# Patient Record
Sex: Female | Born: 1955 | Race: White | Hispanic: No | Marital: Married | Smoking: Never smoker
Health system: Southern US, Community
[De-identification: ages and names within clinical notes are randomized; demographics above are authoritative.]

## PROBLEM LIST (undated history)

## (undated) DIAGNOSIS — D649 Anemia, unspecified: Secondary | ICD-10-CM

## (undated) DIAGNOSIS — I10 Essential (primary) hypertension: Secondary | ICD-10-CM

## (undated) DIAGNOSIS — A159 Respiratory tuberculosis unspecified: Secondary | ICD-10-CM

## (undated) DIAGNOSIS — E119 Type 2 diabetes mellitus without complications: Secondary | ICD-10-CM

## (undated) DIAGNOSIS — R7989 Other specified abnormal findings of blood chemistry: Secondary | ICD-10-CM

## (undated) DIAGNOSIS — F329 Major depressive disorder, single episode, unspecified: Secondary | ICD-10-CM

## (undated) DIAGNOSIS — F32A Depression, unspecified: Secondary | ICD-10-CM

## (undated) DIAGNOSIS — G43909 Migraine, unspecified, not intractable, without status migrainosus: Secondary | ICD-10-CM

## (undated) DIAGNOSIS — T7840XA Allergy, unspecified, initial encounter: Secondary | ICD-10-CM

## (undated) DIAGNOSIS — E7800 Pure hypercholesterolemia, unspecified: Secondary | ICD-10-CM

## (undated) DIAGNOSIS — J329 Chronic sinusitis, unspecified: Secondary | ICD-10-CM

## (undated) DIAGNOSIS — M1990 Unspecified osteoarthritis, unspecified site: Secondary | ICD-10-CM

## (undated) DIAGNOSIS — K760 Fatty (change of) liver, not elsewhere classified: Secondary | ICD-10-CM

## (undated) DIAGNOSIS — G709 Myoneural disorder, unspecified: Secondary | ICD-10-CM

## (undated) DIAGNOSIS — R945 Abnormal results of liver function studies: Secondary | ICD-10-CM

## (undated) HISTORY — DX: Essential (primary) hypertension: I10

## (undated) HISTORY — DX: Abnormal results of liver function studies: R94.5

## (undated) HISTORY — DX: Major depressive disorder, single episode, unspecified: F32.9

## (undated) HISTORY — PX: COSMETIC SURGERY: SHX468

## (undated) HISTORY — PX: BILATERAL CARPAL TUNNEL RELEASE: SHX6508

## (undated) HISTORY — DX: Chronic sinusitis, unspecified: J32.9

## (undated) HISTORY — DX: Allergy, unspecified, initial encounter: T78.40XA

## (undated) HISTORY — DX: Pure hypercholesterolemia, unspecified: E78.00

## (undated) HISTORY — PX: RHINOPLASTY: SUR1284

## (undated) HISTORY — DX: Depression, unspecified: F32.A

## (undated) HISTORY — DX: Myoneural disorder, unspecified: G70.9

## (undated) HISTORY — DX: Migraine, unspecified, not intractable, without status migrainosus: G43.909

## (undated) HISTORY — DX: Other specified abnormal findings of blood chemistry: R79.89

## (undated) HISTORY — DX: Type 2 diabetes mellitus without complications: E11.9

---

## 1995-04-01 HISTORY — PX: TUBAL LIGATION: SHX77

## 2002-03-31 HISTORY — PX: BREAST EXCISIONAL BIOPSY: SUR124

## 2002-03-31 HISTORY — PX: BREAST BIOPSY: SHX20

## 2004-01-15 ENCOUNTER — Ambulatory Visit: Payer: Self-pay | Admitting: Internal Medicine

## 2004-07-17 ENCOUNTER — Ambulatory Visit: Payer: Self-pay | Admitting: Internal Medicine

## 2004-08-29 ENCOUNTER — Ambulatory Visit: Payer: Self-pay | Admitting: Surgery

## 2005-01-15 ENCOUNTER — Ambulatory Visit: Payer: Self-pay | Admitting: Internal Medicine

## 2006-01-19 ENCOUNTER — Ambulatory Visit: Payer: Self-pay | Admitting: Internal Medicine

## 2006-04-18 ENCOUNTER — Ambulatory Visit: Payer: Self-pay | Admitting: Gastroenterology

## 2006-08-05 ENCOUNTER — Ambulatory Visit: Payer: Self-pay | Admitting: Internal Medicine

## 2006-08-26 ENCOUNTER — Ambulatory Visit: Payer: Self-pay | Admitting: Internal Medicine

## 2007-01-27 ENCOUNTER — Ambulatory Visit: Payer: Self-pay | Admitting: Internal Medicine

## 2008-01-28 ENCOUNTER — Ambulatory Visit: Payer: Self-pay | Admitting: Internal Medicine

## 2008-06-12 ENCOUNTER — Ambulatory Visit: Payer: Self-pay | Admitting: Internal Medicine

## 2009-02-08 ENCOUNTER — Ambulatory Visit: Payer: Self-pay | Admitting: Internal Medicine

## 2009-04-27 ENCOUNTER — Ambulatory Visit: Payer: Self-pay | Admitting: Physician Assistant

## 2009-05-17 ENCOUNTER — Encounter: Payer: Self-pay | Admitting: General Practice

## 2009-05-29 ENCOUNTER — Encounter: Payer: Self-pay | Admitting: General Practice

## 2009-06-29 ENCOUNTER — Encounter: Payer: Self-pay | Admitting: General Practice

## 2010-02-11 ENCOUNTER — Ambulatory Visit: Payer: Self-pay | Admitting: Internal Medicine

## 2010-06-17 ENCOUNTER — Ambulatory Visit: Payer: Self-pay | Admitting: General Practice

## 2010-12-11 ENCOUNTER — Ambulatory Visit: Payer: Self-pay | Admitting: Internal Medicine

## 2010-12-25 ENCOUNTER — Ambulatory Visit: Payer: Self-pay | Admitting: Internal Medicine

## 2011-02-19 ENCOUNTER — Ambulatory Visit: Payer: Self-pay | Admitting: Internal Medicine

## 2011-05-12 ENCOUNTER — Ambulatory Visit: Payer: Self-pay | Admitting: General Practice

## 2011-05-30 ENCOUNTER — Ambulatory Visit: Payer: Self-pay | Admitting: General Practice

## 2011-06-30 ENCOUNTER — Ambulatory Visit: Payer: Self-pay | Admitting: General Practice

## 2012-03-18 ENCOUNTER — Encounter: Payer: Self-pay | Admitting: Internal Medicine

## 2012-03-18 ENCOUNTER — Telehealth: Payer: Self-pay | Admitting: Internal Medicine

## 2012-03-18 ENCOUNTER — Ambulatory Visit: Payer: PRIVATE HEALTH INSURANCE | Admitting: Internal Medicine

## 2012-03-18 DIAGNOSIS — E7800 Pure hypercholesterolemia, unspecified: Secondary | ICD-10-CM | POA: Insufficient documentation

## 2012-03-18 DIAGNOSIS — R945 Abnormal results of liver function studies: Secondary | ICD-10-CM | POA: Insufficient documentation

## 2012-03-18 DIAGNOSIS — I10 Essential (primary) hypertension: Secondary | ICD-10-CM | POA: Insufficient documentation

## 2012-03-18 DIAGNOSIS — Z139 Encounter for screening, unspecified: Secondary | ICD-10-CM

## 2012-03-18 DIAGNOSIS — G43909 Migraine, unspecified, not intractable, without status migrainosus: Secondary | ICD-10-CM | POA: Insufficient documentation

## 2012-03-18 NOTE — Telephone Encounter (Signed)
Needing an order for a mammogram. °

## 2012-03-18 NOTE — Progress Notes (Signed)
  Subjective:    [name], female    DOB: Dec 13, 1955, 56 y.o.   MRN: [id number]  HPI   Review of Systems     Objective:   Physical Exam        Assessment & Plan:  Chart opened only for abstraction.

## 2012-03-18 NOTE — Assessment & Plan Note (Signed)
Evaluated by Dr Bluford Kaufmann.  Ultrasound with probable fatty liver.  CT with fatty liver.

## 2012-03-18 NOTE — Assessment & Plan Note (Signed)
Stable.  Takes Axert prn.

## 2012-03-19 NOTE — Telephone Encounter (Signed)
mammo ordered.

## 2012-03-22 ENCOUNTER — Ambulatory Visit: Payer: Self-pay | Admitting: Internal Medicine

## 2012-04-06 ENCOUNTER — Encounter: Payer: Self-pay | Admitting: Internal Medicine

## 2012-04-06 ENCOUNTER — Ambulatory Visit (INDEPENDENT_AMBULATORY_CARE_PROVIDER_SITE_OTHER): Payer: 59 | Admitting: Internal Medicine

## 2012-04-06 VITALS — BP 110/78 | HR 82 | Temp 98.0°F | Ht 62.25 in | Wt 178.5 lb

## 2012-04-06 DIAGNOSIS — E7800 Pure hypercholesterolemia, unspecified: Secondary | ICD-10-CM

## 2012-04-06 DIAGNOSIS — I10 Essential (primary) hypertension: Secondary | ICD-10-CM

## 2012-04-06 DIAGNOSIS — R945 Abnormal results of liver function studies: Secondary | ICD-10-CM

## 2012-04-06 DIAGNOSIS — G43909 Migraine, unspecified, not intractable, without status migrainosus: Secondary | ICD-10-CM

## 2012-04-06 DIAGNOSIS — R7989 Other specified abnormal findings of blood chemistry: Secondary | ICD-10-CM

## 2012-04-06 DIAGNOSIS — R5383 Other fatigue: Secondary | ICD-10-CM

## 2012-04-07 LAB — CBC WITH DIFFERENTIAL/PLATELET
Basophils Relative: 0.4 % (ref 0.0–3.0)
Eosinophils Absolute: 0.2 10*3/uL (ref 0.0–0.7)
Eosinophils Relative: 3.7 % (ref 0.0–5.0)
HCT: 34.8 % — ABNORMAL LOW (ref 36.0–46.0)
Lymphs Abs: 1.8 10*3/uL (ref 0.7–4.0)
MCHC: 34.5 g/dL (ref 30.0–36.0)
MCV: 86.6 fl (ref 78.0–100.0)
Monocytes Absolute: 0.6 10*3/uL (ref 0.1–1.0)
Neutrophils Relative %: 53.4 % (ref 43.0–77.0)
RBC: 4.01 Mil/uL (ref 3.87–5.11)
WBC: 5.6 10*3/uL (ref 4.5–10.5)

## 2012-04-07 LAB — HEPATIC FUNCTION PANEL
AST: 92 U/L — ABNORMAL HIGH (ref 0–37)
Albumin: 4 g/dL (ref 3.5–5.2)
Total Bilirubin: 1 mg/dL (ref 0.3–1.2)

## 2012-04-07 LAB — BASIC METABOLIC PANEL
CO2: 26 mEq/L (ref 19–32)
Calcium: 9.7 mg/dL (ref 8.4–10.5)
Chloride: 105 mEq/L (ref 96–112)
Glucose, Bld: 89 mg/dL (ref 70–99)
Sodium: 139 mEq/L (ref 135–145)

## 2012-04-07 LAB — LIPID PANEL
Cholesterol: 205 mg/dL — ABNORMAL HIGH (ref 0–200)
HDL: 33 mg/dL — ABNORMAL LOW (ref >39.00)
Total CHOL/HDL Ratio: 6
Triglycerides: 218 mg/dL — ABNORMAL HIGH (ref 0.0–149.0)
VLDL: 43.6 mg/dL — ABNORMAL HIGH (ref 0.0–40.0)

## 2012-04-07 LAB — LDL CHOLESTEROL, DIRECT: Direct LDL: 117.9 mg/dL

## 2012-04-08 ENCOUNTER — Telehealth: Payer: Self-pay | Admitting: Internal Medicine

## 2012-04-08 NOTE — Telephone Encounter (Signed)
Pt says her pharmacy still has not received her rx.

## 2012-04-09 ENCOUNTER — Other Ambulatory Visit: Payer: Self-pay | Admitting: *Deleted

## 2012-04-09 MED ORDER — AMLODIPINE BESYLATE 10 MG PO TABS: 10.0000 mg | ORAL_TABLET | Freq: Every day | ORAL | Status: DC

## 2012-04-09 MED ORDER — LISINOPRIL 40 MG PO TABS: 40.0000 mg | ORAL_TABLET | Freq: Every day | ORAL | Status: DC

## 2012-04-09 MED ORDER — HYDROCHLOROTHIAZIDE 25 MG PO TABS: 25.0000 mg | ORAL_TABLET | Freq: Every day | ORAL | Status: DC

## 2012-04-09 MED ORDER — PAROXETINE HCL 10 MG PO TABS: 10.0000 mg | ORAL_TABLET | Freq: Every day | ORAL | Status: DC

## 2012-04-09 NOTE — Telephone Encounter (Signed)
Scripts filled.

## 2012-04-17 ENCOUNTER — Encounter: Payer: Self-pay | Admitting: Internal Medicine

## 2012-04-17 NOTE — Assessment & Plan Note (Signed)
On lovaza.  Check lipid panel.  Low cholesterol/low fat diet.  Follow.   

## 2012-04-17 NOTE — Assessment & Plan Note (Signed)
Takes Axert.  Stable.

## 2012-04-17 NOTE — Assessment & Plan Note (Signed)
Worked up by Dr Bluford Kaufmann.  See his notes for details.  Ultrasound revealed probable fatty liver.  CT with fatty liver.  Discussed need for diet, exercise and weight loss.  Continue follow up with GI.  Continue Lovaza for now.  Recheck liver panel.

## 2012-04-17 NOTE — Assessment & Plan Note (Signed)
Blood pressure has been under good control.  Same meds.  Check metabolic panel.   

## 2012-04-17 NOTE — Progress Notes (Signed)
  Subjective:    [name], female    DOB: 1955/09/15, 57 y.o.   MRN: [id number]  HPI 57 year old female with past history of hypertension, hypercholesterolemia, reoccurring allergy and sinus problems and abnormal liver function who comes in today for a scheduled follow up.  She states she has been doing well.  Handling stress well.  No cardiac symptoms with increased activity or exertion.  Breathing stable.  Bowels stable.  States her blood pressure has been doing well.    Past Medical History  Diagnosis Date  . Hypertension   . Hypercholesterolemia   . Migraine headache   . Recurrent sinusitis     allergies  . Abnormal liver function tests     Current Outpatient Prescriptions on File Prior to Visit  Medication Sig Dispense Refill  . fexofenadine (ALLEGRA) 180 MG tablet Take 180 mg by mouth as needed.      . fluticasone (FLONASE) 50 MCG/ACT nasal spray Place 2 sprays into the nose daily.      Marland Kitchen omega-3 acid ethyl esters (LOVAZA) 1 G capsule Take 2 g by mouth 4 (four) times daily.      Marland Kitchen amLODipine (NORVASC) 10 MG tablet Take 1 tablet (10 mg total) by mouth daily.  30 tablet  6  . hydrochlorothiazide (HYDRODIURIL) 25 MG tablet Take 1 tablet (25 mg total) by mouth daily.  30 tablet  6  . lisinopril (PRINIVIL,ZESTRIL) 40 MG tablet Take 1 tablet (40 mg total) by mouth daily.  30 tablet  6  . PARoxetine (PAXIL) 10 MG tablet Take 1 tablet (10 mg total) by mouth daily.  30 tablet  3    Review of Systems Patient denies any headache, lightheadedness or dizziness.  No significant sinus or allergy symptoms currently.  No chest pain, tightness or palpitations.  No increased shortness of breath, cough or congestion.  No nausea or vomiting.  No abdominal pain or cramping.  No bowel change, such as diarrhea, constipation, BRBPR or melana.  No urine change.  Overall she feel she is doing well.  Handling stress well.       Objective:   Physical Exam Filed Vitals:   04/06/12 1617  BP:  110/78  Pulse: 82  Temp: 98 F (36.7 C)   Blood pressure recheck:  114/78, pulse 2  57 year old female in no acute distress.   HEENT:  Nares - clear.  OP- without lesions or erythema.  NECK:  Supple, nontender.  No audible bruit.   HEART:  Appears to be regular. LUNGS:  Without crackles or wheezing audible.  Respirations even and unlabored.   RADIAL PULSE:  Equal bilaterally.  ABDOMEN:  Soft, nontender.  No audible abdominal bruit.   EXTREMITIES:  No increased edema to be present.                  Assessment & Plan:  INCREASED PSYCHOSOCIAL STRESSORS.  Doing well.  On Paxil.  Follow.    CARDIOVASCULAR.  Continue risk factor modification.  Asymptomatic.   HEALTH MAINTENANCE.  Physical 04/23/11.  Mammogram 02/19/11 - BiRADS II.  Was scheduled a follow up mammogram.  Colonoscopy 1/08 - normal.  Hemoccult cards 05/12/11 - negative.

## 2012-04-19 ENCOUNTER — Other Ambulatory Visit: Payer: Self-pay | Admitting: Internal Medicine

## 2012-04-19 DIAGNOSIS — R945 Abnormal results of liver function studies: Secondary | ICD-10-CM

## 2012-04-19 NOTE — Progress Notes (Signed)
Order placed for referral to gastro

## 2012-04-27 ENCOUNTER — Encounter: Payer: Self-pay | Admitting: Internal Medicine

## 2012-05-17 ENCOUNTER — Ambulatory Visit: Payer: Self-pay | Admitting: Gastroenterology

## 2012-07-12 ENCOUNTER — Ambulatory Visit (INDEPENDENT_AMBULATORY_CARE_PROVIDER_SITE_OTHER): Payer: 59 | Admitting: Internal Medicine

## 2012-07-12 ENCOUNTER — Encounter: Payer: Self-pay | Admitting: Internal Medicine

## 2012-07-12 ENCOUNTER — Other Ambulatory Visit (HOSPITAL_COMMUNITY)
Admission: RE | Admit: 2012-07-12 | Discharge: 2012-07-12 | Disposition: A | Discharge status: Home / Self Care | Payer: 59 | Source: Ambulatory Visit | Attending: Internal Medicine | Admitting: Internal Medicine

## 2012-07-12 VITALS — BP 120/80 | HR 76 | Temp 98.4°F | Ht 62.25 in | Wt 182.5 lb

## 2012-07-12 DIAGNOSIS — Z124 Encounter for screening for malignant neoplasm of cervix: Secondary | ICD-10-CM

## 2012-07-12 DIAGNOSIS — G43909 Migraine, unspecified, not intractable, without status migrainosus: Secondary | ICD-10-CM

## 2012-07-12 DIAGNOSIS — Z01419 Encounter for gynecological examination (general) (routine) without abnormal findings: Secondary | ICD-10-CM | POA: Insufficient documentation

## 2012-07-12 DIAGNOSIS — E7800 Pure hypercholesterolemia, unspecified: Secondary | ICD-10-CM

## 2012-07-12 DIAGNOSIS — R7989 Other specified abnormal findings of blood chemistry: Secondary | ICD-10-CM

## 2012-07-12 DIAGNOSIS — N950 Postmenopausal bleeding: Secondary | ICD-10-CM

## 2012-07-12 DIAGNOSIS — R945 Abnormal results of liver function studies: Secondary | ICD-10-CM

## 2012-07-12 DIAGNOSIS — Z1151 Encounter for screening for human papillomavirus (HPV): Secondary | ICD-10-CM | POA: Insufficient documentation

## 2012-07-12 DIAGNOSIS — I10 Essential (primary) hypertension: Secondary | ICD-10-CM

## 2012-07-13 ENCOUNTER — Encounter: Payer: Self-pay | Admitting: Internal Medicine

## 2012-07-13 DIAGNOSIS — N950 Postmenopausal bleeding: Secondary | ICD-10-CM | POA: Insufficient documentation

## 2012-07-13 NOTE — Assessment & Plan Note (Signed)
Blood pressure has been under good control.  Same meds.  Check metabolic panel.   

## 2012-07-13 NOTE — Assessment & Plan Note (Signed)
Stable.  Takes Axert prn.

## 2012-07-13 NOTE — Assessment & Plan Note (Signed)
Worked up by Dr Bluford Kaufmann.  See his notes for details.  Ultrasound revealed probable fatty liver.  CT with fatty liver.  Discussed need for diet, exercise and weight loss.  Continue follow up with GI.  Continue Lovaza for now.  Recheck liver panel.

## 2012-07-13 NOTE — Assessment & Plan Note (Signed)
On lovaza.  Check lipid panel.  Low cholesterol/low fat diet.  Follow.   

## 2012-07-13 NOTE — Assessment & Plan Note (Signed)
Bleeding as outlined.  Had been more than one year since a period.  Had a light period which lasted approximately four days.  Will refer to GYN for evaluation.  Appointment with Dr Janene Harvey.

## 2012-07-13 NOTE — Progress Notes (Signed)
Subjective:    [name], female    DOB: 08-15-1955, 57 y.o.   MRN: [id number]  HPI 57 year old female with past history of hypertension, hypercholesterolemia, reoccurring allergy and sinus problems and abnormal liver function who comes in today to follow up on these issues as well as for a complete physical exam.  She states she has been doing well.  Handling stress well.  No cardiac symptoms with increased activity or exertion.  Breathing stable.  Bowels stable.  States her blood pressure has been doing well - 110-120/70-80.  She reports that it had been more than one year since she had a period.  Approximately 1 1/2 weeks ago, she had a light period that lasted approximately four days.  No bleeding since.  Just saw GI 2/14. Had follow up ultrasound.  States stable.  Recommended continuing to follow.    Past Medical History  Diagnosis Date  . Hypertension   . Hypercholesterolemia   . Migraine headache   . Recurrent sinusitis     allergies  . Abnormal liver function tests     Current Outpatient Prescriptions on File Prior to Visit  Medication Sig Dispense Refill  . amLODipine (NORVASC) 10 MG tablet Take 1 tablet (10 mg total) by mouth daily.  30 tablet  6  . fluticasone (FLONASE) 50 MCG/ACT nasal spray Place 2 sprays into the nose daily.      . hydrochlorothiazide (HYDRODIURIL) 25 MG tablet Take 1 tablet (25 mg total) by mouth daily.  30 tablet  6  . lisinopril (PRINIVIL,ZESTRIL) 40 MG tablet Take 1 tablet (40 mg total) by mouth daily.  30 tablet  6  . omega-3 acid ethyl esters (LOVAZA) 1 G capsule Take 2 g by mouth 4 (four) times daily.      Marland Kitchen PARoxetine (PAXIL) 10 MG tablet Take 1 tablet (10 mg total) by mouth daily.  30 tablet  3  . fexofenadine (ALLEGRA) 180 MG tablet Take 180 mg by mouth as needed.       No current facility-administered medications on file prior to visit.    Review of Systems Patient denies any headache, lightheadedness or dizziness.  No  significant sinus or allergy symptoms currently.  No chest pain, tightness or palpitations.  No increased shortness of breath, cough or congestion.  No nausea or vomiting.  No abdominal pain or cramping.  No bowel change, such as diarrhea, constipation, BRBPR or melana.  No urine change.  Overall she feel she is doing well.  Handling stress well.  Does report the vaginal bleeding as outlined.       Objective:   Physical Exam  Filed Vitals:   07/12/12 1035  BP: 120/80  Pulse: 76  Temp: 98.4 F (36.9 C)   Blood pressure recheck:  116/78, pulse 86  57 year old female in no acute distress.   HEENT:  Nares- clear.  Oropharynx - without lesions. NECK:  Supple.  Nontender.  No audible bruit.  HEART:  Appears to be regular. LUNGS:  No crackles or wheezing audible.  Respirations even and unlabored.  RADIAL PULSE:  Equal bilaterally.    BREASTS:  No nipple discharge or nipple retraction present.  Could not appreciate any distinct nodules or axillary adenopathy.  ABDOMEN:  Soft, nontender.  Bowel sounds present and normal.  No audible abdominal bruit.  GU:  Normal external genitalia.  Vaginal vault without lesions.  Cervix identified.  Pap performed. Some minimal bleeding with the pap.  Could not  appreciate any adnexal masses or tenderness.   RECTAL:  No masses palpated.    EXTREMITIES:  No increased edema present.  DP pulses palpable and equal bilaterally.          Assessment & Plan:  INCREASED PSYCHOSOCIAL STRESSORS.  Doing well.  On Paxil.  Follow.    CARDIOVASCULAR.  Continue risk factor modification.  Asymptomatic.   HEALTH MAINTENANCE.  Physical today.  Mammogram 03/22/12 - BiRADS II.  Colonoscopy 1/08 - normal.

## 2012-07-19 ENCOUNTER — Encounter: Payer: Self-pay | Admitting: Internal Medicine

## 2012-08-09 ENCOUNTER — Other Ambulatory Visit (INDEPENDENT_AMBULATORY_CARE_PROVIDER_SITE_OTHER): Payer: 59

## 2012-08-09 DIAGNOSIS — I10 Essential (primary) hypertension: Secondary | ICD-10-CM

## 2012-08-09 DIAGNOSIS — E7800 Pure hypercholesterolemia, unspecified: Secondary | ICD-10-CM

## 2012-08-09 DIAGNOSIS — R945 Abnormal results of liver function studies: Secondary | ICD-10-CM

## 2012-08-09 DIAGNOSIS — R7989 Other specified abnormal findings of blood chemistry: Secondary | ICD-10-CM

## 2012-08-09 LAB — HEPATIC FUNCTION PANEL
Albumin: 4.2 g/dL (ref 3.5–5.2)
Alkaline Phosphatase: 60 U/L (ref 39–117)
Total Bilirubin: 0.4 mg/dL (ref 0.3–1.2)

## 2012-08-09 LAB — BASIC METABOLIC PANEL
BUN: 17 mg/dL (ref 6–23)
CO2: 26 mEq/L (ref 19–32)
Chloride: 105 mEq/L (ref 96–112)
Glucose, Bld: 109 mg/dL — ABNORMAL HIGH (ref 70–99)
Potassium: 4.2 mEq/L (ref 3.5–5.1)

## 2012-08-09 LAB — LIPID PANEL
Cholesterol: 199 mg/dL (ref 0–200)
Total CHOL/HDL Ratio: 6
VLDL: 66 mg/dL — ABNORMAL HIGH (ref 0.0–40.0)

## 2012-08-12 ENCOUNTER — Telehealth: Payer: Self-pay | Admitting: Internal Medicine

## 2012-08-12 ENCOUNTER — Encounter: Payer: Self-pay | Admitting: Internal Medicine

## 2012-08-12 NOTE — Telephone Encounter (Signed)
Pt was notified of lab results via my chart.  She needs a fasting lab appt within the next few weeks.  Please schedule her for a fasting lab in a few weeks and call her with an appt date and time.  Thanks.

## 2012-08-13 ENCOUNTER — Telehealth: Payer: Self-pay | Admitting: *Deleted

## 2012-08-13 DIAGNOSIS — R739 Hyperglycemia, unspecified: Secondary | ICD-10-CM

## 2012-08-13 NOTE — Telephone Encounter (Signed)
Patient scheduled for fasting labs 5/19

## 2012-08-13 NOTE — Telephone Encounter (Signed)
Left message for pt to call office for pt to call back

## 2012-08-13 NOTE — Telephone Encounter (Signed)
Pt is coming in for labs Monday 05.19.2014 what labs and dx?   

## 2012-08-13 NOTE — Telephone Encounter (Signed)
Ordered a1c and fasting glucose

## 2012-08-16 ENCOUNTER — Other Ambulatory Visit: Payer: 59

## 2012-08-19 ENCOUNTER — Other Ambulatory Visit (INDEPENDENT_AMBULATORY_CARE_PROVIDER_SITE_OTHER): Payer: 59

## 2012-08-19 DIAGNOSIS — R739 Hyperglycemia, unspecified: Secondary | ICD-10-CM

## 2012-08-19 DIAGNOSIS — R7309 Other abnormal glucose: Secondary | ICD-10-CM

## 2012-08-22 ENCOUNTER — Encounter: Payer: Self-pay | Admitting: Internal Medicine

## 2012-08-24 ENCOUNTER — Telehealth: Payer: Self-pay

## 2012-08-24 NOTE — Telephone Encounter (Signed)
MyChart Message: I reviewed your recent lab results. Your sugar and the overall sugar control (a1c) - are ok. Continue low carb diet and exercise.  Notified patient of her lab results.

## 2012-09-10 ENCOUNTER — Telehealth: Payer: Self-pay | Admitting: *Deleted

## 2012-09-10 MED ORDER — OMEGA-3-ACID ETHYL ESTERS 1 G PO CAPS: 1.0000 g | ORAL_CAPSULE | Freq: Four times a day (QID) | ORAL | Status: DC

## 2012-09-10 NOTE — Telephone Encounter (Signed)
Patient called stating needs refill on Lovaza from historical provider please advise as to refill.

## 2012-09-10 NOTE — Telephone Encounter (Signed)
Refilled lovaza x 5 months.

## 2012-11-15 ENCOUNTER — Ambulatory Visit (INDEPENDENT_AMBULATORY_CARE_PROVIDER_SITE_OTHER): Payer: 59 | Admitting: Internal Medicine

## 2012-11-15 ENCOUNTER — Encounter: Payer: Self-pay | Admitting: Internal Medicine

## 2012-11-15 VITALS — BP 130/80 | HR 81 | Temp 98.4°F | Ht 62.25 in | Wt 182.8 lb

## 2012-11-15 DIAGNOSIS — I10 Essential (primary) hypertension: Secondary | ICD-10-CM

## 2012-11-15 DIAGNOSIS — R945 Abnormal results of liver function studies: Secondary | ICD-10-CM

## 2012-11-15 DIAGNOSIS — N950 Postmenopausal bleeding: Secondary | ICD-10-CM

## 2012-11-15 DIAGNOSIS — R739 Hyperglycemia, unspecified: Secondary | ICD-10-CM

## 2012-11-15 DIAGNOSIS — R7309 Other abnormal glucose: Secondary | ICD-10-CM

## 2012-11-15 DIAGNOSIS — G43909 Migraine, unspecified, not intractable, without status migrainosus: Secondary | ICD-10-CM

## 2012-11-15 DIAGNOSIS — R7989 Other specified abnormal findings of blood chemistry: Secondary | ICD-10-CM

## 2012-11-15 DIAGNOSIS — E7800 Pure hypercholesterolemia, unspecified: Secondary | ICD-10-CM

## 2012-11-15 LAB — BASIC METABOLIC PANEL
BUN: 20 mg/dL (ref 6–23)
CO2: 23 mEq/L (ref 19–32)
Calcium: 9.4 mg/dL (ref 8.4–10.5)
Creatinine, Ser: 0.8 mg/dL (ref 0.4–1.2)
Glucose, Bld: 99 mg/dL (ref 70–99)

## 2012-11-15 LAB — HEMOGLOBIN A1C: Hgb A1c MFr Bld: 5.8 % (ref 4.6–6.5)

## 2012-11-15 LAB — LIPID PANEL: Total CHOL/HDL Ratio: 6

## 2012-11-15 NOTE — Assessment & Plan Note (Addendum)
Stable

## 2012-11-15 NOTE — Assessment & Plan Note (Signed)
Blood pressure has been under good control.  Same meds.  Follow metabolic panel.   

## 2012-11-15 NOTE — Progress Notes (Signed)
Subjective:    [name], female    DOB: 06/28/1955, 57 y.o.   MRN: [id number]  HPI 57 year old female with past history of hypertension, hypercholesterolemia, reoccurring allergy and sinus problems and abnormal liver function who comes in today for a scheduled follow up.  She states she has been doing well.  Handling stress well.  No cardiac symptoms with increased activity or exertion.  Breathing stable.  Bowels stable.  States her blood pressure has been doing well - 120-130s/70-80s.   Saw GI 2/14. Had follow up ultrasound.  States stable.  Recommended continuing to follow.  Just had liver panel checked through GI last week.  She did see gyn for post menopausal bleeding.  See last note for details.  Had a polyp removed.  Has had no further bleeding.     Past Medical History  Diagnosis Date  . Hypertension   . Hypercholesterolemia   . Migraine headache   . Recurrent sinusitis     allergies  . Abnormal liver function tests     Current Outpatient Prescriptions on File Prior to Visit  Medication Sig Dispense Refill  . amLODipine (NORVASC) 10 MG tablet Take 1 tablet (10 mg total) by mouth daily.  30 tablet  6  . fexofenadine (ALLEGRA) 180 MG tablet Take 180 mg by mouth as needed.      . fluticasone (FLONASE) 50 MCG/ACT nasal spray Place 2 sprays into the nose daily.      . hydrochlorothiazide (HYDRODIURIL) 25 MG tablet Take 1 tablet (25 mg total) by mouth daily.  30 tablet  6  . lisinopril (PRINIVIL,ZESTRIL) 40 MG tablet Take 1 tablet (40 mg total) by mouth daily.  30 tablet  6  . loratadine (CLARITIN) 10 MG tablet Take 10 mg by mouth daily.      Marland Kitchen PARoxetine (PAXIL) 10 MG tablet Take 1 tablet (10 mg total) by mouth daily.  30 tablet  3   No current facility-administered medications on file prior to visit.    Review of Systems Patient denies any headache, lightheadedness or dizziness.  No significant sinus or allergy symptoms currently.  No chest pain, tightness or  palpitations.  No increased shortness of breath, cough or congestion.  No nausea or vomiting.  No abdominal pain or cramping.  No bowel change, such as diarrhea, constipation, BRBPR or melana.  No urine change.  Overall she feel she is doing well.  Handling stress well.  No further vaginal bleeding.       Objective:   Physical Exam  Filed Vitals:   11/15/12 0910  BP: 130/80  Pulse: 81  Temp: 98.4 F (2.60 C)   57 year old female in no acute distress.   HEENT:  Nares- clear.  Oropharynx - without lesions. NECK:  Supple.  Nontender.  No audible bruit.  HEART:  Appears to be regular. LUNGS:  No crackles or wheezing audible.  Respirations even and unlabored.  RADIAL PULSE:  Equal bilaterally.  ABDOMEN:  Soft, nontender.  Bowel sounds present and normal.  No audible abdominal bruit.    EXTREMITIES:  No increased edema present.  DP pulses palpable and equal bilaterally.          Assessment & Plan:  INCREASED PSYCHOSOCIAL STRESSORS.  Doing well.  On Paxil.  Follow.    CARDIOVASCULAR.  Continue risk factor modification.  Asymptomatic.   HEALTH MAINTENANCE.  Physical 07/12/12.  Pap at physical wnl.  Mammogram 03/22/12 - BiRADS II.  Colonoscopy 1/08 -  normal.

## 2012-11-15 NOTE — Assessment & Plan Note (Addendum)
Worked up by Dr Bluford Kaufmann.  See his notes for details.  Ultrasound revealed probable fatty liver.  CT with fatty liver.  Have discussed need for diet, exercise and weight loss.  Continue follow up with GI.  Continue Lovaza for now.  Just had liver panel checked last week.  Obtain results.

## 2012-11-15 NOTE — Assessment & Plan Note (Signed)
On lovaza.  Check lipid panel.  Low cholesterol/low fat diet.  Follow.   

## 2012-11-15 NOTE — Assessment & Plan Note (Addendum)
Saw Dr Janene Harvey.  Had polyp removed.  No further bleeding.  Obtain records.

## 2012-11-16 ENCOUNTER — Encounter: Payer: Self-pay | Admitting: Internal Medicine

## 2012-11-21 ENCOUNTER — Encounter: Payer: Self-pay | Admitting: Internal Medicine

## 2012-11-23 NOTE — Telephone Encounter (Signed)
Mailed unread message to pt  

## 2012-12-16 ENCOUNTER — Ambulatory Visit: Payer: Self-pay | Admitting: Gastroenterology

## 2013-01-24 ENCOUNTER — Other Ambulatory Visit: Payer: Self-pay | Admitting: *Deleted

## 2013-01-24 ENCOUNTER — Encounter: Payer: Self-pay | Admitting: Internal Medicine

## 2013-01-24 MED ORDER — LISINOPRIL 40 MG PO TABS: 40.0000 mg | ORAL_TABLET | Freq: Every day | ORAL | Status: DC

## 2013-01-24 MED ORDER — PAROXETINE HCL 10 MG PO TABS: 10.0000 mg | ORAL_TABLET | Freq: Every day | ORAL | Status: DC

## 2013-02-03 ENCOUNTER — Other Ambulatory Visit: Payer: Self-pay

## 2013-02-19 ENCOUNTER — Encounter: Payer: Self-pay | Admitting: Internal Medicine

## 2013-02-23 ENCOUNTER — Encounter: Payer: Self-pay | Admitting: Internal Medicine

## 2013-03-11 ENCOUNTER — Encounter: Payer: Self-pay | Admitting: *Deleted

## 2013-03-14 ENCOUNTER — Ambulatory Visit (INDEPENDENT_AMBULATORY_CARE_PROVIDER_SITE_OTHER): Payer: 59 | Admitting: Internal Medicine

## 2013-03-14 ENCOUNTER — Encounter: Payer: Self-pay | Admitting: Internal Medicine

## 2013-03-14 ENCOUNTER — Other Ambulatory Visit: Payer: Self-pay | Admitting: Internal Medicine

## 2013-03-14 VITALS — BP 130/90 | HR 73 | Temp 98.0°F | Ht 62.25 in | Wt 182.8 lb

## 2013-03-14 DIAGNOSIS — R945 Abnormal results of liver function studies: Secondary | ICD-10-CM

## 2013-03-14 DIAGNOSIS — G43909 Migraine, unspecified, not intractable, without status migrainosus: Secondary | ICD-10-CM

## 2013-03-14 DIAGNOSIS — I10 Essential (primary) hypertension: Secondary | ICD-10-CM

## 2013-03-14 DIAGNOSIS — R739 Hyperglycemia, unspecified: Secondary | ICD-10-CM

## 2013-03-14 DIAGNOSIS — R7989 Other specified abnormal findings of blood chemistry: Secondary | ICD-10-CM

## 2013-03-14 DIAGNOSIS — N950 Postmenopausal bleeding: Secondary | ICD-10-CM

## 2013-03-14 DIAGNOSIS — R7309 Other abnormal glucose: Secondary | ICD-10-CM

## 2013-03-14 DIAGNOSIS — E7800 Pure hypercholesterolemia, unspecified: Secondary | ICD-10-CM

## 2013-03-14 LAB — BASIC METABOLIC PANEL
BUN: 15 mg/dL (ref 6–23)
CO2: 25 mEq/L (ref 19–32)
Calcium: 10 mg/dL (ref 8.4–10.5)
Chloride: 104 mEq/L (ref 96–112)
Glucose, Bld: 97 mg/dL (ref 70–99)
Potassium: 3.8 mEq/L (ref 3.5–5.1)
Sodium: 139 mEq/L (ref 135–145)

## 2013-03-14 LAB — LIPID PANEL
HDL: 37.5 mg/dL — ABNORMAL LOW (ref >39.00)
Triglycerides: 145 mg/dL (ref 0.0–149.0)
VLDL: 29 mg/dL (ref 0.0–40.0)

## 2013-03-14 MED ORDER — LISINOPRIL 40 MG PO TABS: 40.0000 mg | ORAL_TABLET | Freq: Every day | ORAL | Status: DC

## 2013-03-14 MED ORDER — HYDROCHLOROTHIAZIDE 25 MG PO TABS: 25.0000 mg | ORAL_TABLET | Freq: Every day | ORAL | Status: DC

## 2013-03-14 NOTE — Progress Notes (Signed)
Subjective:    [name], female    DOB: [birth year], 57 y.o.   MRN: [id number]  HPI 57 year old female with past history of hypertension, hypercholesterolemia, reoccurring allergy and sinus problems and abnormal liver function who comes in today for a scheduled follow up.  She states she has been doing well.  Handling stress well.  No cardiac symptoms with increased activity or exertion.  Breathing stable.  Bowels stable.  States her blood pressure has been doing well.  Blood pressure averaging 120-130/low 80s.  Just saw GI.  Had follow up labs.  States stable.  Recommended continuing to follow.  Will need f/u ultrasound yearly.  States her last was in 8/14.  She did see gyn for post menopausal bleeding.  See previous notes for details.  Had a polyp removed.  Has had no further bleeding.  Just saw Dr Dorcas Mcmurray secondary to some left eyelid swelling.  He gave her steroid eye drops.  Improved.  Previously had pain in her left thumb.  Took advil for two days.  Resolved.  No problems now.  Overall she feels she is doing well.     Past Medical History  Diagnosis Date  . Hypertension   . Hypercholesterolemia   . Migraine headache   . Recurrent sinusitis     allergies  . Abnormal liver function tests     Current Outpatient Prescriptions on File Prior to Visit  Medication Sig Dispense Refill  . amLODipine (NORVASC) 10 MG tablet Take 1 tablet (10 mg total) by mouth daily.  30 tablet  6  . fexofenadine (ALLEGRA) 180 MG tablet Take 180 mg by mouth as needed.      . fluticasone (FLONASE) 50 MCG/ACT nasal spray Place 2 sprays into the nose daily.      . hydrochlorothiazide (HYDRODIURIL) 25 MG tablet Take 1 tablet (25 mg total) by mouth daily.  30 tablet  6  . lisinopril (PRINIVIL,ZESTRIL) 40 MG tablet Take 1 tablet (40 mg total) by mouth daily.  30 tablet  5  . loratadine (CLARITIN) 10 MG tablet Take 10 mg by mouth daily.      . naproxen sodium (ANAPROX) 220 MG tablet Take 220 mg by  mouth as needed.      Marland Kitchen PARoxetine (PAXIL) 10 MG tablet Take 1 tablet (10 mg total) by mouth daily.  30 tablet  2  . omega-3 acid ethyl esters (LOVAZA) 1 G capsule Take 1 g by mouth 2 (two) times daily.       No current facility-administered medications on file prior to visit.    Review of Systems Patient denies any headache, lightheadedness or dizziness.  No significant sinus or allergy symptoms currently.  No chest pain, tightness or palpitations.  No increased shortness of breath, cough or congestion.  No nausea or vomiting.  No abdominal pain or cramping.  No bowel change, such as diarrhea, constipation, BRBPR or melana.  No urine change.  Overall she feel she is doing well.  Handling stress well.  No further vaginal bleeding.       Objective:   Physical Exam  Filed Vitals:   03/14/13 0904  BP: 130/90  Pulse: 73  Temp: 98 F (36.7 C)   Blood pressure recheck:  59/70  57 year old female in no acute distress.   HEENT:  Nares- clear.  Oropharynx - without lesions. NECK:  Supple.  Nontender.  No audible bruit.  HEART:  Appears to be regular. LUNGS:  No crackles  or wheezing audible.  Respirations even and unlabored.  RADIAL PULSE:  Equal bilaterally.  ABDOMEN:  Soft, nontender.  Bowel sounds present and normal.  No audible abdominal bruit.    EXTREMITIES:  No increased edema present.  DP pulses palpable and equal bilaterally.          Assessment & Plan:  INCREASED PSYCHOSOCIAL STRESSORS.  Doing well.  On Paxil.  Follow.    CARDIOVASCULAR.  Continue risk factor modification.  Asymptomatic.   HEALTH MAINTENANCE.  Physical 07/12/12.  Pap at physical wnl.  Mammogram 03/22/12 - BiRADS II.  She is scheduled to have her mammogram 03/22/13.   Colonoscopy 1/08 - normal.

## 2013-03-14 NOTE — Assessment & Plan Note (Addendum)
Blood pressure has been under good control.  Same meds.  Check metabolic panel.   

## 2013-03-14 NOTE — Progress Notes (Signed)
Orders placed for labs

## 2013-03-14 NOTE — Assessment & Plan Note (Addendum)
On lovaza.  Not taking regularly.  Check lipid panel.  Low cholesterol/low fat diet.  Follow.

## 2013-03-14 NOTE — Assessment & Plan Note (Addendum)
Saw Dr Klett.  Had polyp removed.  No further bleeding.   

## 2013-03-14 NOTE — Assessment & Plan Note (Addendum)
Stable

## 2013-03-14 NOTE — Progress Notes (Signed)
Pre-visit discussion using our clinic review tool. No additional management support is needed unless otherwise documented below in the visit note.  

## 2013-03-14 NOTE — Assessment & Plan Note (Addendum)
Worked up by Dr Bluford Kaufmann.  See his notes for details.  Ultrasound revealed probable fatty liver.  CT with fatty liver.  Discussed need for diet, exercise and weight loss.  Continue follow up with GI.  Just evaluated by GI last week.  Apparently labs stable.  Will need ultrasound yearly.

## 2013-03-16 NOTE — Telephone Encounter (Signed)
Mailed unread message to pt  

## 2013-03-17 ENCOUNTER — Encounter: Payer: Self-pay | Admitting: *Deleted

## 2013-03-23 ENCOUNTER — Ambulatory Visit: Payer: Self-pay | Admitting: Internal Medicine

## 2013-03-23 ENCOUNTER — Encounter: Payer: Self-pay | Admitting: Internal Medicine

## 2013-03-23 LAB — HM MAMMOGRAPHY: HM Mammogram: NEGATIVE

## 2013-04-14 ENCOUNTER — Encounter: Payer: Self-pay | Admitting: Internal Medicine

## 2013-05-03 ENCOUNTER — Other Ambulatory Visit: Payer: Self-pay | Admitting: Internal Medicine

## 2013-05-18 ENCOUNTER — Telehealth: Payer: Self-pay | Admitting: Internal Medicine

## 2013-05-18 NOTE — Telephone Encounter (Signed)
Pt left vm regarding billing issue.  Returned pt call, daughter answered, heard daughter talking to pt.  Pt told daughter to tell me she was not there.  Daughter told me pt was not there.  No further msg left.

## 2013-06-03 ENCOUNTER — Other Ambulatory Visit: Payer: Self-pay | Admitting: Internal Medicine

## 2013-06-17 ENCOUNTER — Telehealth: Payer: Self-pay | Admitting: Internal Medicine

## 2013-06-17 NOTE — Telephone Encounter (Signed)
Reviewed message.  Per note, pt advised - evaluation.  Please f/u with pt and confirm went and ok.  Thanks.

## 2013-06-17 NOTE — Telephone Encounter (Signed)
Left detailed message informing the patient to go to Acute Care, Urgent Care, or ER for treatment of sx's. I also mentioned the Saturday clinic on voicemail.

## 2013-06-17 NOTE — Telephone Encounter (Signed)
Pt left vm asking for appt to see Dr. Nicki Reaper today.  States she was sick on Monday and Tuesday with nausea and vomiting.  Felt better Wednesday.  Had low-grade temp on Thursday.  Appetite came back somewhat last night.  States today she is having indigestion.  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  States she has been able to use the bathroom some.  Is burping a lot.  All info left on vm.  Attempting to contact pt at work#.  No appt available this p.m.

## 2013-06-20 ENCOUNTER — Encounter: Payer: Self-pay | Admitting: *Deleted

## 2013-06-20 NOTE — Telephone Encounter (Signed)
Sent mychart message

## 2013-06-21 NOTE — Telephone Encounter (Signed)
I called patient a left a detailed voicemail asking for an update from Friday

## 2013-07-13 ENCOUNTER — Encounter: Payer: Self-pay | Admitting: Internal Medicine

## 2013-07-13 ENCOUNTER — Ambulatory Visit (INDEPENDENT_AMBULATORY_CARE_PROVIDER_SITE_OTHER): Payer: 59 | Admitting: Internal Medicine

## 2013-07-13 VITALS — BP 110/80 | HR 81 | Temp 98.2°F | Ht 62.0 in | Wt 182.8 lb

## 2013-07-13 DIAGNOSIS — R945 Abnormal results of liver function studies: Secondary | ICD-10-CM

## 2013-07-13 DIAGNOSIS — E7800 Pure hypercholesterolemia, unspecified: Secondary | ICD-10-CM

## 2013-07-13 DIAGNOSIS — Z1211 Encounter for screening for malignant neoplasm of colon: Secondary | ICD-10-CM

## 2013-07-13 DIAGNOSIS — I10 Essential (primary) hypertension: Secondary | ICD-10-CM

## 2013-07-13 DIAGNOSIS — G43909 Migraine, unspecified, not intractable, without status migrainosus: Secondary | ICD-10-CM

## 2013-07-13 DIAGNOSIS — R7309 Other abnormal glucose: Secondary | ICD-10-CM

## 2013-07-13 DIAGNOSIS — R7989 Other specified abnormal findings of blood chemistry: Secondary | ICD-10-CM

## 2013-07-13 DIAGNOSIS — R739 Hyperglycemia, unspecified: Secondary | ICD-10-CM

## 2013-07-13 DIAGNOSIS — N950 Postmenopausal bleeding: Secondary | ICD-10-CM

## 2013-07-13 LAB — CBC WITH DIFFERENTIAL/PLATELET
Basophils Absolute: 0 10*3/uL (ref 0.0–0.1)
Basophils Relative: 0.7 % (ref 0.0–3.0)
EOS PCT: 2.6 % (ref 0.0–5.0)
Eosinophils Absolute: 0.2 10*3/uL (ref 0.0–0.7)
HEMATOCRIT: 36.2 % (ref 36.0–46.0)
Hemoglobin: 12.3 g/dL (ref 12.0–15.0)
Lymphocytes Relative: 48.1 % — ABNORMAL HIGH (ref 12.0–46.0)
Lymphs Abs: 2.9 10*3/uL (ref 0.7–4.0)
MCHC: 34 g/dL (ref 30.0–36.0)
MCV: 88 fl (ref 78.0–100.0)
MONO ABS: 0.5 10*3/uL (ref 0.1–1.0)
Monocytes Relative: 8.7 % (ref 3.0–12.0)
Neutro Abs: 2.4 10*3/uL (ref 1.4–7.7)
Neutrophils Relative %: 39.9 % — ABNORMAL LOW (ref 43.0–77.0)
Platelets: 257 10*3/uL (ref 150.0–400.0)
RBC: 4.12 Mil/uL (ref 3.87–5.11)
RDW: 13.6 % (ref 11.5–14.6)
WBC: 6 10*3/uL (ref 4.5–10.5)

## 2013-07-13 LAB — BASIC METABOLIC PANEL
BUN: 22 mg/dL (ref 6–23)
CALCIUM: 10.6 mg/dL — AB (ref 8.4–10.5)
CO2: 29 mEq/L (ref 19–32)
CREATININE: 0.8 mg/dL (ref 0.4–1.2)
Chloride: 102 mEq/L (ref 96–112)
GFR: 75.32 mL/min (ref >60.00)
Glucose, Bld: 90 mg/dL (ref 70–99)
Potassium: 4.5 mEq/L (ref 3.5–5.1)
Sodium: 138 mEq/L (ref 135–145)

## 2013-07-13 LAB — LIPID PANEL
CHOL/HDL RATIO: 5
Cholesterol: 205 mg/dL — ABNORMAL HIGH (ref 0–200)
HDL: 38.1 mg/dL — AB (ref >39.00)
LDL CALC: 144 mg/dL — AB (ref 0–99)
Triglycerides: 116 mg/dL (ref 0.0–149.0)
VLDL: 23.2 mg/dL (ref 0.0–40.0)

## 2013-07-13 LAB — HEPATIC FUNCTION PANEL
ALBUMIN: 4.6 g/dL (ref 3.5–5.2)
ALT: 62 U/L — ABNORMAL HIGH (ref 0–35)
AST: 41 U/L — ABNORMAL HIGH (ref 0–37)
Alkaline Phosphatase: 77 U/L (ref 39–117)
Bilirubin, Direct: 0.1 mg/dL (ref 0.0–0.3)
Total Bilirubin: 1 mg/dL (ref 0.3–1.2)
Total Protein: 7.7 g/dL (ref 6.0–8.3)

## 2013-07-13 LAB — HEMOGLOBIN A1C: HEMOGLOBIN A1C: 5.3 % (ref 4.6–6.5)

## 2013-07-13 LAB — TSH: TSH: 2.32 u[IU]/mL (ref 0.35–5.50)

## 2013-07-13 NOTE — Progress Notes (Signed)
Subjective:    [name], female    DOB: October 23, 1955, 58 y.o.   MRN: [id number]  HPI 58 year old female with past history of hypertension, hypercholesterolemia, reoccurring allergy and sinus problems and abnormal liver function who comes in today to follow up on these issues as well as for a complete physical exam.   She states she has been doing well.  Handling stress well.  No cardiac symptoms with increased activity or exertion.  Breathing stable.  Bowels stable.  States her blood pressure has been doing well.  Blood pressure averaging 117-130/70- 80s.  Following with GI.   Will need f/u ultrasound yearly.  States her last was in 8/14.  She did see gyn for post menopausal bleeding.  See previous notes for details.  Had a polyp removed.  Has had no further bleeding.      Past Medical History  Diagnosis Date  . Hypertension   . Hypercholesterolemia   . Migraine headache   . Recurrent sinusitis     allergies  . Abnormal liver function tests     Current Outpatient Prescriptions on File Prior to Visit  Medication Sig Dispense Refill  . amLODipine (NORVASC) 10 MG tablet TAKE ONE TABLET BY MOUTH ONCE DAILY  30 tablet  6  . fexofenadine (ALLEGRA) 180 MG tablet Take 180 mg by mouth as needed.      . fluticasone (FLONASE) 50 MCG/ACT nasal spray Place 2 sprays into the nose daily as needed.       . hydrochlorothiazide (HYDRODIURIL) 25 MG tablet Take 1 tablet (25 mg total) by mouth daily.  30 tablet  5  . lisinopril (PRINIVIL,ZESTRIL) 40 MG tablet Take 1 tablet (40 mg total) by mouth daily.  30 tablet  5  . loratadine (CLARITIN) 10 MG tablet Take 10 mg by mouth daily.      . naproxen sodium (ANAPROX) 220 MG tablet Take 220 mg by mouth as needed.      Marland Kitchen omega-3 acid ethyl esters (LOVAZA) 1 G capsule Take 1 g by mouth 2 (two) times daily.      Marland Kitchen PARoxetine (PAXIL) 10 MG tablet Take 1 tablet (10 mg total) by mouth daily.  30 tablet  2   No current facility-administered medications on  file prior to visit.    Review of Systems Patient denies any headache, lightheadedness or dizziness.  No significant sinus or allergy symptoms currently.  No chest pain, tightness or palpitations.  No increased shortness of breath, cough or congestion.  No nausea or vomiting.  No abdominal pain or cramping.  No bowel change, such as diarrhea, constipation, BRBPR or melana.  No urine change.  Overall she feel she is doing well.  Handling stress well.  No further vaginal bleeding.       Objective:   Physical Exam  Filed Vitals:   07/13/13 0944  BP: 110/80  Pulse: 81  Temp: 98.2 F (36.8 C)   Blood pressure recheck:  42/70  58 year old female in no acute distress.   HEENT:  Nares- clear.  Oropharynx - without lesions. NECK:  Supple.  Nontender.  No audible bruit.  HEART:  Appears to be regular. LUNGS:  No crackles or wheezing audible.  Respirations even and unlabored.  RADIAL PULSE:  Equal bilaterally.    BREASTS:  No nipple discharge or nipple retraction present.  Could not appreciate any distinct nodules or axillary adenopathy.  ABDOMEN:  Soft, nontender.  Bowel sounds present and normal.  No audible abdominal bruit.  GU:  Not performed.     EXTREMITIES:  No increased edema present.  DP pulses palpable and equal bilaterally.      FEET:  No lesions.      Assessment & Plan:  INCREASED PSYCHOSOCIAL STRESSORS.  Doing well.  On Paxil.  Follow.    CARDIOVASCULAR.  Continue risk factor modification.  Asymptomatic.   HEALTH MAINTENANCE.  Physical today.  Pap (07/12/12) at physical wnl.  Mammogram 03/23/13 - BiRADS I.  Colonoscopy 1/08 - normal.

## 2013-07-13 NOTE — Progress Notes (Signed)
Pre visit review using our clinic review tool, if applicable. No additional management support is needed unless otherwise documented below in the visit note. 

## 2013-07-17 ENCOUNTER — Encounter: Payer: Self-pay | Admitting: Internal Medicine

## 2013-07-17 NOTE — Assessment & Plan Note (Signed)
Saw Dr Klett.  Had polyp removed.  No further bleeding.   

## 2013-07-17 NOTE — Assessment & Plan Note (Signed)
Worked up by Dr Candace Cruise.  See his notes for details.  Ultrasound revealed probable fatty liver.  CT with fatty liver.  Have discussed need for diet, exercise and weight loss.  Continue follow up with GI.  Continue Lovaza for now.  Follow liver panel.

## 2013-07-17 NOTE — Assessment & Plan Note (Signed)
Stable

## 2013-07-17 NOTE — Assessment & Plan Note (Signed)
Blood pressure has been under good control.  Same meds.  Check metabolic panel.   

## 2013-07-17 NOTE — Assessment & Plan Note (Signed)
On lovaza.  Check lipid panel.  Low cholesterol/low fat diet.  Follow.   

## 2013-07-20 ENCOUNTER — Other Ambulatory Visit (INDEPENDENT_AMBULATORY_CARE_PROVIDER_SITE_OTHER): Payer: 59

## 2013-07-20 ENCOUNTER — Encounter: Payer: Self-pay | Admitting: Internal Medicine

## 2013-07-20 DIAGNOSIS — Z1211 Encounter for screening for malignant neoplasm of colon: Secondary | ICD-10-CM

## 2013-07-20 LAB — FECAL OCCULT BLOOD, IMMUNOCHEMICAL: FECAL OCCULT BLD: NEGATIVE

## 2013-09-27 ENCOUNTER — Telehealth: Payer: Self-pay | Admitting: Internal Medicine

## 2013-09-27 NOTE — Telephone Encounter (Signed)
LMTCB re: form for work that was received by fax on 09/22/13 & another copy was received today. First form was signed & faxed back on 09/23/13.

## 2013-10-28 ENCOUNTER — Other Ambulatory Visit: Payer: Self-pay | Admitting: Internal Medicine

## 2013-11-21 ENCOUNTER — Encounter: Payer: Self-pay | Admitting: Internal Medicine

## 2013-11-21 ENCOUNTER — Ambulatory Visit (INDEPENDENT_AMBULATORY_CARE_PROVIDER_SITE_OTHER): Payer: 59 | Admitting: Internal Medicine

## 2013-11-21 VITALS — BP 110/80 | HR 80 | Temp 98.5°F | Ht 62.0 in | Wt 182.8 lb

## 2013-11-21 DIAGNOSIS — R739 Hyperglycemia, unspecified: Secondary | ICD-10-CM

## 2013-11-21 DIAGNOSIS — R7989 Other specified abnormal findings of blood chemistry: Secondary | ICD-10-CM

## 2013-11-21 DIAGNOSIS — I10 Essential (primary) hypertension: Secondary | ICD-10-CM

## 2013-11-21 DIAGNOSIS — M79672 Pain in left foot: Secondary | ICD-10-CM

## 2013-11-21 DIAGNOSIS — R945 Abnormal results of liver function studies: Secondary | ICD-10-CM

## 2013-11-21 DIAGNOSIS — R7309 Other abnormal glucose: Secondary | ICD-10-CM

## 2013-11-21 DIAGNOSIS — N950 Postmenopausal bleeding: Secondary | ICD-10-CM

## 2013-11-21 DIAGNOSIS — R509 Fever, unspecified: Secondary | ICD-10-CM

## 2013-11-21 DIAGNOSIS — M79609 Pain in unspecified limb: Secondary | ICD-10-CM

## 2013-11-21 DIAGNOSIS — E7800 Pure hypercholesterolemia, unspecified: Secondary | ICD-10-CM

## 2013-11-21 DIAGNOSIS — G43009 Migraine without aura, not intractable, without status migrainosus: Secondary | ICD-10-CM

## 2013-11-21 LAB — CBC WITH DIFFERENTIAL/PLATELET
BASOS PCT: 0.5 % (ref 0.0–3.0)
Basophils Absolute: 0 10*3/uL (ref 0.0–0.1)
EOS PCT: 2.5 % (ref 0.0–5.0)
Eosinophils Absolute: 0.2 10*3/uL (ref 0.0–0.7)
HEMATOCRIT: 40 % (ref 36.0–46.0)
Hemoglobin: 13.4 g/dL (ref 12.0–15.0)
LYMPHS ABS: 2.3 10*3/uL (ref 0.7–4.0)
LYMPHS PCT: 38.8 % (ref 12.0–46.0)
MCHC: 33.6 g/dL (ref 30.0–36.0)
MCV: 86.2 fl (ref 78.0–100.0)
MONOS PCT: 8.3 % (ref 3.0–12.0)
Monocytes Absolute: 0.5 10*3/uL (ref 0.1–1.0)
NEUTROS ABS: 3 10*3/uL (ref 1.4–7.7)
Neutrophils Relative %: 49.9 % (ref 43.0–77.0)
Platelets: 237 10*3/uL (ref 150.0–400.0)
RBC: 4.65 Mil/uL (ref 3.87–5.11)
RDW: 14 % (ref 11.5–15.5)
WBC: 6 10*3/uL (ref 4.0–10.5)

## 2013-11-21 LAB — HEPATIC FUNCTION PANEL
ALT: 41 U/L — ABNORMAL HIGH (ref 0–35)
AST: 28 U/L (ref 0–37)
Albumin: 4.2 g/dL (ref 3.5–5.2)
Alkaline Phosphatase: 57 U/L (ref 39–117)
BILIRUBIN DIRECT: 0.1 mg/dL (ref 0.0–0.3)
BILIRUBIN TOTAL: 0.7 mg/dL (ref 0.2–1.2)
Total Protein: 7.6 g/dL (ref 6.0–8.3)

## 2013-11-21 LAB — BASIC METABOLIC PANEL
BUN: 14 mg/dL (ref 6–23)
CHLORIDE: 105 meq/L (ref 96–112)
CO2: 25 mEq/L (ref 19–32)
Calcium: 9.7 mg/dL (ref 8.4–10.5)
Creatinine, Ser: 0.7 mg/dL (ref 0.4–1.2)
GFR: 85.88 mL/min (ref >60.00)
GLUCOSE: 106 mg/dL — AB (ref 70–99)
POTASSIUM: 3.9 meq/L (ref 3.5–5.1)
Sodium: 140 mEq/L (ref 135–145)

## 2013-11-21 LAB — HEMOGLOBIN A1C: Hgb A1c MFr Bld: 6.2 % (ref 4.6–6.5)

## 2013-11-21 LAB — LIPID PANEL
CHOL/HDL RATIO: 6
Cholesterol: 202 mg/dL — ABNORMAL HIGH (ref 0–200)
HDL: 35.4 mg/dL — AB (ref >39.00)
NONHDL: 166.6
Triglycerides: 214 mg/dL — ABNORMAL HIGH (ref 0.0–149.0)
VLDL: 42.8 mg/dL — AB (ref 0.0–40.0)

## 2013-11-21 LAB — LDL CHOLESTEROL, DIRECT: Direct LDL: 143.8 mg/dL

## 2013-11-21 NOTE — Progress Notes (Signed)
Pre visit review using our clinic review tool, if applicable. No additional management support is needed unless otherwise documented below in the visit note. 

## 2013-11-21 NOTE — Progress Notes (Signed)
Subjective:    [name], female    DOB: February 19, 1956, 58 y.o.   MRN: [id number]  HPI 58 year old female with past history of hypertension, hypercholesterolemia, reoccurring allergy and sinus problems and abnormal liver function who comes in today for a scheduled follow up.  She states she has been doing well.  Handling stress well.  No cardiac symptoms with increased activity or exertion.  Breathing stable.  Bowels stable.  States her blood pressure has been doing well.   Following with GI.   Previous documentation will need f/u ultrasound yearly.  States her last was in 8/14.  She did see gyn for post menopausal bleeding.  See previous notes for details.  Had a polyp removed.  Has had no further bleeding.  Had an episode of body aches and fever.  Resolved after 5 days.  Took tylenol.  She is now taking 2 tylenol daily to help prevent a flare.  Not having any symptoms now.   We discussed stopping the scheduled tylenol.  Increased pain base of third and fourth toe.  Tenderness to palpation.     Past Medical History  Diagnosis Date  . Hypertension   . Hypercholesterolemia   . Migraine headache   . Recurrent sinusitis     allergies  . Abnormal liver function tests     Current Outpatient Prescriptions on File Prior to Visit  Medication Sig Dispense Refill  . acetaminophen (TYLENOL) 500 MG tablet Take 1,000 mg by mouth every 8 (eight) hours as needed.      Marland Kitchen amLODipine (NORVASC) 10 MG tablet TAKE ONE TABLET BY MOUTH ONCE DAILY  30 tablet  6  . fexofenadine (ALLEGRA) 180 MG tablet Take 180 mg by mouth as needed.      . fluticasone (FLONASE) 50 MCG/ACT nasal spray Place 2 sprays into the nose daily as needed.       . hydrochlorothiazide (HYDRODIURIL) 25 MG tablet Take 1 tablet (25 mg total) by mouth daily.  30 tablet  5  . ibuprofen (ADVIL,MOTRIN) 200 MG tablet Take 600-800 mg by mouth every 8 (eight) hours as needed.      Marland Kitchen lisinopril (PRINIVIL,ZESTRIL) 40 MG tablet Take 1 tablet  (40 mg total) by mouth daily.  30 tablet  5  . loratadine (CLARITIN) 10 MG tablet Take 20 mg by mouth daily.       . naproxen sodium (ANAPROX) 220 MG tablet Take 220 mg by mouth as needed.      Marland Kitchen omega-3 acid ethyl esters (LOVAZA) 1 G capsule Take 1 g by mouth 2 (two) times daily.      Marland Kitchen PARoxetine (PAXIL) 10 MG tablet TAKE 1 TABLET (10 MG TOTAL) BY MOUTH DAILY.  30 tablet  2   No current facility-administered medications on file prior to visit.    Review of Systems Patient denies any headache, lightheadedness or dizziness.  No sinus or allergy symptoms.  No chest pain, tightness or palpitations.  No increased shortness of breath, cough or congestion.  No nausea or vomiting.  No acid reflux.  No abdominal pain or cramping.  No bowel change, such as diarrhea, constipation, BRBPR or melana.  No urine change.  Overall she feel she is doing well.  Handling stress well.  Having pain beneath her toes as outlined.  No further fever or body aches.  Taking tylenol daily (2 in am).        Objective:   Physical Exam  Filed Vitals:   11/21/13  0801  BP: 110/80  Pulse: 80  Temp: 98.5 F (36.9 C)   Blood pressure recheck:  49/61  58 year old female in no acute distress.   HEENT:  Nares- clear.  Oropharynx - without lesions. NECK:  Supple.  Nontender.  No audible bruit.  HEART:  Appears to be regular. LUNGS:  No crackles or wheezing audible.  Respirations even and unlabored.  RADIAL PULSE:  Equal bilaterally.   ABDOMEN:  Soft, nontender.  Bowel sounds present and normal.  No audible abdominal bruit.     EXTREMITIES:  No increased edema present.  DP pulses palpable and equal bilaterally.      FEET:  No lesions.  MSK:  Increased pain to palpation between the 3rd and 4th toe (plantar surface).       Assessment & Plan:  INCREASED PSYCHOSOCIAL STRESSORS.  Doing well.  On Paxil.  Follow.    CARDIOVASCULAR.  Continue risk factor modification.  Asymptomatic.   HEALTH MAINTENANCE.  Physical  07/13/13.  Pap (07/12/12) at physical wnl.  Mammogram 03/23/13 - BiRADS I.  Colonoscopy 1/08 - normal.

## 2013-11-21 NOTE — Assessment & Plan Note (Signed)
Saw Dr Cristino Martes.  Had polyp removed.  No further bleeding.

## 2013-11-22 ENCOUNTER — Encounter: Payer: Self-pay | Admitting: Internal Medicine

## 2013-11-22 DIAGNOSIS — R509 Fever, unspecified: Secondary | ICD-10-CM | POA: Insufficient documentation

## 2013-11-22 DIAGNOSIS — M79673 Pain in unspecified foot: Secondary | ICD-10-CM | POA: Insufficient documentation

## 2013-11-22 NOTE — Assessment & Plan Note (Signed)
Stable

## 2013-11-22 NOTE — Assessment & Plan Note (Signed)
Pt with increased pain base of third and fourth toes as outlined.  Some intermittent numbness.  Question of Morton's neuroma.  She will make her own appt with podiatry and let us know if needs a referral.

## 2013-11-22 NOTE — Assessment & Plan Note (Signed)
She reported the episode of fever and body aches as outlined.  Resolved.  Still taking tylenol daily to "prevent another occurrence".  Instructed to stop the tylenol and we will follow.

## 2013-11-22 NOTE — Assessment & Plan Note (Signed)
Worked up by Dr Candace Cruise.  See his notes for details.  Ultrasound revealed probable fatty liver.  CT with fatty liver.  Have discussed need for diet, exercise and weight loss.  We again discussed this today.  Has no follow up scheduled with GI.  Continue Lovaza for now.  Follow liver panel.  Will discuss with GI regarding recommendation of yearly ultrasound.

## 2013-11-22 NOTE — Assessment & Plan Note (Signed)
On lovaza.  Check lipid panel.  Low cholesterol/low fat diet.  Follow.

## 2013-11-22 NOTE — Assessment & Plan Note (Signed)
Blood pressure has been under good control.  Same meds.  Check metabolic panel.

## 2013-12-14 ENCOUNTER — Encounter: Payer: Self-pay | Admitting: Internal Medicine

## 2014-03-08 ENCOUNTER — Encounter: Payer: Self-pay | Admitting: Internal Medicine

## 2014-03-08 ENCOUNTER — Other Ambulatory Visit: Payer: Self-pay | Admitting: Internal Medicine

## 2014-03-08 MED ORDER — LISINOPRIL 40 MG PO TABS: 40.0000 mg | ORAL_TABLET | Freq: Every day | ORAL | Status: DC

## 2014-03-08 MED ORDER — PAROXETINE HCL 10 MG PO TABS: 10.0000 mg | ORAL_TABLET | Freq: Every day | ORAL | Status: DC

## 2014-03-08 MED ORDER — AMLODIPINE BESYLATE 10 MG PO TABS: 10.0000 mg | ORAL_TABLET | Freq: Every day | ORAL | Status: DC

## 2014-03-08 MED ORDER — OMEGA-3-ACID ETHYL ESTERS 1 G PO CAPS: 1.0000 g | ORAL_CAPSULE | Freq: Two times a day (BID) | ORAL | Status: DC

## 2014-03-08 MED ORDER — HYDROCHLOROTHIAZIDE 25 MG PO TABS: 25.0000 mg | ORAL_TABLET | Freq: Every day | ORAL | Status: DC

## 2014-03-08 NOTE — Telephone Encounter (Signed)
rx sent in for paxil, amlodipine, lisinopril and hctz (#30 with 2 refills). Pt notified via my chart.

## 2014-03-14 ENCOUNTER — Encounter: Payer: Self-pay | Admitting: *Deleted

## 2014-03-27 ENCOUNTER — Ambulatory Visit: Payer: 59 | Admitting: Internal Medicine

## 2014-03-28 ENCOUNTER — Ambulatory Visit: Payer: Self-pay | Admitting: Internal Medicine

## 2014-03-28 LAB — HM MAMMOGRAPHY: HM MAMMO: NEGATIVE

## 2014-04-25 ENCOUNTER — Encounter: Payer: Self-pay | Admitting: Internal Medicine

## 2014-05-04 ENCOUNTER — Ambulatory Visit (INDEPENDENT_AMBULATORY_CARE_PROVIDER_SITE_OTHER): Payer: 59 | Admitting: Internal Medicine

## 2014-05-04 ENCOUNTER — Encounter: Payer: Self-pay | Admitting: Internal Medicine

## 2014-05-04 VITALS — BP 130/80 | HR 81 | Temp 98.6°F | Ht 62.0 in | Wt 188.2 lb

## 2014-05-04 DIAGNOSIS — R7989 Other specified abnormal findings of blood chemistry: Secondary | ICD-10-CM

## 2014-05-04 DIAGNOSIS — Z0000 Encounter for general adult medical examination without abnormal findings: Secondary | ICD-10-CM | POA: Insufficient documentation

## 2014-05-04 DIAGNOSIS — R945 Abnormal results of liver function studies: Secondary | ICD-10-CM

## 2014-05-04 DIAGNOSIS — E669 Obesity, unspecified: Secondary | ICD-10-CM

## 2014-05-04 DIAGNOSIS — E7800 Pure hypercholesterolemia, unspecified: Secondary | ICD-10-CM

## 2014-05-04 DIAGNOSIS — I10 Essential (primary) hypertension: Secondary | ICD-10-CM

## 2014-05-04 DIAGNOSIS — N950 Postmenopausal bleeding: Secondary | ICD-10-CM

## 2014-05-04 DIAGNOSIS — Z6837 Body mass index (BMI) 37.0-37.9, adult: Secondary | ICD-10-CM | POA: Insufficient documentation

## 2014-05-04 LAB — BASIC METABOLIC PANEL
BUN: 23 mg/dL (ref 6–23)
CALCIUM: 10.3 mg/dL (ref 8.4–10.5)
CO2: 25 meq/L (ref 19–32)
Chloride: 101 mEq/L (ref 96–112)
Creatinine, Ser: 0.96 mg/dL (ref 0.40–1.20)
GFR: 63.5 mL/min (ref >60.00)
Glucose, Bld: 117 mg/dL — ABNORMAL HIGH (ref 70–99)
Potassium: 4 mEq/L (ref 3.5–5.1)
Sodium: 137 mEq/L (ref 135–145)

## 2014-05-04 LAB — LIPID PANEL
CHOL/HDL RATIO: 6
Cholesterol: 231 mg/dL — ABNORMAL HIGH (ref 0–200)
HDL: 38.1 mg/dL — AB (ref >39.00)
NonHDL: 192.9
TRIGLYCERIDES: 232 mg/dL — AB (ref 0.0–149.0)
VLDL: 46.4 mg/dL — AB (ref 0.0–40.0)

## 2014-05-04 LAB — HEPATIC FUNCTION PANEL
ALK PHOS: 79 U/L (ref 39–117)
ALT: 55 U/L — ABNORMAL HIGH (ref 0–35)
AST: 32 U/L (ref 0–37)
Albumin: 4.5 g/dL (ref 3.5–5.2)
BILIRUBIN TOTAL: 0.5 mg/dL (ref 0.2–1.2)
Bilirubin, Direct: 0.1 mg/dL (ref 0.0–0.3)
Total Protein: 7.3 g/dL (ref 6.0–8.3)

## 2014-05-04 LAB — LDL CHOLESTEROL, DIRECT: Direct LDL: 153 mg/dL

## 2014-05-04 LAB — HEMOGLOBIN A1C: Hgb A1c MFr Bld: 6.2 % (ref 4.6–6.5)

## 2014-05-04 MED ORDER — LISINOPRIL 40 MG PO TABS: 40.0000 mg | ORAL_TABLET | Freq: Every day | ORAL | Status: DC

## 2014-05-04 MED ORDER — AMLODIPINE BESYLATE 10 MG PO TABS: 10.0000 mg | ORAL_TABLET | Freq: Every day | ORAL | Status: DC

## 2014-05-04 MED ORDER — HYDROCHLOROTHIAZIDE 25 MG PO TABS: 25.0000 mg | ORAL_TABLET | Freq: Every day | ORAL | Status: DC

## 2014-05-04 MED ORDER — PAROXETINE HCL 10 MG PO TABS: 10.0000 mg | ORAL_TABLET | Freq: Every day | ORAL | Status: DC

## 2014-05-04 NOTE — Progress Notes (Signed)
[name], female   DOB: [birth year], 59 y.o.   MRN: [id number]   Subjective:    [name], female    DOB: October 28, 1955, 59 y.o.   MRN: [id number]  HPI  Patient here for follow up.  Has a history of hypertension, hypercholesterolemia and abnormal liver function tests.  States she is doing well.  Started back exercising.  Planning to get more in a routine. She only takes lovaza one time per week.  Trying to watch her diet.  Doing well regarding stress.  Overall feels good.     Past Medical History  Diagnosis Date  . Hypertension   . Hypercholesterolemia   . Migraine headache   . Recurrent sinusitis     allergies  . Abnormal liver function tests     Current Outpatient Prescriptions on File Prior to Visit  Medication Sig Dispense Refill  . acetaminophen (TYLENOL) 500 MG tablet Take 1,000 mg by mouth every 8 (eight) hours as needed.    . fexofenadine (ALLEGRA) 180 MG tablet Take 180 mg by mouth as needed.    . fluticasone (FLONASE) 50 MCG/ACT nasal spray Place 2 sprays into the nose daily as needed.     Marland Kitchen ibuprofen (ADVIL,MOTRIN) 200 MG tablet Take 600-800 mg by mouth every 8 (eight) hours as needed.    . loratadine (CLARITIN) 10 MG tablet Take 20 mg by mouth daily.     . naproxen sodium (ANAPROX) 220 MG tablet Take 220 mg by mouth as needed.    Marland Kitchen omega-3 acid ethyl esters (LOVAZA) 1 G capsule Take 1 capsule (1 g total) by mouth 2 (two) times daily. (Patient not taking: Reported on 05/04/2014) 60 capsule 5   No current facility-administered medications on file prior to visit.    Review of Systems  Constitutional: Negative for fatigue and unexpected weight change.  HENT: Negative for congestion and sinus pressure.   Respiratory: Negative for cough, chest tightness and shortness of breath.   Cardiovascular: Negative for chest pain, palpitations and leg swelling.  Gastrointestinal: Negative for nausea, vomiting, abdominal pain and diarrhea.       Objective:      Physical Exam  HENT:  Nose: Nose normal.  Mouth/Throat: Oropharynx is clear and moist.  Neck: Neck supple. No thyromegaly present.  Cardiovascular: Normal rate and regular rhythm.   Pulmonary/Chest: Breath sounds normal. No respiratory distress. She has no wheezes.  Abdominal: Soft. Bowel sounds are normal. There is no tenderness.  Musculoskeletal: She exhibits no edema or tenderness.  Lymphadenopathy:    She has no cervical adenopathy.    BP 130/80 mmHg  Pulse 81  Temp(Src) 98.6 F (37 C) (Oral)  Ht 5\' 2"  (1.575 m)  Wt 188 lb 4 oz (85.39 kg)  BMI 34.42 kg/m2  SpO2 96% Wt Readings from Last 3 Encounters:  05/04/14 188 lb 4 oz (85.39 kg)  11/21/13 182 lb 12 oz (82.895 kg)  07/13/13 182 lb 12 oz (82.895 kg)     Lab Results  Component Value Date   WBC 6.0 11/21/2013   HGB 13.4 11/21/2013   HCT 40.0 11/21/2013   PLT 237.0 11/21/2013   GLUCOSE 106* 11/21/2013   CHOL 202* 11/21/2013   TRIG 214.0* 11/21/2013   HDL 35.40* 11/21/2013   LDLDIRECT 143.8 11/21/2013   LDLCALC 144* 07/13/2013   ALT 41* 11/21/2013   AST 28 11/21/2013   NA 140 11/21/2013   K 3.9 11/21/2013   CL 105 11/21/2013   CREATININE 0.7 11/21/2013  BUN 14 11/21/2013   CO2 25 11/21/2013   TSH 2.32 07/13/2013   HGBA1C 6.2 11/21/2013       Assessment & Plan:   Problem List Items Addressed This Visit    Abnormal liver function tests    Previously worked up by Dr Kathleen Yu.  See his note for details.  Ultrasound revealed probable fatty liver.  CT with fatty liver.  Have discussed diet, exercise and weight loss.  She has stopped lovaza (only taking one per week).  Discussed with GI.  Recommended ultrasound yearly.  Discussed with her today.  She declines.  Follow.  Check liver panel today.        Relevant Orders   Hepatic function panel   Health care maintenance    Schedule physical next visit.       Hypercholesterolemia    Low cholesterol diet and exercise.  Only taking lovaza one per week.  Check  lipid panel today.       Relevant Medications   amLODIpine (NORVASC) tablet   hydrochlorothiazide tablet   lisinopril (PRINIVIL,ZESTRIL) tablet   Other Relevant Orders   Lipid panel   Hemoglobin A1c   Hypertension - Primary    Blood pressure doing well.  Same medication regimen.  Follow.  Check metabolic panel.       Relevant Medications   amLODIpine (NORVASC) tablet   hydrochlorothiazide tablet   lisinopril (PRINIVIL,ZESTRIL) tablet   Other Relevant Orders   Basic metabolic panel   Obesity (BMI 30-39.9)    Discussed diet and exercise.  She is getting back in her routine of exercising.        Post-menopausal bleeding    Saw Dr Kathleen Yu previously.  No further bleeding.            Einar Pheasant, MD

## 2014-05-04 NOTE — Assessment & Plan Note (Signed)
Saw Dr Cristino Martes previously.  No further bleeding.

## 2014-05-04 NOTE — Progress Notes (Signed)
Pre visit review using our clinic review tool, if applicable. No additional management support is needed unless otherwise documented below in the visit note. 

## 2014-05-04 NOTE — Assessment & Plan Note (Signed)
Discussed diet and exercise.  She is getting back in her routine of exercising.

## 2014-05-04 NOTE — Assessment & Plan Note (Signed)
Schedule physical next visit.

## 2014-05-04 NOTE — Assessment & Plan Note (Signed)
Low cholesterol diet and exercise.  Only taking lovaza one per week.  Check lipid panel today.

## 2014-05-04 NOTE — Assessment & Plan Note (Signed)
Previously worked up by Dr Candace Cruise.  See his note for details.  Ultrasound revealed probable fatty liver.  CT with fatty liver.  Have discussed diet, exercise and weight loss.  She has stopped lovaza (only taking one per week).  Discussed with GI.  Recommended ultrasound yearly.  Discussed with her today.  She declines.  Follow.  Check liver panel today.

## 2014-05-04 NOTE — Assessment & Plan Note (Signed)
Blood pressure doing well.  Same medication regimen.  Follow.  Check metabolic panel.  

## 2014-05-05 ENCOUNTER — Encounter: Payer: Self-pay | Admitting: Internal Medicine

## 2014-08-07 ENCOUNTER — Encounter: Payer: Self-pay | Admitting: Internal Medicine

## 2014-08-07 ENCOUNTER — Ambulatory Visit (INDEPENDENT_AMBULATORY_CARE_PROVIDER_SITE_OTHER): Payer: 59 | Admitting: Internal Medicine

## 2014-08-07 VITALS — BP 110/70 | HR 78 | Temp 98.1°F | Ht 62.0 in | Wt 191.4 lb

## 2014-08-07 DIAGNOSIS — R739 Hyperglycemia, unspecified: Secondary | ICD-10-CM | POA: Diagnosis not present

## 2014-08-07 DIAGNOSIS — E7800 Pure hypercholesterolemia, unspecified: Secondary | ICD-10-CM

## 2014-08-07 DIAGNOSIS — E119 Type 2 diabetes mellitus without complications: Secondary | ICD-10-CM | POA: Insufficient documentation

## 2014-08-07 DIAGNOSIS — Z0000 Encounter for general adult medical examination without abnormal findings: Secondary | ICD-10-CM | POA: Diagnosis not present

## 2014-08-07 DIAGNOSIS — G43009 Migraine without aura, not intractable, without status migrainosus: Secondary | ICD-10-CM

## 2014-08-07 DIAGNOSIS — Z1211 Encounter for screening for malignant neoplasm of colon: Secondary | ICD-10-CM | POA: Diagnosis not present

## 2014-08-07 DIAGNOSIS — R7989 Other specified abnormal findings of blood chemistry: Secondary | ICD-10-CM

## 2014-08-07 DIAGNOSIS — I10 Essential (primary) hypertension: Secondary | ICD-10-CM | POA: Diagnosis not present

## 2014-08-07 DIAGNOSIS — E669 Obesity, unspecified: Secondary | ICD-10-CM

## 2014-08-07 DIAGNOSIS — R945 Abnormal results of liver function studies: Secondary | ICD-10-CM

## 2014-08-07 MED ORDER — AMLODIPINE BESYLATE 10 MG PO TABS: 10.0000 mg | ORAL_TABLET | Freq: Every day | ORAL | Status: DC

## 2014-08-07 MED ORDER — LISINOPRIL 40 MG PO TABS: 40.0000 mg | ORAL_TABLET | Freq: Every day | ORAL | Status: DC

## 2014-08-07 MED ORDER — HYDROCHLOROTHIAZIDE 25 MG PO TABS: 25.0000 mg | ORAL_TABLET | Freq: Every day | ORAL | Status: DC

## 2014-08-07 MED ORDER — PAROXETINE HCL 10 MG PO TABS: 10.0000 mg | ORAL_TABLET | Freq: Every day | ORAL | Status: DC

## 2014-08-07 NOTE — Assessment & Plan Note (Signed)
Blood pressure doing well.  Same medication regimen.  Follow pressures.  Follow metabolic panel.   

## 2014-08-07 NOTE — Progress Notes (Signed)
[name], female   DOB: Aug 15, 1955, 59 y.o.   MRN: [id number]   Subjective:    [name], female    DOB: 1955-08-16, 59 y.o.   MRN: [id number]  HPI  Patient here to follow up on her current medical issues and for a physical exam.  She has been under some increased stress with work.  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  Feels she is handling things relatively well.  Found a tick on her this morning.  Removed.  On her belly button.  No significant redness or swelling.  Has started back exercising.  No cardiac symptoms with increased activity or exertion.  Breathing stable.  Trying to watch what she is eating.     Past Medical History  Diagnosis Date  . Hypertension   . Hypercholesterolemia   . Migraine headache   . Recurrent sinusitis     allergies  . Abnormal liver function tests     Current Outpatient Prescriptions on File Prior to Visit  Medication Sig Dispense Refill  . acetaminophen (TYLENOL) 500 MG tablet Take 1,000 mg by mouth every 8 (eight) hours as needed.    . fexofenadine (ALLEGRA) 180 MG tablet Take 180 mg by mouth as needed.    . fluticasone (FLONASE) 50 MCG/ACT nasal spray Place 2 sprays into the nose daily as needed.     Marland Kitchen ibuprofen (ADVIL,MOTRIN) 200 MG tablet Take 600-800 mg by mouth every 8 (eight) hours as needed.    . loratadine (CLARITIN) 10 MG tablet Take 20 mg by mouth daily.     . naproxen sodium (ANAPROX) 220 MG tablet Take 220 mg by mouth as needed.     No current facility-administered medications on file prior to visit.    Review of Systems  Constitutional: Negative for appetite change.       Gained a few pounds since last visit.  Is exercising.  Trying to watch her diet.   HENT: Negative for congestion and sinus pressure.   Eyes: Negative for pain and visual disturbance.  Respiratory: Negative for cough, chest tightness and shortness of breath.   Cardiovascular: Negative for chest pain, palpitations and leg  swelling.  Gastrointestinal: Negative for nausea, vomiting, abdominal pain and diarrhea.  Genitourinary: Negative for dysuria and difficulty urinating.  Musculoskeletal: Negative for back pain and joint swelling.  Skin: Negative for color change and rash.  Neurological: Negative for dizziness, light-headedness and headaches.  Hematological: Negative for adenopathy. Does not bruise/bleed easily.  Psychiatric/Behavioral: Negative for dysphoric mood and agitation.       Objective:     Blood pressure recheck:  112/76  Physical Exam  Constitutional: She is oriented to person, place, and time. She appears well-developed and well-nourished.  HENT:  Nose: Nose normal.  Mouth/Throat: Oropharynx is clear and moist.  Eyes: Right eye exhibits no discharge. Left eye exhibits no discharge. No scleral icterus.  Neck: Neck supple. No thyromegaly present.  Cardiovascular: Normal rate and regular rhythm.   Pulmonary/Chest: Breath sounds normal. No accessory muscle usage. No tachypnea. No respiratory distress. She has no decreased breath sounds. She has no wheezes. She has no rhonchi. Right breast exhibits no inverted nipple, no mass, no nipple discharge and no tenderness (no axillary adenopathy). Left breast exhibits no inverted nipple, no mass, no nipple discharge and no tenderness (no axilarry adenopathy).  Abdominal: Soft. Bowel sounds are normal. There is no tenderness.  Musculoskeletal: She exhibits no edema or tenderness.  Lymphadenopathy:    She has no cervical adenopathy.  Neurological: She is alert and oriented to person, place, and time.  Skin: Skin is warm. No rash noted.  Psychiatric: She has a normal mood and affect. Her behavior is normal.    BP 110/70 mmHg  Pulse 78  Temp(Src) 98.1 F (36.7 C) (Oral)  Ht $R'5\' 2"'ES$  (1.575 m)  Wt 191 lb 6 oz (86.807 kg)  BMI 34.99 kg/m2  SpO2 94% Wt Readings from Last 3 Encounters:  08/07/14 191 lb 6 oz (86.807 kg)  05/04/14 188 lb 4 oz (85.39 kg)    11/21/13 182 lb 12 oz (82.895 kg)     Lab Results  Component Value Date   WBC 6.0 11/21/2013   HGB 13.4 11/21/2013   HCT 40.0 11/21/2013   PLT 237.0 11/21/2013   GLUCOSE 78 08/07/2014   CHOL 214* 08/07/2014   TRIG 147.0 08/07/2014   HDL 37.00* 08/07/2014   LDLDIRECT 153.0 05/04/2014   LDLCALC 148* 08/07/2014   ALT 74* 08/07/2014   AST 44* 08/07/2014   NA 137 08/07/2014   K 3.9 08/07/2014   CL 101 08/07/2014   CREATININE 0.94 08/07/2014   BUN 20 08/07/2014   CO2 25 08/07/2014   TSH 1.49 08/07/2014   HGBA1C 6.3 08/07/2014       Assessment & Plan:   Problem List Items Addressed This Visit    Abnormal liver function tests    Previously worked up by Dr Candace Cruise.  See his note for details.  Previous ultrasound revealed probable fatty liver.  CT with fatty liver.  Have discussed diet, exercise and weight loss.  She has stopped lovaza.  GI recommended ultrasound yearly.  Again discussed with her today.  She declines ultrasound.  Recheck liver panel today.       Relevant Orders   Hepatic function panel (Completed)   Health care maintenance    Physical 08/07/14.  PAP 07/12/12 - ok.  Colonoscopy 03/2006.  Due 2018.  IFOB given.        Hypercholesterolemia    Off lovaza.  Low cholesterol diet and exercise.  Follow lipid panel.        Relevant Medications   amLODipine (NORVASC) 10 MG tablet   hydrochlorothiazide (HYDRODIURIL) 25 MG tablet   lisinopril (PRINIVIL,ZESTRIL) 40 MG tablet   Other Relevant Orders   Lipid panel (Completed)   Hyperglycemia    Low carb diet and exercise.  Follow met b and a1c.        Relevant Orders   TSH (Completed)   Hemoglobin A1c (Completed)   Hypertension    Blood pressure doing well.  Same medication regimen.  Follow pressures.  Follow metabolic panel.       Relevant Medications   amLODipine (NORVASC) 10 MG tablet   hydrochlorothiazide (HYDRODIURIL) 25 MG tablet   lisinopril (PRINIVIL,ZESTRIL) 40 MG tablet   Other Relevant Orders   Basic  metabolic panel (Completed)   Migraine headache    Stable.        Relevant Medications   amLODipine (NORVASC) 10 MG tablet   hydrochlorothiazide (HYDRODIURIL) 25 MG tablet   lisinopril (PRINIVIL,ZESTRIL) 40 MG tablet   PARoxetine (PAXIL) 10 MG tablet   Obesity (BMI 30-39.9)    Diet and exercise.         Other Visit Diagnoses    Colon cancer screening    -  Primary    Relevant Orders    Fecal occult blood, imunochemical      I  spent 25 minutes with the patient and more than 50% of the time was spent in consultation regarding the above.     Einar Pheasant, MD

## 2014-08-07 NOTE — Assessment & Plan Note (Signed)
Physical 08/07/14.  PAP 07/12/12 - ok.  Colonoscopy 03/2006.  Due 2018.  IFOB given.

## 2014-08-07 NOTE — Progress Notes (Signed)
Pre visit review using our clinic review tool, if applicable. No additional management support is needed unless otherwise documented below in the visit note. 

## 2014-08-08 LAB — LIPID PANEL
Cholesterol: 214 mg/dL — ABNORMAL HIGH (ref 0–200)
HDL: 37 mg/dL — AB (ref >39.00)
LDL Cholesterol: 148 mg/dL — ABNORMAL HIGH (ref 0–99)
NONHDL: 177
TRIGLYCERIDES: 147 mg/dL (ref 0.0–149.0)
Total CHOL/HDL Ratio: 6
VLDL: 29.4 mg/dL (ref 0.0–40.0)

## 2014-08-08 LAB — BASIC METABOLIC PANEL
BUN: 20 mg/dL (ref 6–23)
CALCIUM: 10.1 mg/dL (ref 8.4–10.5)
CO2: 25 mEq/L (ref 19–32)
CREATININE: 0.94 mg/dL (ref 0.40–1.20)
Chloride: 101 mEq/L (ref 96–112)
GFR: 65 mL/min (ref >60.00)
Glucose, Bld: 78 mg/dL (ref 70–99)
Potassium: 3.9 mEq/L (ref 3.5–5.1)
Sodium: 137 mEq/L (ref 135–145)

## 2014-08-08 LAB — HEMOGLOBIN A1C: Hgb A1c MFr Bld: 6.3 % (ref 4.6–6.5)

## 2014-08-08 LAB — HEPATIC FUNCTION PANEL
ALK PHOS: 72 U/L (ref 39–117)
ALT: 74 U/L — ABNORMAL HIGH (ref 0–35)
AST: 44 U/L — ABNORMAL HIGH (ref 0–37)
Albumin: 4.3 g/dL (ref 3.5–5.2)
Bilirubin, Direct: 0.1 mg/dL (ref 0.0–0.3)
Total Bilirubin: 0.6 mg/dL (ref 0.2–1.2)
Total Protein: 7.6 g/dL (ref 6.0–8.3)

## 2014-08-08 LAB — TSH: TSH: 1.49 u[IU]/mL (ref 0.35–4.50)

## 2014-08-10 ENCOUNTER — Encounter: Payer: Self-pay | Admitting: Internal Medicine

## 2014-08-10 ENCOUNTER — Telehealth: Payer: Self-pay | Admitting: Internal Medicine

## 2014-08-10 DIAGNOSIS — R7989 Other specified abnormal findings of blood chemistry: Secondary | ICD-10-CM

## 2014-08-10 DIAGNOSIS — R945 Abnormal results of liver function studies: Secondary | ICD-10-CM

## 2014-08-10 NOTE — Telephone Encounter (Signed)
Pt was notified of labs via my chart.  Needs a f/u non fasting lab appt in 2-3 weeks.  Please notify the patient of the appointment date and time.  Thanks.

## 2014-08-13 ENCOUNTER — Encounter: Payer: Self-pay | Admitting: Internal Medicine

## 2014-08-13 NOTE — Assessment & Plan Note (Signed)
Off lovaza.  Low cholesterol diet and exercise.  Follow lipid panel.

## 2014-08-13 NOTE — Assessment & Plan Note (Signed)
Diet and exercise.   

## 2014-08-13 NOTE — Assessment & Plan Note (Signed)
Previously worked up by Dr Candace Cruise.  See his note for details.  Previous ultrasound revealed probable fatty liver.  CT with fatty liver.  Have discussed diet, exercise and weight loss.  She has stopped lovaza.  GI recommended ultrasound yearly.  Again discussed with her today.  She declines ultrasound.  Recheck liver panel today.

## 2014-08-13 NOTE — Assessment & Plan Note (Signed)
Stable

## 2014-08-13 NOTE — Assessment & Plan Note (Signed)
Low carb diet and exercise.  Follow met b and a1c.   

## 2014-08-14 ENCOUNTER — Other Ambulatory Visit (INDEPENDENT_AMBULATORY_CARE_PROVIDER_SITE_OTHER): Payer: 59

## 2014-08-14 ENCOUNTER — Encounter: Payer: Self-pay | Admitting: Internal Medicine

## 2014-08-14 DIAGNOSIS — Z1211 Encounter for screening for malignant neoplasm of colon: Secondary | ICD-10-CM

## 2014-08-14 LAB — FECAL OCCULT BLOOD, IMMUNOCHEMICAL: Fecal Occult Bld: NEGATIVE

## 2014-08-21 ENCOUNTER — Encounter: Payer: 59 | Admitting: Internal Medicine

## 2014-08-31 ENCOUNTER — Other Ambulatory Visit: Payer: 59

## 2014-08-31 ENCOUNTER — Other Ambulatory Visit (INDEPENDENT_AMBULATORY_CARE_PROVIDER_SITE_OTHER): Payer: 59

## 2014-08-31 DIAGNOSIS — R7989 Other specified abnormal findings of blood chemistry: Secondary | ICD-10-CM | POA: Diagnosis not present

## 2014-08-31 DIAGNOSIS — R945 Abnormal results of liver function studies: Secondary | ICD-10-CM

## 2014-08-31 LAB — HEPATIC FUNCTION PANEL
ALT: 64 U/L — AB (ref 0–35)
AST: 33 U/L (ref 0–37)
Albumin: 4.3 g/dL (ref 3.5–5.2)
Alkaline Phosphatase: 78 U/L (ref 39–117)
BILIRUBIN DIRECT: 0.1 mg/dL (ref 0.0–0.3)
Total Bilirubin: 0.4 mg/dL (ref 0.2–1.2)
Total Protein: 7 g/dL (ref 6.0–8.3)

## 2014-09-01 ENCOUNTER — Encounter: Payer: Self-pay | Admitting: Internal Medicine

## 2014-12-11 ENCOUNTER — Encounter: Payer: Self-pay | Admitting: Internal Medicine

## 2014-12-11 ENCOUNTER — Ambulatory Visit (INDEPENDENT_AMBULATORY_CARE_PROVIDER_SITE_OTHER): Payer: 59 | Admitting: Internal Medicine

## 2014-12-11 VITALS — BP 100/80 | HR 83 | Temp 98.1°F | Ht 62.0 in | Wt 195.2 lb

## 2014-12-11 DIAGNOSIS — R739 Hyperglycemia, unspecified: Secondary | ICD-10-CM | POA: Diagnosis not present

## 2014-12-11 DIAGNOSIS — I10 Essential (primary) hypertension: Secondary | ICD-10-CM

## 2014-12-11 DIAGNOSIS — G43009 Migraine without aura, not intractable, without status migrainosus: Secondary | ICD-10-CM

## 2014-12-11 DIAGNOSIS — Z1239 Encounter for other screening for malignant neoplasm of breast: Secondary | ICD-10-CM | POA: Diagnosis not present

## 2014-12-11 DIAGNOSIS — E7800 Pure hypercholesterolemia, unspecified: Secondary | ICD-10-CM

## 2014-12-11 DIAGNOSIS — R7989 Other specified abnormal findings of blood chemistry: Secondary | ICD-10-CM

## 2014-12-11 DIAGNOSIS — R945 Abnormal results of liver function studies: Secondary | ICD-10-CM

## 2014-12-11 DIAGNOSIS — E669 Obesity, unspecified: Secondary | ICD-10-CM

## 2014-12-11 LAB — CBC WITH DIFFERENTIAL/PLATELET
BASOS ABS: 0 10*3/uL (ref 0.0–0.1)
Basophils Relative: 0.6 % (ref 0.0–3.0)
EOS ABS: 0.2 10*3/uL (ref 0.0–0.7)
Eosinophils Relative: 3 % (ref 0.0–5.0)
HCT: 40.1 % (ref 36.0–46.0)
Hemoglobin: 13.4 g/dL (ref 12.0–15.0)
LYMPHS ABS: 2.3 10*3/uL (ref 0.7–4.0)
Lymphocytes Relative: 32.3 % (ref 12.0–46.0)
MCHC: 33.5 g/dL (ref 30.0–36.0)
MCV: 86.5 fl (ref 78.0–100.0)
Monocytes Absolute: 0.6 10*3/uL (ref 0.1–1.0)
Monocytes Relative: 8.1 % (ref 3.0–12.0)
NEUTROS ABS: 4 10*3/uL (ref 1.4–7.7)
NEUTROS PCT: 56 % (ref 43.0–77.0)
PLATELETS: 230 10*3/uL (ref 150.0–400.0)
RBC: 4.63 Mil/uL (ref 3.87–5.11)
RDW: 14.3 % (ref 11.5–15.5)
WBC: 7.2 10*3/uL (ref 4.0–10.5)

## 2014-12-11 LAB — MICROALBUMIN / CREATININE URINE RATIO
Creatinine,U: 62.2 mg/dL
MICROALB UR: 3 mg/dL — AB (ref 0.0–1.9)
Microalb Creat Ratio: 4.8 mg/g (ref 0.0–30.0)

## 2014-12-11 LAB — BASIC METABOLIC PANEL
BUN: 14 mg/dL (ref 6–23)
CHLORIDE: 105 meq/L (ref 96–112)
CO2: 23 mEq/L (ref 19–32)
CREATININE: 0.78 mg/dL (ref 0.40–1.20)
Calcium: 9.7 mg/dL (ref 8.4–10.5)
GFR: 80.52 mL/min (ref >60.00)
GLUCOSE: 96 mg/dL (ref 70–99)
Potassium: 4.1 mEq/L (ref 3.5–5.1)
Sodium: 138 mEq/L (ref 135–145)

## 2014-12-11 LAB — LIPID PANEL
Cholesterol: 192 mg/dL (ref 0–200)
HDL: 37.5 mg/dL — AB (ref >39.00)
LDL Cholesterol: 127 mg/dL — ABNORMAL HIGH (ref 0–99)
NONHDL: 154.3
TRIGLYCERIDES: 135 mg/dL (ref 0.0–149.0)
Total CHOL/HDL Ratio: 5
VLDL: 27 mg/dL (ref 0.0–40.0)

## 2014-12-11 LAB — HEMOGLOBIN A1C: Hgb A1c MFr Bld: 6.3 % (ref 4.6–6.5)

## 2014-12-11 LAB — HEPATIC FUNCTION PANEL
ALBUMIN: 4.3 g/dL (ref 3.5–5.2)
ALK PHOS: 68 U/L (ref 39–117)
ALT: 63 U/L — ABNORMAL HIGH (ref 0–35)
AST: 34 U/L (ref 0–37)
BILIRUBIN DIRECT: 0.1 mg/dL (ref 0.0–0.3)
Total Bilirubin: 0.4 mg/dL (ref 0.2–1.2)
Total Protein: 7.4 g/dL (ref 6.0–8.3)

## 2014-12-11 NOTE — Progress Notes (Signed)
Pre-visit discussion using our clinic review tool. No additional management support is needed unless otherwise documented below in the visit note.  

## 2014-12-11 NOTE — Patient Instructions (Signed)
Last mammogram 03/28/14.

## 2014-12-11 NOTE — Progress Notes (Signed)
[name], female   DOB: [birth year], 59 y.o.   MRN: [id number]   Subjective:    [name], female    DOB: Aug 15, 1955, 59 y.o.   MRN: [id number]  HPI  Patient here for a scheduled follow up.  States she is doing well.  Handling stress.  No abdominal pain.  No bowel change.  Two weeks ago, had increased cough, congestion and wheezing.  Was seen at [facility].  Treated with zpak and prednisone.  Better now.  Some fatigue related to this, but improved.  Eating and drinking well.  She will schedule her mammogram.  While sick, did not exercise.  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     Past Medical History  Diagnosis Date  . Hypertension   . Hypercholesterolemia   . Migraine headache   . Recurrent sinusitis     allergies  . Abnormal liver function tests    No past surgical history on file. Family History  Problem Relation Age of Onset  . Heart disease Father     died age 4 (MI)  . Heart disease      4 uncles - CABG  . Heart disease Paternal Grandmother     first MI - 40s  . Migraines Mother   . Breast cancer Maternal Aunt   . Colon cancer      great aunt   Social History   Social History  . Marital Status: Married    Spouse Name: N/A  . Number of Children: 2  . Years of Education: N/A   Social History Main Topics  . Smoking status: Never Smoker   . Smokeless tobacco: Never Used  . Alcohol Use: 0.0 oz/week    0 Standard drinks or equivalent per week     Comment: occasional  . Drug Use: No  . Sexual Activity: Not Asked   Other Topics Concern  . None   Social History Narrative    Outpatient Encounter Prescriptions as of 12/11/2014  Medication Sig  . acetaminophen (TYLENOL) 500 MG tablet Take 1,000 mg by mouth every 8 (eight) hours as needed.  Marland Kitchen amLODipine (NORVASC) 10 MG tablet Take 1 tablet (10 mg total) by mouth daily.  . fexofenadine (ALLEGRA) 180 MG tablet Take 180 mg by mouth as needed.  . fluticasone (FLONASE) 50  MCG/ACT nasal spray Place 2 sprays into the nose daily as needed.   . hydrochlorothiazide (HYDRODIURIL) 25 MG tablet Take 1 tablet (25 mg total) by mouth daily.  Marland Kitchen ibuprofen (ADVIL,MOTRIN) 200 MG tablet Take 600-800 mg by mouth every 8 (eight) hours as needed.  Marland Kitchen lisinopril (PRINIVIL,ZESTRIL) 40 MG tablet Take 1 tablet (40 mg total) by mouth daily.  Marland Kitchen loratadine (CLARITIN) 10 MG tablet Take 20 mg by mouth daily.   . naproxen sodium (ANAPROX) 220 MG tablet Take 220 mg by mouth as needed.  Marland Kitchen PARoxetine (PAXIL) 10 MG tablet Take 1 tablet (10 mg total) by mouth daily.   No facility-administered encounter medications on file as of 12/11/2014.    Review of Systems  Constitutional: Negative for appetite change and unexpected weight change.  HENT: Negative for sinus pressure.        Congestion and drainage better.  Took zpak and prednisone.   Eyes: Negative for pain and visual disturbance.  Respiratory: Negative for cough, chest tightness and shortness of breath.   Cardiovascular: Negative for chest pain, palpitations and leg swelling.  Gastrointestinal: Negative for nausea, vomiting, abdominal  pain and diarrhea.  Genitourinary: Negative for dysuria and difficulty urinating.  Musculoskeletal: Negative for back pain and joint swelling.  Skin: Negative for color change and rash.  Neurological: Negative for dizziness, light-headedness and headaches.  Hematological: Negative for adenopathy. Does not bruise/bleed easily.  Psychiatric/Behavioral: Negative for dysphoric mood and agitation.       Objective:     Blood pressure rechecked by me:  114/74  Physical Exam  Constitutional: She appears well-developed and well-nourished. No distress.  HENT:  Nose: Nose normal.  Mouth/Throat: Oropharynx is clear and moist.  Eyes: Conjunctivae are normal. Right eye exhibits no discharge. Left eye exhibits no discharge.  Neck: Neck supple. No thyromegaly present.  Cardiovascular: Normal rate and regular  rhythm.   Pulmonary/Chest: Breath sounds normal. No respiratory distress. She has no wheezes.  No wheezing with forced expiration.   Abdominal: Soft. Bowel sounds are normal. There is no tenderness.  Musculoskeletal: She exhibits no edema or tenderness.  Lymphadenopathy:    She has no cervical adenopathy.  Skin: No rash noted. No erythema.  Psychiatric: She has a normal mood and affect. Her behavior is normal.    BP 100/80 mmHg  Pulse 83  Temp(Src) 98.1 F (36.7 C) (Oral)  Ht _0  (1.575 m)  Wt 195 lb 4 oz (88.565 kg)  BMI 35.70 kg/m2  SpO2 96% Wt Readings from Last 3 Encounters:  12/11/14 195 lb 4 oz (88.565 kg)  08/07/14 191 lb 6 oz (86.807 kg)  05/04/14 188 lb 4 oz (85.39 kg)     Lab Results  Component Value Date   WBC 7.2 12/11/2014   HGB 13.4 12/11/2014   HCT 40.1 12/11/2014   PLT 230.0 12/11/2014   GLUCOSE 96 12/11/2014   CHOL 192 12/11/2014   TRIG 135.0 12/11/2014   HDL 37.50* 12/11/2014   LDLDIRECT 153.0 05/04/2014   LDLCALC 127* 12/11/2014   ALT 63* 12/11/2014   AST 34 12/11/2014   NA 138 12/11/2014   K 4.1 12/11/2014   CL 105 12/11/2014   CREATININE 0.78 12/11/2014   BUN 14 12/11/2014   CO2 23 12/11/2014   TSH 1.49 08/07/2014   HGBA1C 6.3 12/11/2014   MICROALBUR 3.0* 12/11/2014       Assessment & Plan:   Problem List Items Addressed This Visit    Abnormal liver function tests    Previously worked up by Dr Candace Cruise.  Previous ultrasound revealed fatty liver.  CT with fatty liver.  Have discussed diet and exercise and weight loss.  Off lovaza.  She has declined f/u ultrasound.  See last note.        Relevant Orders   Hepatic function panel (Completed)   Hypercholesterolemia    Low cholesterol diet, exercise and weight loss.  Follow lipid panel.       Relevant Orders   Lipid panel (Completed)   Hyperglycemia    Low carb diet and exercise.  Follow met b and a1c.       Relevant Orders   Hemoglobin A1c (Completed)   Microalbumin / creatinine  urine ratio (Completed)   Hypertension    Blood pressure under good control.  Continue same medication regimen.  Follow pressures.  Follow metabolic panel.        Relevant Orders   CBC with Differential/Platelet (Completed)   Basic metabolic panel (Completed)   Migraine headache    Not reported as an issue currently.  Follow.       Obesity (BMI 30-39.9)    Diet and  exercise.  Follow.         Other Visit Diagnoses    Breast cancer screening    -  Primary    Relevant Orders    MM DIGITAL SCREENING BILATERAL        Einar Pheasant, MD

## 2014-12-17 ENCOUNTER — Encounter: Payer: Self-pay | Admitting: Internal Medicine

## 2014-12-17 NOTE — Assessment & Plan Note (Signed)
Low cholesterol diet, exercise and weight loss.  Follow lipid panel.

## 2014-12-17 NOTE — Assessment & Plan Note (Signed)
Low carb diet and exercise.  Follow met b and a1c.  

## 2014-12-17 NOTE — Assessment & Plan Note (Signed)
Not reported as an issue currently.  Follow.

## 2014-12-17 NOTE — Assessment & Plan Note (Signed)
Previously worked up by Dr Candace Cruise.  Previous ultrasound revealed fatty liver.  CT with fatty liver.  Have discussed diet and exercise and weight loss.  Off lovaza.  She has declined f/u ultrasound.  See last note.

## 2014-12-17 NOTE — Assessment & Plan Note (Signed)
Diet and exercise.  Follow.  

## 2014-12-17 NOTE — Assessment & Plan Note (Signed)
Blood pressure under good control.  Continue same medication regimen.  Follow pressures.  Follow metabolic panel.   

## 2015-01-02 ENCOUNTER — Encounter: Payer: Self-pay | Admitting: Internal Medicine

## 2015-02-26 ENCOUNTER — Encounter: Payer: Self-pay | Admitting: Physician Assistant

## 2015-02-26 ENCOUNTER — Ambulatory Visit: Payer: Self-pay | Admitting: Physician Assistant

## 2015-02-26 VITALS — BP 130/90 | HR 80 | Temp 98.1°F

## 2015-02-26 DIAGNOSIS — J069 Acute upper respiratory infection, unspecified: Secondary | ICD-10-CM

## 2015-02-26 MED ORDER — AZITHROMYCIN 250 MG PO TABS: ORAL_TABLET | ORAL | Status: DC

## 2015-02-26 NOTE — Progress Notes (Signed)
S: C/o runny nose and congestion for 3 days, cough and some expiratory wheezing, ?if had fever on Friday; since then no fever, chills, cp/sob, v/d; mucus is green, cough is sporadic,   Using otc meds: mucinex  O: PE: perrl eomi, normocephalic, tms dull, nasal mucosa red and swollen, throat injected, neck supple no lymph, lungs c t a, cv rrr, neuro intact  A:  Acute uri   P: zpack;  drink fluids, continue regular meds , use otc meds of choice, return if not improving in 5 days, return earlier if worsening

## 2015-02-27 ENCOUNTER — Telehealth: Payer: Self-pay | Admitting: Physician Assistant

## 2015-02-27 DIAGNOSIS — J209 Acute bronchitis, unspecified: Secondary | ICD-10-CM

## 2015-02-27 MED ORDER — ALBUTEROL SULFATE HFA 108 (90 BASE) MCG/ACT IN AERS: 2.0000 | INHALATION_SPRAY | Freq: Four times a day (QID) | RESPIRATORY_TRACT | Status: DC | PRN

## 2015-02-27 NOTE — Telephone Encounter (Signed)
Med ok'd for bronchitis

## 2015-02-27 NOTE — Telephone Encounter (Signed)
Patient was notified and medication sent to [place]

## 2015-03-21 ENCOUNTER — Encounter: Payer: Self-pay | Admitting: Physician Assistant

## 2015-03-21 ENCOUNTER — Ambulatory Visit: Payer: Self-pay | Admitting: Physician Assistant

## 2015-03-21 VITALS — BP 130/96 | HR 78 | Temp 98.2°F

## 2015-03-21 DIAGNOSIS — J069 Acute upper respiratory infection, unspecified: Secondary | ICD-10-CM

## 2015-03-21 MED ORDER — BENZONATATE 200 MG PO CAPS: 200.0000 mg | ORAL_CAPSULE | Freq: Three times a day (TID) | ORAL | Status: DC | PRN

## 2015-03-21 MED ORDER — AMOXICILLIN 875 MG PO TABS: 875.0000 mg | ORAL_TABLET | Freq: Two times a day (BID) | ORAL | Status: DC

## 2015-03-21 NOTE — Progress Notes (Signed)
S: C/o runny nose and congestion for 3 days, no fever, chills, cp/sob, v/d; mucus is green and thick, cough is sporadic, c/o of facial and dental pain. Was sick around thanksgiving finished zpack and felt better then sx started up again a week later, cough is worse at night,   Using otc meds:   O: PE: vitals wnl, nad, appears well, perrl eomi, normocephalic, tms dull, nasal mucosa red and swollen, throat injected, neck supple no lymph, lungs c t a, cv rrr, neuro intact  A:  Acute sinusitis   P: amoxil 875mg  bid x 10d, tessalon perls; drink fluids, continue regular meds , use otc meds of choice, return if not improving in 5 days, return earlier if worsening

## 2015-03-30 ENCOUNTER — Other Ambulatory Visit: Payer: Self-pay | Admitting: Internal Medicine

## 2015-03-30 ENCOUNTER — Ambulatory Visit
Admission: RE | Admit: 2015-03-30 | Discharge: 2015-03-30 | Disposition: A | Discharge status: Home / Self Care | Payer: 59 | Source: Ambulatory Visit | Attending: Internal Medicine | Admitting: Internal Medicine

## 2015-03-30 DIAGNOSIS — Z1231 Encounter for screening mammogram for malignant neoplasm of breast: Secondary | ICD-10-CM | POA: Insufficient documentation

## 2015-03-30 DIAGNOSIS — Z1239 Encounter for other screening for malignant neoplasm of breast: Secondary | ICD-10-CM

## 2015-04-01 HISTORY — PX: FRACTURE SURGERY: SHX138

## 2015-04-25 ENCOUNTER — Encounter: Payer: Self-pay | Admitting: Internal Medicine

## 2015-04-30 ENCOUNTER — Ambulatory Visit (INDEPENDENT_AMBULATORY_CARE_PROVIDER_SITE_OTHER): Payer: 59 | Admitting: Internal Medicine

## 2015-04-30 ENCOUNTER — Encounter: Payer: Self-pay | Admitting: Internal Medicine

## 2015-04-30 VITALS — BP 108/70 | HR 84 | Temp 98.8°F | Ht 62.0 in | Wt 193.1 lb

## 2015-04-30 DIAGNOSIS — R7989 Other specified abnormal findings of blood chemistry: Secondary | ICD-10-CM | POA: Diagnosis not present

## 2015-04-30 DIAGNOSIS — E7800 Pure hypercholesterolemia, unspecified: Secondary | ICD-10-CM | POA: Diagnosis not present

## 2015-04-30 DIAGNOSIS — M79672 Pain in left foot: Secondary | ICD-10-CM

## 2015-04-30 DIAGNOSIS — I10 Essential (primary) hypertension: Secondary | ICD-10-CM | POA: Diagnosis not present

## 2015-04-30 DIAGNOSIS — R739 Hyperglycemia, unspecified: Secondary | ICD-10-CM

## 2015-04-30 DIAGNOSIS — E669 Obesity, unspecified: Secondary | ICD-10-CM

## 2015-04-30 DIAGNOSIS — R945 Abnormal results of liver function studies: Secondary | ICD-10-CM

## 2015-04-30 LAB — HEPATIC FUNCTION PANEL
ALBUMIN: 4.3 g/dL (ref 3.5–5.2)
ALT: 58 U/L — AB (ref 0–35)
AST: 34 U/L (ref 0–37)
Alkaline Phosphatase: 73 U/L (ref 39–117)
Bilirubin, Direct: 0.1 mg/dL (ref 0.0–0.3)
TOTAL PROTEIN: 7.3 g/dL (ref 6.0–8.3)
Total Bilirubin: 0.4 mg/dL (ref 0.2–1.2)

## 2015-04-30 LAB — BASIC METABOLIC PANEL
BUN: 16 mg/dL (ref 6–23)
CALCIUM: 9.6 mg/dL (ref 8.4–10.5)
CHLORIDE: 104 meq/L (ref 96–112)
CO2: 27 meq/L (ref 19–32)
Creatinine, Ser: 0.81 mg/dL (ref 0.40–1.20)
GFR: 76.99 mL/min (ref >60.00)
Glucose, Bld: 98 mg/dL (ref 70–99)
Potassium: 4 mEq/L (ref 3.5–5.1)
SODIUM: 140 meq/L (ref 135–145)

## 2015-04-30 LAB — HEMOGLOBIN A1C: HEMOGLOBIN A1C: 6.3 % (ref 4.6–6.5)

## 2015-04-30 LAB — LIPID PANEL
CHOLESTEROL: 197 mg/dL (ref 0–200)
HDL: 36.4 mg/dL — ABNORMAL LOW (ref >39.00)
LDL Cholesterol: 131 mg/dL — ABNORMAL HIGH (ref 0–99)
NonHDL: 160.26
Total CHOL/HDL Ratio: 5
Triglycerides: 144 mg/dL (ref 0.0–149.0)
VLDL: 28.8 mg/dL (ref 0.0–40.0)

## 2015-04-30 MED ORDER — AMLODIPINE BESYLATE 10 MG PO TABS: 10.0000 mg | ORAL_TABLET | Freq: Every day | ORAL | Status: DC

## 2015-04-30 MED ORDER — LISINOPRIL 40 MG PO TABS: 40.0000 mg | ORAL_TABLET | Freq: Every day | ORAL | Status: DC

## 2015-04-30 NOTE — Assessment & Plan Note (Signed)
Blood pressure under good control.  Continue same medication regimen.  Follow pressures.  Follow metabolic panel.   

## 2015-04-30 NOTE — Progress Notes (Signed)
[name], female   DOB: [birth year], 60 y.o.   MRN: [id number]   Subjective:    [name], female    DOB: 1955-04-13, 60 y.o.   MRN: [id number]  HPI  Patient with past history of hypercholesterolemia, migraine headaches, abnormal liver function tests and hypertension.  She comes in today to follow up on these issues.  She feels she is doing well.  Had respiratory issues in December.  This has resolved.  No sob.  No cough or congestion.  Exercises when she is at work.  Goes to the gym.  No cardiac symptoms with increased activity or exertion.  No acid reflux.  No abdominal pain or cramping.  Bowels stable.  Having pain with her morton's neuroma.  Previous injection helped.  She is due to f/u with Dr Cleda Mccreedy in two days. Only drinks one beer per week.  Handling stress well.     Past Medical History  Diagnosis Date  . Hypertension   . Hypercholesterolemia   . Migraine headache   . Recurrent sinusitis     allergies  . Abnormal liver function tests    Past Surgical History  Procedure Laterality Date  . Breast biopsy Left 2004    x 2   Family History  Problem Relation Age of Onset  . Heart disease Father     died age 49 (MI)  . Heart disease      4 uncles - CABG  . Heart disease Paternal Grandmother     first MI - 74s  . Migraines Mother   . Breast cancer Mother 62  . Colon cancer      great aunt   Social History   Social History  . Marital Status: Married    Spouse Name: N/A  . Number of Children: 2  . Years of Education: N/A   Social History Main Topics  . Smoking status: Never Smoker   . Smokeless tobacco: Never Used  . Alcohol Use: 0.0 oz/week    0 Standard drinks or equivalent per week     Comment: occasional  . Drug Use: No  . Sexual Activity: Not Asked   Other Topics Concern  . None   Social History Narrative    Outpatient Encounter Prescriptions as of 04/30/2015  Medication Sig  . acetaminophen (TYLENOL) 500 MG tablet Take  1,000 mg by mouth every 8 (eight) hours as needed.  Marland Kitchen amLODipine (NORVASC) 10 MG tablet Take 1 tablet (10 mg total) by mouth daily.  . fexofenadine (ALLEGRA) 180 MG tablet Take 180 mg by mouth as needed.  . fluticasone (FLONASE) 50 MCG/ACT nasal spray Place 2 sprays into the nose daily as needed.   . hydrochlorothiazide (HYDRODIURIL) 25 MG tablet Take 1 tablet (25 mg total) by mouth daily.  Marland Kitchen ibuprofen (ADVIL,MOTRIN) 200 MG tablet Take 600-800 mg by mouth every 8 (eight) hours as needed.  Marland Kitchen lisinopril (PRINIVIL,ZESTRIL) 40 MG tablet Take 1 tablet (40 mg total) by mouth daily.  Marland Kitchen loratadine (CLARITIN) 10 MG tablet Take 20 mg by mouth daily.   . naproxen sodium (ANAPROX) 220 MG tablet Take 220 mg by mouth as needed.  Marland Kitchen PARoxetine (PAXIL) 10 MG tablet Take 1 tablet (10 mg total) by mouth daily.  . [DISCONTINUED] albuterol (PROVENTIL HFA;VENTOLIN HFA) 108 (90 BASE) MCG/ACT inhaler Inhale 2 puffs into the lungs every 6 (six) hours as needed for wheezing or shortness of breath.  . [DISCONTINUED] amLODipine (NORVASC) 10 MG tablet Take 1  tablet (10 mg total) by mouth daily.  . [DISCONTINUED] benzonatate (TESSALON) 200 MG capsule Take 1 capsule (200 mg total) by mouth 3 (three) times daily as needed for cough.  . [DISCONTINUED] lisinopril (PRINIVIL,ZESTRIL) 40 MG tablet Take 1 tablet (40 mg total) by mouth daily.  . [DISCONTINUED] amoxicillin (AMOXIL) 875 MG tablet Take 1 tablet (875 mg total) by mouth 2 (two) times daily. (Patient not taking: Reported on 04/30/2015)  . [DISCONTINUED] azithromycin (ZITHROMAX Z-PAK) 250 MG tablet 2 pills today then 1 pill a day for 4 days (Patient not taking: Reported on 03/21/2015)   No facility-administered encounter medications on file as of 04/30/2015.    Review of Systems  Constitutional: Negative for appetite change and unexpected weight change.  HENT: Negative for congestion and sinus pressure.   Eyes: Negative for discharge and redness.  Respiratory: Negative for  cough, chest tightness and shortness of breath.   Cardiovascular: Negative for chest pain, palpitations and leg swelling.  Gastrointestinal: Negative for nausea, vomiting, abdominal pain and diarrhea.  Genitourinary: Negative for dysuria and difficulty urinating.  Musculoskeletal: Negative for back pain and joint swelling.  Skin: Negative for color change and rash.  Neurological: Negative for dizziness, light-headedness and headaches.  Psychiatric/Behavioral: Negative for dysphoric mood and agitation.       Objective:     Blood pressure rechecked by me:  112/76  Physical Exam  Constitutional: She appears well-developed and well-nourished. No distress.  HENT:  Nose: Nose normal.  Mouth/Throat: Oropharynx is clear and moist.  Eyes: Conjunctivae are normal. Right eye exhibits no discharge. Left eye exhibits no discharge.  Neck: Neck supple. No thyromegaly present.  Cardiovascular: Normal rate and regular rhythm.   Pulmonary/Chest: Breath sounds normal. No respiratory distress. She has no wheezes.  Abdominal: Soft. Bowel sounds are normal. There is no tenderness.  Musculoskeletal: She exhibits no edema or tenderness.  Lymphadenopathy:    She has no cervical adenopathy.  Skin: No rash noted. No erythema.  Psychiatric: She has a normal mood and affect. Her behavior is normal.    BP 108/70 mmHg  Pulse 84  Temp(Src) 98.8 F (37.1 C) (Oral)  Ht _0  (1.575 m)  Wt 193 lb 2 oz (87.601 kg)  BMI 35.31 kg/m2  SpO2 96% Wt Readings from Last 3 Encounters:  04/30/15 193 lb 2 oz (87.601 kg)  12/11/14 195 lb 4 oz (88.565 kg)  08/07/14 191 lb 6 oz (86.807 kg)     Lab Results  Component Value Date   WBC 7.2 12/11/2014   HGB 13.4 12/11/2014   HCT 40.1 12/11/2014   PLT 230.0 12/11/2014   GLUCOSE 98 04/30/2015   CHOL 197 04/30/2015   TRIG 144.0 04/30/2015   HDL 36.40* 04/30/2015   LDLDIRECT 153.0 05/04/2014   LDLCALC 131* 04/30/2015   ALT 58* 04/30/2015   AST 34 04/30/2015   NA  140 04/30/2015   K 4.0 04/30/2015   CL 104 04/30/2015   CREATININE 0.81 04/30/2015   BUN 16 04/30/2015   CO2 27 04/30/2015   TSH 1.49 08/07/2014   HGBA1C 6.3 04/30/2015   MICROALBUR 3.0* 12/11/2014    Mm Screening Breast Tomo Bilateral  03/30/2015  CLINICAL DATA:  Screening. EXAM: DIGITAL SCREENING BILATERAL MAMMOGRAM WITH 3D TOMO WITH CAD COMPARISON:  Previous exam(s). ACR Breast Density Category c: The breast tissue is heterogeneously dense, which may obscure small masses. FINDINGS: There are no findings suspicious for malignancy. Images were processed with CAD. IMPRESSION: No mammographic evidence of malignancy. A result  letter of this screening mammogram will be mailed directly to the patient. RECOMMENDATION: Screening mammogram in one year. (Code:SM-B-01Y) BI-RADS CATEGORY  1: Negative. Electronically Signed   By: Marin Olp M.D.   On: 03/30/2015 09:50       Assessment & Plan:   Problem List Items Addressed This Visit    Abnormal liver function tests    Previously worked up by Dr Candace Cruise.  Previous ultrasound revealed fatty liver.  CT with fatty liver.  Have discussed diet and exercise.  Off lovaza.  Again discussed with her today.  She declines f/u ultrasound.  Follow.        Relevant Orders   Hepatic function panel (Completed)   Hepatitis C antibody   Foot pain    Morton's neuroma.  Seeing podiatry.  Due to see Dr Cleda Mccreedy this week.        Hypercholesterolemia    Low cholesterol diet and exercise.  Follow lipid panel.        Relevant Medications   lisinopril (PRINIVIL,ZESTRIL) 40 MG tablet   amLODipine (NORVASC) 10 MG tablet   Other Relevant Orders   Lipid panel (Completed)   Hyperglycemia    Low carb diet and exercise.  Follow met b and a1c.        Relevant Orders   Hemoglobin A1c (Completed)   Hypertension - Primary    Blood pressure under good control.  Continue same medication regimen.  Follow pressures.  Follow metabolic panel.        Relevant Medications    lisinopril (PRINIVIL,ZESTRIL) 40 MG tablet   amLODipine (NORVASC) 10 MG tablet   Other Relevant Orders   Basic metabolic panel (Completed)   Obesity (BMI 30-39.9)    Diet and exercise.           Einar Pheasant, MD

## 2015-04-30 NOTE — Progress Notes (Signed)
Pre visit review using our clinic review tool, if applicable. No additional management support is needed unless otherwise documented below in the visit note. 

## 2015-05-01 ENCOUNTER — Encounter: Payer: Self-pay | Admitting: Internal Medicine

## 2015-05-02 DIAGNOSIS — M79672 Pain in left foot: Secondary | ICD-10-CM | POA: Diagnosis not present

## 2015-05-07 ENCOUNTER — Encounter: Payer: Self-pay | Admitting: Internal Medicine

## 2015-05-07 NOTE — Assessment & Plan Note (Signed)
Diet and exercise.   

## 2015-05-07 NOTE — Assessment & Plan Note (Signed)
Low cholesterol diet and exercise.  Follow lipid panel.   

## 2015-05-07 NOTE — Assessment & Plan Note (Signed)
Low carb diet and exercise.  Follow met b and a1c.   

## 2015-05-07 NOTE — Assessment & Plan Note (Signed)
Morton's neuroma.  Seeing podiatry.  Due to see Dr Cleda Mccreedy this week.

## 2015-05-07 NOTE — Assessment & Plan Note (Signed)
Previously worked up by Dr Candace Cruise.  Previous ultrasound revealed fatty liver.  CT with fatty liver.  Have discussed diet and exercise.  Off lovaza.  Again discussed with her today.  She declines f/u ultrasound.  Follow.

## 2015-05-08 ENCOUNTER — Telehealth: Payer: Self-pay | Admitting: Internal Medicine

## 2015-05-08 NOTE — Telephone Encounter (Signed)
I can see her for pre op evaluation.

## 2015-05-08 NOTE — Telephone Encounter (Signed)
Pt is not due for CPE until after 59/17. She does not need a physical for pre-op. However, she may need a follow-up appt prior to surgery. I will forward this message to Dr. Nicki Reaper for a date & time. Pt was last seen on 04/30/15.

## 2015-05-08 NOTE — Telephone Encounter (Signed)
Needs a physical, anytime they have surgery coming up

## 2015-05-08 NOTE — Telephone Encounter (Signed)
Ok please schedule

## 2015-05-08 NOTE — Telephone Encounter (Signed)
Pt needs a physical appt. Where can I schedule pt? Or is it ok to schedule pt with another provider? Let me know?

## 2015-05-08 NOTE — Telephone Encounter (Signed)
Ok. Pt is scheduled for 05/14/2015 @ 8am. Thank you!

## 2015-05-08 NOTE — Telephone Encounter (Signed)
Pt called about needing a H&P filled out before surgery of 06/01/2015. Pt states she needs her heart and lungs to be listened to. Can pt be put on the nurse schedule? Or does she need a provider appt? Call pt @ work [phone] or home  [phone]. Thank you!

## 2015-05-08 NOTE — Telephone Encounter (Signed)
Please schedule a 30 min appt for pre-op/surgical clearance

## 2015-05-14 ENCOUNTER — Ambulatory Visit (INDEPENDENT_AMBULATORY_CARE_PROVIDER_SITE_OTHER): Payer: 59 | Admitting: Internal Medicine

## 2015-05-14 ENCOUNTER — Encounter: Payer: Self-pay | Admitting: Internal Medicine

## 2015-05-14 VITALS — BP 100/78 | HR 83 | Temp 97.8°F | Resp 18 | Ht 62.0 in | Wt 194.4 lb

## 2015-05-14 DIAGNOSIS — I10 Essential (primary) hypertension: Secondary | ICD-10-CM | POA: Diagnosis not present

## 2015-05-14 DIAGNOSIS — M79672 Pain in left foot: Secondary | ICD-10-CM

## 2015-05-14 DIAGNOSIS — Z01818 Encounter for other preprocedural examination: Secondary | ICD-10-CM

## 2015-05-14 NOTE — Assessment & Plan Note (Addendum)
Blood pressure under good control.  Continue same medication regimen.  Follow pressures.  Follow metabolic panel.  Will need close intraop and post op monitoring of her heart rate and blood pressure to avoid extremes.   

## 2015-05-14 NOTE — Progress Notes (Signed)
[name], female   DOB: 1955/11/06, 60 y.o.   MRN: [id number]   Subjective:    [name], female    DOB: Dec 26, 1955, 60 y.o.   MRN: [id number]  HPI  Patient here for a pre op evaluation.  She is planning to have foot surgery 06/01/15 (Dr Cleda Mccreedy).  Has neuroma.  Increased pain.  Planning for surgery.  Exercises.  No cardiac symptoms with increased activity or exertion.  No sob.  No acid reflux.  No abdominal pain or cramping.  Some minimal allergy issues, but no significant sinus pressure or nasal congestion.  No chest congestion. Bowels stable.  No diarrhea.  No urinary symptoms.     Past Medical History  Diagnosis Date  . Hypertension   . Hypercholesterolemia   . Migraine headache   . Recurrent sinusitis     allergies  . Abnormal liver function tests    Past Surgical History  Procedure Laterality Date  . Breast biopsy Left 2004    x 2   Family History  Problem Relation Age of Onset  . Heart disease Father     died age 53 ([place])  . Heart disease      4 uncles - CABG  . Heart disease Paternal Grandmother     first MI - 17s  . Migraines Mother   . Breast cancer Mother 48  . Colon cancer      great aunt   Social History   Social History  . Marital Status: Married    Spouse Name: N/A  . Number of Children: 2  . Years of Education: N/A   Social History Main Topics  . Smoking status: Never Smoker   . Smokeless tobacco: Never Used  . Alcohol Use: 0.0 oz/week    0 Standard drinks or equivalent per week     Comment: occasional  . Drug Use: No  . Sexual Activity: Not Asked   Other Topics Concern  . None   Social History Narrative    Outpatient Encounter Prescriptions as of 05/14/2015  Medication Sig  . acetaminophen (TYLENOL) 500 MG tablet Take 1,000 mg by mouth every 8 (eight) hours as needed.  Marland Kitchen amLODipine (NORVASC) 10 MG tablet Take 1 tablet (10 mg total) by mouth daily.  . fexofenadine-pseudoephedrine (ALLEGRA-D) 60-120 MG 12 hr  tablet Take 1 tablet by mouth daily as needed.  . fluticasone (FLONASE) 50 MCG/ACT nasal spray Place 2 sprays into the nose daily as needed.   . hydrochlorothiazide (HYDRODIURIL) 25 MG tablet Take 1 tablet (25 mg total) by mouth daily.  Marland Kitchen ibuprofen (ADVIL,MOTRIN) 200 MG tablet Take 600-800 mg by mouth every 8 (eight) hours as needed.  Marland Kitchen lisinopril (PRINIVIL,ZESTRIL) 40 MG tablet Take 1 tablet (40 mg total) by mouth daily.  Marland Kitchen loratadine (CLARITIN) 10 MG tablet Take 20 mg by mouth daily.   . naproxen sodium (ANAPROX) 220 MG tablet Take 220 mg by mouth as needed.  Marland Kitchen PARoxetine (PAXIL) 10 MG tablet Take 1 tablet (10 mg total) by mouth daily.  . [DISCONTINUED] fexofenadine (ALLEGRA) 180 MG tablet Take 180 mg by mouth as needed.   No facility-administered encounter medications on file as of 05/14/2015.    Review of Systems  Constitutional: Negative for fever, appetite change and unexpected weight change.  HENT: Negative for sinus pressure and sore throat.        Some minimal allergy symptoms.  No sinus pressure.  No sore throat.    Respiratory: Negative  for cough, chest tightness and shortness of breath.   Cardiovascular: Negative for chest pain, palpitations and leg swelling.  Gastrointestinal: Negative for nausea, vomiting, abdominal pain and diarrhea.  Genitourinary: Negative for dysuria and difficulty urinating.  Musculoskeletal: Negative for back pain and joint swelling.  Skin: Negative for color change and rash.  Neurological: Negative for dizziness, light-headedness and headaches.  Psychiatric/Behavioral: Negative for dysphoric mood and agitation.       Objective:     Blood pressure rechecked by me:  112/76  Physical Exam  Constitutional: She appears well-developed and well-nourished. No distress.  HENT:  Nose: Nose normal.  Mouth/Throat: Oropharynx is clear and moist.  Neck: Neck supple. No thyromegaly present.  Cardiovascular: Normal rate and regular rhythm.     Pulmonary/Chest: Breath sounds normal. No respiratory distress. She has no wheezes.  Abdominal: Soft. Bowel sounds are normal. There is no tenderness.  Musculoskeletal: She exhibits no edema or tenderness.  Lymphadenopathy:    She has no cervical adenopathy.  Skin: No rash noted. No erythema.  Psychiatric: She has a normal mood and affect. Her behavior is normal.    BP 100/78 mmHg  Pulse 83  Temp(Src) 97.8 F (36.6 C) (Oral)  Resp 18  Ht 5\' 2"  (1.575 m)  Wt 194 lb 6 oz (88.168 kg)  BMI 35.54 kg/m2  SpO2 96% Wt Readings from Last 3 Encounters:  05/14/15 194 lb 6 oz (88.168 kg)  04/30/15 193 lb 2 oz (87.601 kg)  12/11/14 195 lb 4 oz (88.565 kg)     Lab Results  Component Value Date   WBC 7.2 12/11/2014   HGB 13.4 12/11/2014   HCT 40.1 12/11/2014   PLT 230.0 12/11/2014   GLUCOSE 98 04/30/2015   CHOL 197 04/30/2015   TRIG 144.0 04/30/2015   HDL 36.40* 04/30/2015   LDLDIRECT 153.0 05/04/2014   LDLCALC 131* 04/30/2015   ALT 58* 04/30/2015   AST 34 04/30/2015   NA 140 04/30/2015   K 4.0 04/30/2015   CL 104 04/30/2015   CREATININE 0.81 04/30/2015   BUN 16 04/30/2015   CO2 27 04/30/2015   TSH 1.49 08/07/2014   HGBA1C 6.3 04/30/2015   MICROALBUR 3.0* 12/11/2014        Assessment & Plan:   Problem List Items Addressed This Visit    Foot pain    Planning for foot surgery.        Hypertension - Primary    Blood pressure under good control.  Continue same medication regimen.  Follow pressures.  Follow metabolic panel.  Will need close intra op and post op monitoring of her heart rate and blood pressure to avoid extremes.        Pre-op evaluation    Other Visit Diagnoses    Pre-operative clearance        Relevant Orders    EKG 12-Lead (Completed)        Einar Pheasant, MD

## 2015-05-14 NOTE — Progress Notes (Signed)
Pre-visit discussion using our clinic review tool. No additional management support is needed unless otherwise documented below in the visit note.  

## 2015-05-21 ENCOUNTER — Encounter: Payer: Self-pay | Admitting: Internal Medicine

## 2015-05-28 ENCOUNTER — Telehealth: Payer: Self-pay | Admitting: Internal Medicine

## 2015-05-28 ENCOUNTER — Encounter: Payer: Self-pay | Admitting: Internal Medicine

## 2015-05-28 DIAGNOSIS — G5762 Lesion of plantar nerve, left lower limb: Secondary | ICD-10-CM | POA: Diagnosis not present

## 2015-05-28 DIAGNOSIS — S92512S Displaced fracture of proximal phalanx of left lesser toe(s), sequela: Secondary | ICD-10-CM | POA: Diagnosis not present

## 2015-05-28 DIAGNOSIS — Z01818 Encounter for other preprocedural examination: Secondary | ICD-10-CM | POA: Insufficient documentation

## 2015-05-28 NOTE — Telephone Encounter (Signed)
Please be advised of the EKG sent early.

## 2015-05-28 NOTE — Telephone Encounter (Signed)
She meant that for her husband who is Dr.Cook's patient.

## 2015-05-28 NOTE — Telephone Encounter (Signed)
Sandy [phone] called from Dr Lorinda Creed office regarding EKG which he is not convinced that it is Q wave inferiorly that it's ok to proceed. Thank you!

## 2015-05-28 NOTE — Telephone Encounter (Signed)
Please advise, thanks.

## 2015-05-28 NOTE — Telephone Encounter (Signed)
I am not sure what she is talking about - letter of appeal.  Need more information.  I received and reviewed the information regarding the EKG and form completed for surgery.  Form placed in Kathleen Yu's box to fax to podiatry.

## 2015-05-28 NOTE — Telephone Encounter (Signed)
Kathleen Yu called back and stated that she wanted to talk to [facility] about a letter that was faxed over "Letter of Appeal" . Do you have this form, she states that she wants Dr. Lacinda Axon to write a letter of appeal.? She also states that she was not calling about a prescription.

## 2015-05-28 NOTE — Assessment & Plan Note (Signed)
Planning for foot surgery.

## 2015-05-29 ENCOUNTER — Inpatient Hospital Stay: Admission: RE | Admit: 2015-05-29 | Payer: Self-pay | Source: Ambulatory Visit

## 2015-05-29 ENCOUNTER — Encounter: Payer: Self-pay | Admitting: *Deleted

## 2015-05-29 NOTE — Patient Instructions (Signed)
  Your procedure is scheduled on: 06-01-15 (FRIDAY) Report to [facility]. To find out your arrival time please call [phone] between 1PM - 3PM on 05-31-15 [facility])  Remember: Instructions that are not followed completely may result in serious medical risk, up to and including death, or upon the discretion of your surgeon and anesthesiologist your surgery may need to be rescheduled.    _X___ 1. Do not eat food or drink liquids after midnight. No gum chewing or hard candies.     _X___ 2. No Alcohol for 24 hours before or after surgery.   ____ 3. Bring all medications with you on the day of surgery if instructed.    _X___ 4. Notify your doctor if there is any change in your medical condition     (cold, fever, infections).     Do not wear jewelry, make-up, hairpins, clips or nail polish.  Do not wear lotions, powders, or perfumes. You may wear deodorant.  Do not shave 48 hours prior to surgery. Men may shave face and neck.  Do not bring valuables to the hospital.    [facility] is not responsible for any belongings or valuables.               Contacts, dentures or bridgework may not be worn into surgery.  Leave your suitcase in the car. After surgery it may be brought to your room.  For patients admitted to the hospital, discharge time is determined by your treatment team.   Patients discharged the day of surgery will not be allowed to drive home.   Please read over the following fact sheets that you were given:     _X___ Take these medicines the morning of surgery with A SIP OF WATER:    1. AMLODIPINE  2. LISINOPRIL  3. PAXIL  4.  5.  6.  ____ Fleet Enema (as directed)   _X___ Use CHG Soap as directed  ____ Use inhalers on the day of surgery  ____ Stop metformin 2 days prior to surgery    ____ Take 1/2 of usual insulin dose the night before surgery and none on the morning of surgery.   ____ Stop Coumadin/Plavix/aspirin-NA/  _X___ Stop  Anti-inflammatories-STOP NAPROXEN AND IBUPROFEN NOW-NO NSAIDS OR ASA PRODUCTS-TYLENOL OK TO TAKE   ____ Stop supplements until after surgery.    ____ Bring C-Pap to the hospital.

## 2015-05-30 ENCOUNTER — Encounter
Admission: RE | Admit: 2015-05-30 | Discharge: 2015-05-30 | Disposition: A | Discharge status: Home / Self Care | Payer: 59 | Source: Ambulatory Visit | Attending: Podiatry | Admitting: Podiatry

## 2015-05-30 DIAGNOSIS — I10 Essential (primary) hypertension: Secondary | ICD-10-CM | POA: Diagnosis not present

## 2015-05-30 DIAGNOSIS — E7800 Pure hypercholesterolemia, unspecified: Secondary | ICD-10-CM | POA: Diagnosis not present

## 2015-05-30 DIAGNOSIS — G5762 Lesion of plantar nerve, left lower limb: Secondary | ICD-10-CM | POA: Diagnosis not present

## 2015-05-30 DIAGNOSIS — G43909 Migraine, unspecified, not intractable, without status migrainosus: Secondary | ICD-10-CM | POA: Diagnosis not present

## 2015-05-30 DIAGNOSIS — Z800 Family history of malignant neoplasm of digestive organs: Secondary | ICD-10-CM | POA: Diagnosis not present

## 2015-05-30 DIAGNOSIS — Z8249 Family history of ischemic heart disease and other diseases of the circulatory system: Secondary | ICD-10-CM | POA: Diagnosis not present

## 2015-05-30 DIAGNOSIS — J329 Chronic sinusitis, unspecified: Secondary | ICD-10-CM | POA: Diagnosis not present

## 2015-05-30 DIAGNOSIS — Z1889 Other specified retained foreign body fragments: Secondary | ICD-10-CM | POA: Diagnosis not present

## 2015-05-30 DIAGNOSIS — M1990 Unspecified osteoarthritis, unspecified site: Secondary | ICD-10-CM | POA: Diagnosis not present

## 2015-05-30 DIAGNOSIS — Z820 Family history of epilepsy and other diseases of the nervous system: Secondary | ICD-10-CM | POA: Diagnosis not present

## 2015-05-30 DIAGNOSIS — Z79899 Other long term (current) drug therapy: Secondary | ICD-10-CM | POA: Diagnosis not present

## 2015-05-30 LAB — CBC
HCT: 38.9 % (ref 35.0–47.0)
HEMOGLOBIN: 13.2 g/dL (ref 12.0–16.0)
MCH: 27.8 pg (ref 26.0–34.0)
MCHC: 33.8 g/dL (ref 32.0–36.0)
MCV: 82.4 fL (ref 80.0–100.0)
PLATELETS: 231 10*3/uL (ref 150–440)
RBC: 4.73 MIL/uL (ref 3.80–5.20)
RDW: 14.5 % (ref 11.5–14.5)
WBC: 6.7 10*3/uL (ref 3.6–11.0)

## 2015-05-30 LAB — DIFFERENTIAL
BASOS PCT: 1 %
Basophils Absolute: 0 10*3/uL (ref 0–0.1)
EOS ABS: 0.2 10*3/uL (ref 0–0.7)
EOS PCT: 3 %
Lymphocytes Relative: 35 %
Lymphs Abs: 2.3 10*3/uL (ref 1.0–3.6)
MONO ABS: 0.7 10*3/uL (ref 0.2–0.9)
MONOS PCT: 10 %
NEUTROS ABS: 3.5 10*3/uL (ref 1.4–6.5)
NEUTROS PCT: 51 %

## 2015-06-01 ENCOUNTER — Ambulatory Visit: Payer: 59 | Admitting: Certified Registered Nurse Anesthetist

## 2015-06-01 ENCOUNTER — Ambulatory Visit
Admission: RE | Admit: 2015-06-01 | Discharge: 2015-06-01 | Disposition: A | Discharge status: Home / Self Care | Payer: 59 | Source: Ambulatory Visit | Attending: Podiatry | Admitting: Podiatry

## 2015-06-01 ENCOUNTER — Encounter
Admission: RE | Disposition: A | Discharge status: Home / Self Care | Payer: Self-pay | Source: Ambulatory Visit | Attending: Podiatry

## 2015-06-01 ENCOUNTER — Encounter: Payer: Self-pay | Admitting: *Deleted

## 2015-06-01 DIAGNOSIS — Z820 Family history of epilepsy and other diseases of the nervous system: Secondary | ICD-10-CM | POA: Diagnosis not present

## 2015-06-01 DIAGNOSIS — Z8249 Family history of ischemic heart disease and other diseases of the circulatory system: Secondary | ICD-10-CM | POA: Diagnosis not present

## 2015-06-01 DIAGNOSIS — Z1889 Other specified retained foreign body fragments: Secondary | ICD-10-CM | POA: Insufficient documentation

## 2015-06-01 DIAGNOSIS — M84478A Pathological fracture, left toe(s), initial encounter for fracture: Secondary | ICD-10-CM | POA: Diagnosis not present

## 2015-06-01 DIAGNOSIS — G43909 Migraine, unspecified, not intractable, without status migrainosus: Secondary | ICD-10-CM | POA: Diagnosis not present

## 2015-06-01 DIAGNOSIS — S92512K Displaced fracture of proximal phalanx of left lesser toe(s), subsequent encounter for fracture with nonunion: Secondary | ICD-10-CM | POA: Diagnosis not present

## 2015-06-01 DIAGNOSIS — Z800 Family history of malignant neoplasm of digestive organs: Secondary | ICD-10-CM | POA: Diagnosis not present

## 2015-06-01 DIAGNOSIS — J329 Chronic sinusitis, unspecified: Secondary | ICD-10-CM | POA: Insufficient documentation

## 2015-06-01 DIAGNOSIS — Z79899 Other long term (current) drug therapy: Secondary | ICD-10-CM | POA: Insufficient documentation

## 2015-06-01 DIAGNOSIS — G5762 Lesion of plantar nerve, left lower limb: Secondary | ICD-10-CM | POA: Diagnosis not present

## 2015-06-01 DIAGNOSIS — I10 Essential (primary) hypertension: Secondary | ICD-10-CM | POA: Insufficient documentation

## 2015-06-01 DIAGNOSIS — M1990 Unspecified osteoarthritis, unspecified site: Secondary | ICD-10-CM | POA: Insufficient documentation

## 2015-06-01 DIAGNOSIS — S92912A Unspecified fracture of left toe(s), initial encounter for closed fracture: Secondary | ICD-10-CM | POA: Diagnosis not present

## 2015-06-01 DIAGNOSIS — M2062 Acquired deformities of toe(s), unspecified, left foot: Secondary | ICD-10-CM | POA: Diagnosis not present

## 2015-06-01 DIAGNOSIS — E7800 Pure hypercholesterolemia, unspecified: Secondary | ICD-10-CM | POA: Insufficient documentation

## 2015-06-01 DIAGNOSIS — D649 Anemia, unspecified: Secondary | ICD-10-CM | POA: Diagnosis not present

## 2015-06-01 HISTORY — PX: OSTECTOMY: SHX6439

## 2015-06-01 HISTORY — PX: EXCISION MORTON'S NEUROMA: SHX5013

## 2015-06-01 HISTORY — DX: Unspecified osteoarthritis, unspecified site: M19.90

## 2015-06-01 HISTORY — DX: Anemia, unspecified: D64.9

## 2015-06-01 SURGERY — EXCISION, MORTON'S NEUROMA
Anesthesia: General | Site: Foot | Laterality: Left | Wound class: Clean

## 2015-06-01 MED ORDER — HYDROMORPHONE HCL 1 MG/ML IJ SOLN: 0.2500 mg | INTRAMUSCULAR | Status: DC | PRN

## 2015-06-01 MED ORDER — ACETAMINOPHEN 10 MG/ML IV SOLN
INTRAVENOUS | Status: DC | PRN
  Administered 2015-06-01: 1000 mg via INTRAVENOUS

## 2015-06-01 MED ORDER — FAMOTIDINE 20 MG PO TABS
ORAL_TABLET | ORAL | Status: AC
  Filled 2015-06-01: qty 1

## 2015-06-01 MED ORDER — BUPIVACAINE HCL (PF) 0.5 % IJ SOLN
INTRAMUSCULAR | Status: AC
  Filled 2015-06-01: qty 30

## 2015-06-01 MED ORDER — PROPOFOL 500 MG/50ML IV EMUL
INTRAVENOUS | Status: DC | PRN
  Administered 2015-06-01: 50 ug/kg/min via INTRAVENOUS

## 2015-06-01 MED ORDER — ONDANSETRON HCL 4 MG/2ML IJ SOLN
INTRAMUSCULAR | Status: DC | PRN
  Administered 2015-06-01: 4 mg via INTRAVENOUS

## 2015-06-01 MED ORDER — ONDANSETRON HCL 4 MG/2ML IJ SOLN: 4.0000 mg | Freq: Once | INTRAMUSCULAR | Status: DC | PRN

## 2015-06-01 MED ORDER — EPHEDRINE SULFATE 50 MG/ML IJ SOLN
INTRAMUSCULAR | Status: DC | PRN
  Administered 2015-06-01: 5 mg via INTRAVENOUS

## 2015-06-01 MED ORDER — DEXAMETHASONE SODIUM PHOSPHATE 10 MG/ML IJ SOLN
INTRAMUSCULAR | Status: DC | PRN
  Administered 2015-06-01: 5 mg via INTRAVENOUS

## 2015-06-01 MED ORDER — PROPOFOL 10 MG/ML IV BOLUS
INTRAVENOUS | Status: DC | PRN
  Administered 2015-06-01: 40 mg via INTRAVENOUS

## 2015-06-01 MED ORDER — OXYCODONE-ACETAMINOPHEN 5-325 MG PO TABS: 1.0000 | ORAL_TABLET | ORAL | Status: DC | PRN

## 2015-06-01 MED ORDER — ACETAMINOPHEN 10 MG/ML IV SOLN
INTRAVENOUS | Status: AC
Start: 2015-06-01 — End: 2015-06-01
  Filled 2015-06-01: qty 100

## 2015-06-01 MED ORDER — BUPIVACAINE HCL (PF) 0.5 % IJ SOLN
INTRAMUSCULAR | Status: DC | PRN
  Administered 2015-06-01: 10 mL

## 2015-06-01 MED ORDER — MIDAZOLAM HCL 2 MG/2ML IJ SOLN
INTRAMUSCULAR | Status: DC | PRN
  Administered 2015-06-01 (×2): 1 mg via INTRAVENOUS

## 2015-06-01 MED ORDER — FENTANYL CITRATE (PF) 100 MCG/2ML IJ SOLN
INTRAMUSCULAR | Status: DC | PRN
  Administered 2015-06-01 (×2): 25 ug via INTRAVENOUS

## 2015-06-01 MED ORDER — LACTATED RINGERS IV SOLN
INTRAVENOUS | Status: DC
  Administered 2015-06-01: 07:00:00 via INTRAVENOUS

## 2015-06-01 MED ORDER — PHENYLEPHRINE HCL 10 MG/ML IJ SOLN
INTRAMUSCULAR | Status: DC | PRN
  Administered 2015-06-01: 50 ug via INTRAVENOUS
  Administered 2015-06-01: 100 ug via INTRAVENOUS
  Administered 2015-06-01 (×3): 50 ug via INTRAVENOUS
  Administered 2015-06-01: 100 ug via INTRAVENOUS
  Administered 2015-06-01: 50 ug via INTRAVENOUS

## 2015-06-01 MED ORDER — LIDOCAINE HCL (PF) 1 % IJ SOLN
INTRAMUSCULAR | Status: AC
  Filled 2015-06-01: qty 30

## 2015-06-01 MED ORDER — FAMOTIDINE 20 MG PO TABS
20.0000 mg | ORAL_TABLET | Freq: Once | ORAL | Status: AC
  Administered 2015-06-01: 20 mg via ORAL

## 2015-06-01 SURGICAL SUPPLY — 39 items
BAG COUNTER SPONGE EZ (MISCELLANEOUS) ×2 IMPLANT
BANDAGE ELASTIC 4 LF NS (GAUZE/BANDAGES/DRESSINGS) ×3 IMPLANT
BANDAGE STRETCH 3X4.1 STRL (GAUZE/BANDAGES/DRESSINGS) ×3 IMPLANT
BNDG ESMARK 4X12 TAN STRL LF (GAUZE/BANDAGES/DRESSINGS) ×3 IMPLANT
BNDG GAUZE 4.5X4.1 6PLY STRL (MISCELLANEOUS) ×3 IMPLANT
CANISTER SUCT 1200ML W/VALVE (MISCELLANEOUS) ×3 IMPLANT
CLOSURE WOUND 1/4X4 (GAUZE/BANDAGES/DRESSINGS) ×1
COUNTER SPONGE BAG EZ (MISCELLANEOUS) ×1
CUFF TOURN 18 STER (MISCELLANEOUS) ×3 IMPLANT
CUFF TOURN DUAL PL 12 NO SLV (MISCELLANEOUS) IMPLANT
DRAPE FLUOR MINI C-ARM 54X84 (DRAPES) ×3 IMPLANT
DRSG TEGADERM 4X4.75 (GAUZE/BANDAGES/DRESSINGS) ×3 IMPLANT
DURAPREP 26ML APPLICATOR (WOUND CARE) ×3 IMPLANT
ELECT REM PT RETURN 9FT ADLT (ELECTROSURGICAL) ×3
ELECTRODE REM PT RTRN 9FT ADLT (ELECTROSURGICAL) ×1 IMPLANT
GAUZE PETRO XEROFOAM 1X8 (MISCELLANEOUS) ×3 IMPLANT
GAUZE SPONGE 4X4 12PLY STRL (GAUZE/BANDAGES/DRESSINGS) ×3 IMPLANT
GAUZE STRETCH 2X75IN STRL (MISCELLANEOUS) ×3 IMPLANT
GLOVE BIO SURGEON STRL SZ7.5 (GLOVE) ×3 IMPLANT
GLOVE INDICATOR 8.0 STRL GRN (GLOVE) ×3 IMPLANT
GOWN STRL REUS W/ TWL LRG LVL3 (GOWN DISPOSABLE) ×2 IMPLANT
GOWN STRL REUS W/TWL LRG LVL3 (GOWN DISPOSABLE) ×4
KIT RM TURNOVER STRD PROC AR (KITS) ×3 IMPLANT
LABEL OR SOLS (LABEL) ×3 IMPLANT
NDL SAFETY 18GX1.5 (NEEDLE) ×3 IMPLANT
NEEDLE HYPO 25X1 1.5 SAFETY (NEEDLE) ×6 IMPLANT
NS IRRIG 500ML POUR BTL (IV SOLUTION) ×3 IMPLANT
PACK EXTREMITY ARMC (MISCELLANEOUS) ×3 IMPLANT
PENCIL ELECTRO HAND CTR (MISCELLANEOUS) ×3 IMPLANT
SOL PREP PVP 2OZ (MISCELLANEOUS) ×3
SOLUTION PREP PVP 2OZ (MISCELLANEOUS) ×1 IMPLANT
STOCKINETTE M/LG 89821 (MISCELLANEOUS) ×3 IMPLANT
STOCKINETTE STRL 6IN 960660 (GAUZE/BANDAGES/DRESSINGS) ×3 IMPLANT
STRAP SAFETY BODY (MISCELLANEOUS) ×3 IMPLANT
STRIP CLOSURE SKIN 1/4X4 (GAUZE/BANDAGES/DRESSINGS) ×2 IMPLANT
SUT ETHILON 5-0 FS-2 18 BLK (SUTURE) ×3 IMPLANT
SUT VIC AB 4-0 FS2 27 (SUTURE) ×3 IMPLANT
SUT VICRYL AB 3-0 FS1 BRD 27IN (SUTURE) IMPLANT
SYRINGE 10CC LL (SYRINGE) ×6 IMPLANT

## 2015-06-01 NOTE — H&P (View-Only) (Signed)
[name], female   DOB: [birth year], 60 y.o.   MRN: [id number]   Subjective:    [name], female    DOB: 1955/10/01, 60 y.o.   MRN: [id number]  HPI  Patient here for a pre op evaluation.  She is planning to have foot surgery 06/01/15 (Dr Cleda Mccreedy).  Has neuroma.  Increased pain.  Planning for surgery.  Exercises.  No cardiac symptoms with increased activity or exertion.  No sob.  No acid reflux.  No abdominal pain or cramping.  Some minimal allergy issues, but no significant sinus pressure or nasal congestion.  No chest congestion. Bowels stable.  No diarrhea.  No urinary symptoms.     Past Medical History  Diagnosis Date  . Hypertension   . Hypercholesterolemia   . Migraine headache   . Recurrent sinusitis     allergies  . Abnormal liver function tests    Past Surgical History  Procedure Laterality Date  . Breast biopsy Left 2004    x 2   Family History  Problem Relation Age of Onset  . Heart disease Father     died age 59 ([place])  . Heart disease      4 uncles - CABG  . Heart disease Paternal Grandmother     first MI - 40s  . Migraines Mother   . Breast cancer Mother 40  . Colon cancer      great aunt   Social History   Social History  . Marital Status: Married    Spouse Name: N/A  . Number of Children: 2  . Years of Education: N/A   Social History Main Topics  . Smoking status: Never Smoker   . Smokeless tobacco: Never Used  . Alcohol Use: 0.0 oz/week    0 Standard drinks or equivalent per week     Comment: occasional  . Drug Use: No  . Sexual Activity: Not Asked   Other Topics Concern  . None   Social History Narrative    Outpatient Encounter Prescriptions as of 05/14/2015  Medication Sig  . acetaminophen (TYLENOL) 500 MG tablet Take 1,000 mg by mouth every 8 (eight) hours as needed.  Marland Kitchen amLODipine (NORVASC) 10 MG tablet Take 1 tablet (10 mg total) by mouth daily.  . fexofenadine-pseudoephedrine (ALLEGRA-D) 60-120 MG 12 hr  tablet Take 1 tablet by mouth daily as needed.  . fluticasone (FLONASE) 50 MCG/ACT nasal spray Place 2 sprays into the nose daily as needed.   . hydrochlorothiazide (HYDRODIURIL) 25 MG tablet Take 1 tablet (25 mg total) by mouth daily.  Marland Kitchen ibuprofen (ADVIL,MOTRIN) 200 MG tablet Take 600-800 mg by mouth every 8 (eight) hours as needed.  Marland Kitchen lisinopril (PRINIVIL,ZESTRIL) 40 MG tablet Take 1 tablet (40 mg total) by mouth daily.  Marland Kitchen loratadine (CLARITIN) 10 MG tablet Take 20 mg by mouth daily.   . naproxen sodium (ANAPROX) 220 MG tablet Take 220 mg by mouth as needed.  Marland Kitchen PARoxetine (PAXIL) 10 MG tablet Take 1 tablet (10 mg total) by mouth daily.  . [DISCONTINUED] fexofenadine (ALLEGRA) 180 MG tablet Take 180 mg by mouth as needed.   No facility-administered encounter medications on file as of 05/14/2015.    Review of Systems  Constitutional: Negative for fever, appetite change and unexpected weight change.  HENT: Negative for sinus pressure and sore throat.        Some minimal allergy symptoms.  No sinus pressure.  No sore throat.    Respiratory: Negative  for cough, chest tightness and shortness of breath.   Cardiovascular: Negative for chest pain, palpitations and leg swelling.  Gastrointestinal: Negative for nausea, vomiting, abdominal pain and diarrhea.  Genitourinary: Negative for dysuria and difficulty urinating.  Musculoskeletal: Negative for back pain and joint swelling.  Skin: Negative for color change and rash.  Neurological: Negative for dizziness, light-headedness and headaches.  Psychiatric/Behavioral: Negative for dysphoric mood and agitation.       Objective:     Blood pressure rechecked by me:  112/76  Physical Exam  Constitutional: She appears well-developed and well-nourished. No distress.  HENT:  Nose: Nose normal.  Mouth/Throat: Oropharynx is clear and moist.  Neck: Neck supple. No thyromegaly present.  Cardiovascular: Normal rate and regular rhythm.     Pulmonary/Chest: Breath sounds normal. No respiratory distress. She has no wheezes.  Abdominal: Soft. Bowel sounds are normal. There is no tenderness.  Musculoskeletal: She exhibits no edema or tenderness.  Lymphadenopathy:    She has no cervical adenopathy.  Skin: No rash noted. No erythema.  Psychiatric: She has a normal mood and affect. Her behavior is normal.    BP 100/78 mmHg  Pulse 83  Temp(Src) 97.8 F (36.6 C) (Oral)  Resp 18  Ht 5\' 2"  (1.575 m)  Wt 194 lb 6 oz (88.168 kg)  BMI 35.54 kg/m2  SpO2 96% Wt Readings from Last 3 Encounters:  05/14/15 194 lb 6 oz (88.168 kg)  04/30/15 193 lb 2 oz (87.601 kg)  12/11/14 195 lb 4 oz (88.565 kg)     Lab Results  Component Value Date   WBC 7.2 12/11/2014   HGB 13.4 12/11/2014   HCT 40.1 12/11/2014   PLT 230.0 12/11/2014   GLUCOSE 98 04/30/2015   CHOL 197 04/30/2015   TRIG 144.0 04/30/2015   HDL 36.40* 04/30/2015   LDLDIRECT 153.0 05/04/2014   LDLCALC 131* 04/30/2015   ALT 58* 04/30/2015   AST 34 04/30/2015   NA 140 04/30/2015   K 4.0 04/30/2015   CL 104 04/30/2015   CREATININE 0.81 04/30/2015   BUN 16 04/30/2015   CO2 27 04/30/2015   TSH 1.49 08/07/2014   HGBA1C 6.3 04/30/2015   MICROALBUR 3.0* 12/11/2014        Assessment & Plan:   Problem List Items Addressed This Visit    Foot pain    Planning for foot surgery.        Hypertension - Primary    Blood pressure under good control.  Continue same medication regimen.  Follow pressures.  Follow metabolic panel.  Will need close intra op and post op monitoring of her heart rate and blood pressure to avoid extremes.        Pre-op evaluation    Other Visit Diagnoses    Pre-operative clearance        Relevant Orders    EKG 12-Lead (Completed)        Einar Pheasant, MD

## 2015-06-01 NOTE — Anesthesia Postprocedure Evaluation (Signed)
Anesthesia Post Note  Patient: Kathleen Yu  Procedure(s) Performed: Procedure(s) (LRB): EXCISION MORTON'S NEUROMA (Left) OSTECTOMY/ part. excision fracture fragment left 4th toe (Left)  Patient location during evaluation: PACU Anesthesia Type: General Level of consciousness: awake and alert Pain management: pain level controlled Vital Signs Assessment: post-procedure vital signs reviewed and stable Respiratory status: spontaneous breathing Cardiovascular status: blood pressure returned to baseline Postop Assessment: no headache Anesthetic complications: no    Last Vitals:  Filed Vitals:   06/01/15 0926 06/01/15 0937  BP: 114/72 115/70  Pulse: 87 81  Temp: 35.8 C   Resp: 16     Last Pain:  Filed Vitals:   06/01/15 0938  PainSc: 0-No pain                 [name] Jerilynn Mages

## 2015-06-01 NOTE — Op Note (Signed)
Date of operation: 06/01/2015.  Surgeon: Durward Fortes DPM.  Preoperative diagnosis: 1. Morton's neuroma left foot. 2. Nonunion fracture fragment left fourth toe.  Postoperative diagnosis: Same.  Procedures: 1. Excision Morton's neuroma left foot. 2. Excision nonunion fracture fragment left fourth toe.  Anesthesia: Local Mac.  Hemostasis: Pneumatic tourniquet left ankle 250 mmHg.  Estimated blood loss: Minimal, less than 5 cc.  Pathology: Soft tissue neuroma left third interspace.  Implants: None.  Complications: None apparent.  Operative indications: This is a 60 year old female with a two-year history of a painful neuroma in her left foot. She's undergone multiple injections. Also has a history of a fracture off of the base of her proximal phalanx on the left fourth toe. This did displace and developed into a nonunion. Patient elects to have surgery for removal of the neuroma as well as the fracture fragment off of the fourth toe.  Operative procedure: Patient was taken to the operating room and placed on the table in the supine position. Following satisfactory sedation the left foot was anesthetized with 10 cc of 0.5% bupivacaine plain around the third fourth and fifth metatarsals. A pneumatic tourniquet was applied at the level of the left ankle and the foot was prepped and draped in usual sterile fashion. The foot was exsanguinated and the tourniquet inflated to 250 mmHg. Attention was then directed to the dorsal aspect of the left foot where an approximate 3 cm linear incision was made coursing proximal to distal over the third interspace and ending in the web space between the third and fourth toes. The incision was deepened via sharp and blunt dissection down to the level of the intermetatarsal ligament which was incised. A large neuroma was then clearly visible at the base of the wound. The distal arms of the nerve to the third and fourth toes were traced out and then transected.  Soft tissues freed from the proximal aspect of the nerve which was traced back and then transected with tenotomy scissors. The neuroma was removed in toto. Attention was then directed to the dorsal aspect of the metatarsophalangeal joint where a periosteal incision was made over the joint and capsular tissue was reflected off of the head of the first metatarsal and base of the proximal phalanx. Off of the lateral aspect of the base of the proximal phalanx there was a clearly identified nonunion fracture fragment. This was sharply excised using a Beaver blade and the edges were ronguer and rasped smooth. Intraoperative FluoroScan views revealed good removal of the fracture fragment. The wound was then flushed with copious amounts of sterile saline and closed using 4-0 Vicryl running suture for periosteal and capsular closure around the joint followed by deep and superficial subcutaneous closure followed by skin closure all NA continuous running fashion. Tincture of benzoin and Steri-Strips applied to the incision followed by Xeroform and a sterile gauze dressing. An Ace wrap was then applied for compression. Tourniquet was released and blood flow noted to return immediately to the left foot and all digits. Patient tolerated the procedure and anesthesia well and was transported to the PACU with vital signs stable and in good condition.

## 2015-06-01 NOTE — Discharge Instructions (Addendum)
1. Elevate left leg on 2 pillows.  2. Keep the bandage on the left foot clean, dry, and do not remove.  3. Sponge bathe only left lower extremity.  4. Wear surgical shoe whenever walking or standing on the left foot.  5. Take one pain pill, oxycodone, every 4 hours as needed for pain.   AMBULATORY SURGERY  DISCHARGE INSTRUCTIONS   1) The drugs that you were given will stay in your system until tomorrow so for the next 24 hours you should not:  A) Drive an automobile B) Make any legal decisions C) Drink any alcoholic beverage   2) You may resume regular meals tomorrow.  Today it is better to start with liquids and gradually work up to solid foods.  You may eat anything you prefer, but it is better to start with liquids, then soup and crackers, and gradually work up to solid foods.   3) Please notify your doctor immediately if you have any unusual bleeding, trouble breathing, redness and pain at the surgery site, drainage, fever, or pain not relieved by medication.    4) Additional Instructions:  Please contact your physician with any problems or Same Day Surgery at [phone], Monday through Friday 6 am to 4 pm, or [place] at [facility] number at [phone].

## 2015-06-01 NOTE — Interval H&P Note (Signed)
History and Physical Interval Note:  06/01/2015 7:11 AM  Kathleen Yu  has presented today for surgery, with the diagnosis of Mortons neuroma, Fracture fragment  The various methods of treatment have been discussed with the patient and family. After consideration of risks, benefits and other options for treatment, the patient has consented to  Procedure(s) with comments: EXCISION MORTON'S NEUROMA (Left) - Local w/ mac OSTECTOMY/ part. excision fracture fragment left 4th toe (Left) as a surgical intervention .  The patient's history has been reviewed, patient examined, no change in status, stable for surgery.  I have reviewed the patient's chart and labs.  Questions were answered to the patient's satisfaction.     [name] W.

## 2015-06-01 NOTE — Transfer of Care (Signed)
Immediate Anesthesia Transfer of Care Note  Patient: Kathleen Yu  Procedure(s) Performed: Procedure(s) with comments: EXCISION MORTON'S NEUROMA (Left) - Local w/ mac OSTECTOMY/ part. excision fracture fragment left 4th toe (Left)  Patient Location: PACU  Anesthesia Type:General  Level of Consciousness: awake and alert   Airway & Oxygen Therapy: Patient Spontanous Breathing  Post-op Assessment: Report given to RN and Post -op Vital signs reviewed and stable  Post vital signs: Reviewed and stable  Last Vitals:  Filed Vitals:   06/01/15 0612 06/01/15 0845  BP: 114/89 103/77  Pulse: 89 83  Temp: 36.6 C 36.7 C  Resp: 16 15    Complications: No apparent anesthesia complications

## 2015-06-01 NOTE — Anesthesia Procedure Notes (Signed)
Date/Time: 06/01/2015 7:35 AM Performed by: Johnna Acosta Pre-anesthesia Checklist: Patient identified, Emergency Drugs available, Suction available, Patient being monitored and Timeout performed Patient Re-evaluated:Patient Re-evaluated prior to inductionOxygen Delivery Method: Simple face mask

## 2015-06-01 NOTE — Anesthesia Preprocedure Evaluation (Signed)
Anesthesia Evaluation  Patient identified by MRN, date of birth, ID band Patient awake    Reviewed: Allergy & Precautions, NPO status , Patient's Chart, lab work & pertinent test results  Airway Mallampati: III  TM Distance: >3 FB Neck ROM: Full    Dental  (+) Teeth Intact   Pulmonary    Pulmonary exam normal        Cardiovascular Exercise Tolerance: Good hypertension, Pt. on medications Normal cardiovascular exam     Neuro/Psych    GI/Hepatic   Endo/Other    Renal/GU      Musculoskeletal  (+) Arthritis , Osteoarthritis,    Abdominal Normal abdominal exam  (+)   Peds  Hematology NOT anemic. Hb 13.2.   Anesthesia Other Findings   Reproductive/Obstetrics                             Anesthesia Physical Anesthesia Plan  ASA: II  Anesthesia Plan: General   Post-op Pain Management:    Induction: Intravenous  Airway Management Planned: Simple Face Mask, Natural Airway and Nasal Cannula  Additional Equipment:   Intra-op Plan:   Post-operative Plan:   Informed Consent: I have reviewed the patients History and Physical, chart, labs and discussed the procedure including the risks, benefits and alternatives for the proposed anesthesia with the patient or authorized representative who has indicated his/her understanding and acceptance.     Plan Discussed with: CRNA  Anesthesia Plan Comments: (MAC/local?TIVA.)        Anesthesia Quick Evaluation

## 2015-06-04 LAB — SURGICAL PATHOLOGY

## 2015-07-30 ENCOUNTER — Encounter: Payer: Self-pay | Admitting: Internal Medicine

## 2015-07-30 ENCOUNTER — Ambulatory Visit (INDEPENDENT_AMBULATORY_CARE_PROVIDER_SITE_OTHER): Payer: 59 | Admitting: Internal Medicine

## 2015-07-30 VITALS — BP 110/70 | HR 86 | Temp 98.1°F | Resp 18 | Ht 62.0 in | Wt 195.2 lb

## 2015-07-30 DIAGNOSIS — E7800 Pure hypercholesterolemia, unspecified: Secondary | ICD-10-CM | POA: Diagnosis not present

## 2015-07-30 DIAGNOSIS — R7989 Other specified abnormal findings of blood chemistry: Secondary | ICD-10-CM | POA: Diagnosis not present

## 2015-07-30 DIAGNOSIS — R739 Hyperglycemia, unspecified: Secondary | ICD-10-CM | POA: Diagnosis not present

## 2015-07-30 DIAGNOSIS — M79672 Pain in left foot: Secondary | ICD-10-CM

## 2015-07-30 DIAGNOSIS — E669 Obesity, unspecified: Secondary | ICD-10-CM

## 2015-07-30 DIAGNOSIS — R799 Abnormal finding of blood chemistry, unspecified: Secondary | ICD-10-CM | POA: Diagnosis not present

## 2015-07-30 DIAGNOSIS — I10 Essential (primary) hypertension: Secondary | ICD-10-CM

## 2015-07-30 DIAGNOSIS — R945 Abnormal results of liver function studies: Secondary | ICD-10-CM

## 2015-07-30 LAB — HEPATIC FUNCTION PANEL
ALBUMIN: 4.4 g/dL (ref 3.5–5.2)
ALT: 47 U/L — ABNORMAL HIGH (ref 0–35)
AST: 26 U/L (ref 0–37)
Alkaline Phosphatase: 63 U/L (ref 39–117)
BILIRUBIN DIRECT: 0 mg/dL (ref 0.0–0.3)
TOTAL PROTEIN: 7.3 g/dL (ref 6.0–8.3)
Total Bilirubin: 0.3 mg/dL (ref 0.2–1.2)

## 2015-07-30 LAB — LDL CHOLESTEROL, DIRECT: Direct LDL: 112 mg/dL

## 2015-07-30 LAB — TSH: TSH: 1.94 u[IU]/mL (ref 0.35–4.50)

## 2015-07-30 LAB — BASIC METABOLIC PANEL
BUN: 18 mg/dL (ref 6–23)
CHLORIDE: 102 meq/L (ref 96–112)
CO2: 25 mEq/L (ref 19–32)
Calcium: 10.1 mg/dL (ref 8.4–10.5)
Creatinine, Ser: 0.77 mg/dL (ref 0.40–1.20)
GFR: 81.55 mL/min (ref >60.00)
Glucose, Bld: 116 mg/dL — ABNORMAL HIGH (ref 70–99)
POTASSIUM: 3.4 meq/L — AB (ref 3.5–5.1)
SODIUM: 139 meq/L (ref 135–145)

## 2015-07-30 LAB — LIPID PANEL
CHOL/HDL RATIO: 6
Cholesterol: 207 mg/dL — ABNORMAL HIGH (ref 0–200)
HDL: 32.7 mg/dL — AB (ref >39.00)

## 2015-07-30 LAB — HEMOGLOBIN A1C: Hgb A1c MFr Bld: 6.2 % (ref 4.6–6.5)

## 2015-07-30 LAB — HEPATITIS C ANTIBODY: HCV Ab: NEGATIVE

## 2015-07-30 NOTE — Progress Notes (Signed)
[name], female   DOB: 01/17/1956, 60 y.o.   MRN: [id number]   Subjective:    [name], female    DOB: 27-Dec-1955, 60 y.o.   MRN: [id number]  HPI  Patient here for a scheduled follow up.  She states she is doing well.  S/p excision of Morton's neuroma 06/01/15.  Doing well s/p surgery.  She tries to stay active.  Is exercising.  No cardiac symptoms with increased activity or exertion.  No sob.  Does report some acid reflux if eats late.  May occur 1-2x/month.  If she watches what she eats and does not eat late, no symptoms.  No abdominal pain or cramping.  Bowels stable.     Past Medical History  Diagnosis Date  . Hypertension   . Hypercholesterolemia   . Migraine headache   . Recurrent sinusitis     allergies  . Abnormal liver function tests   . Arthritis     RA  . Anemia     H/O   Past Surgical History  Procedure Laterality Date  . Breast biopsy Left 2004    x 2  . Bilateral carpal tunnel release    . Rhinoplasty    . Excision morton's neuroma Left 06/01/2015    Procedure: EXCISION MORTON'S NEUROMA;  Surgeon: Sharlotte Alamo, DPM;  Location: [facility] ORS;  Service: Podiatry;  Laterality: Left;  Local w/ mac  . Ostectomy Left 06/01/2015    Procedure: OSTECTOMY/ part. excision fracture fragment left 4th toe;  Surgeon: Sharlotte Alamo, DPM;  Location: [facility] ORS;  Service: Podiatry;  Laterality: Left;   Family History  Problem Relation Age of Onset  . Heart disease Father     died age 22 (MI)  . Heart disease      4 uncles - CABG  . Heart disease Paternal Grandmother     first MI - 57s  . Migraines Mother   . Breast cancer Mother 76  . Colon cancer      great aunt   Social History   Social History  . Marital Status: Married    Spouse Name: N/A  . Number of Children: 2  . Years of Education: N/A   Social History Main Topics  . Smoking status: Never Smoker   . Smokeless tobacco: Never Used  . Alcohol Use: 0.0 oz/week    0 Standard drinks or  equivalent per week     Comment: occasional  . Drug Use: No  . Sexual Activity: Not Asked   Other Topics Concern  . None   Social History Narrative    Outpatient Encounter Prescriptions as of 07/30/2015  Medication Sig  . acetaminophen (TYLENOL) 500 MG tablet Take 1,000 mg by mouth every 8 (eight) hours as needed.  Marland Kitchen amLODipine (NORVASC) 10 MG tablet Take 1 tablet (10 mg total) by mouth daily. (Patient taking differently: Take 10 mg by mouth every morning. )  . fexofenadine-pseudoephedrine (ALLEGRA-D) 60-120 MG 12 hr tablet Take 1 tablet by mouth daily as needed.  . fluticasone (FLONASE) 50 MCG/ACT nasal spray Place 2 sprays into the nose daily as needed.   . hydrochlorothiazide (HYDRODIURIL) 25 MG tablet Take 1 tablet (25 mg total) by mouth daily.  Marland Kitchen ibuprofen (ADVIL,MOTRIN) 200 MG tablet Take 600-800 mg by mouth every 8 (eight) hours as needed.  Marland Kitchen lisinopril (PRINIVIL,ZESTRIL) 40 MG tablet Take 1 tablet (40 mg total) by mouth daily. (Patient taking differently: Take 40 mg by mouth every morning. )  .  loratadine (CLARITIN) 10 MG tablet Take 20 mg by mouth daily.   . naproxen sodium (ANAPROX) 220 MG tablet Take 220 mg by mouth as needed.  Marland Kitchen oxyCODONE-acetaminophen (ROXICET) 5-325 MG tablet Take 1 tablet by mouth every 4 (four) hours as needed for severe pain.  Marland Kitchen PARoxetine (PAXIL) 10 MG tablet Take 1 tablet (10 mg total) by mouth daily. (Patient taking differently: Take 10 mg by mouth every morning. )   No facility-administered encounter medications on file as of 07/30/2015.    Review of Systems  Constitutional: Negative for appetite change and unexpected weight change.  HENT: Negative for congestion and sinus pressure.   Respiratory: Negative for cough, chest tightness and shortness of breath.   Cardiovascular: Negative for chest pain, palpitations and leg swelling.  Gastrointestinal: Negative for nausea, vomiting, abdominal pain and diarrhea.       Acid reflux as outlined.      Genitourinary: Negative for dysuria and difficulty urinating.  Musculoskeletal: Negative for back pain and joint swelling.  Skin: Negative for color change and rash.  Neurological: Negative for dizziness and headaches.  Psychiatric/Behavioral: Negative for dysphoric mood and agitation.       Objective:    Physical Exam  Constitutional: She appears well-developed and well-nourished. No distress.  HENT:  Nose: Nose normal.  Mouth/Throat: Oropharynx is clear and moist.  Neck: Neck supple. No thyromegaly present.  Cardiovascular: Normal rate and regular rhythm.   Pulmonary/Chest: Breath sounds normal. No respiratory distress. She has no wheezes.  Abdominal: Soft. Bowel sounds are normal. There is no tenderness.  Musculoskeletal: She exhibits no edema or tenderness.  Lymphadenopathy:    She has no cervical adenopathy.  Skin: No rash noted. No erythema.  Psychiatric: She has a normal mood and affect. Her behavior is normal.    BP 110/70 mmHg  Pulse 86  Temp(Src) 98.1 F (36.7 C) (Oral)  Resp 18  Ht '5\' 2"'  (1.575 m)  Wt 195 lb 4 oz (88.565 kg)  BMI 35.70 kg/m2  SpO2 95%  LMP 05/30/2007 Wt Readings from Last 3 Encounters:  07/30/15 195 lb 4 oz (88.565 kg)  05/14/15 194 lb 6 oz (88.168 kg)  04/30/15 193 lb 2 oz (87.601 kg)     Lab Results  Component Value Date   WBC 6.7 05/30/2015   HGB 13.2 05/30/2015   HCT 38.9 05/30/2015   PLT 231 05/30/2015   GLUCOSE 116* 07/30/2015   CHOL 207* 07/30/2015   TRIG * 07/30/2015    486.0 Triglyceride is over 400; calculations on Lipids are invalid.   HDL 32.70* 07/30/2015   LDLDIRECT 112.0 07/30/2015   LDLCALC 131* 04/30/2015   ALT 47* 07/30/2015   AST 26 07/30/2015   NA 139 07/30/2015   K 3.4* 07/30/2015   CL 102 07/30/2015   CREATININE 0.77 07/30/2015   BUN 18 07/30/2015   CO2 25 07/30/2015   TSH 1.94 07/30/2015   HGBA1C 6.2 07/30/2015   MICROALBUR 3.0* 12/11/2014       Assessment & Plan:   Problem List Items  Addressed This Visit    Abnormal liver function tests    Previously worked up by Dr Candace Cruise.  Previous ultrasound revealed fatty liver.  CT with fatty liver.  Diet and exercise.  Recheck liver panel.        Relevant Orders   Hepatic function panel (Completed)   Foot pain    S/p excision of Morton's neuroma.  Doing well.       Hypercholesterolemia  Low cholesterol diet and exercise.  Follow lipid panel.        Relevant Orders   Lipid panel (Completed)   Hyperglycemia    Low carb diet and exercise.  Follow met b and a1c.       Relevant Orders   Hemoglobin A1c (Completed)   Hypertension - Primary    Blood pressure under good control.  Continue same medication regimen.  Follow pressures.  Follow metabolic panel.        Relevant Orders   Basic metabolic panel (Completed)   TSH (Completed)   Obesity (BMI 30-39.9)    Diet and exercise.  Follow.           Einar Pheasant, MD

## 2015-07-30 NOTE — Progress Notes (Signed)
Pre-visit discussion using our clinic review tool. No additional management support is needed unless otherwise documented below in the visit note.  

## 2015-07-31 ENCOUNTER — Encounter: Payer: Self-pay | Admitting: *Deleted

## 2015-07-31 ENCOUNTER — Other Ambulatory Visit: Payer: Self-pay | Admitting: Internal Medicine

## 2015-07-31 DIAGNOSIS — E876 Hypokalemia: Secondary | ICD-10-CM

## 2015-07-31 NOTE — Progress Notes (Signed)
Order placed for f/u potassium.  

## 2015-08-05 ENCOUNTER — Encounter: Payer: Self-pay | Admitting: Internal Medicine

## 2015-08-05 NOTE — Assessment & Plan Note (Signed)
Blood pressure under good control.  Continue same medication regimen.  Follow pressures.  Follow metabolic panel.   

## 2015-08-05 NOTE — Assessment & Plan Note (Signed)
Previously worked up by Dr Candace Cruise.  Previous ultrasound revealed fatty liver.  CT with fatty liver.  Diet and exercise.  Recheck liver panel.

## 2015-08-05 NOTE — Assessment & Plan Note (Signed)
Low carb diet and exercise.  Follow met b and a1c.  

## 2015-08-05 NOTE — Assessment & Plan Note (Signed)
Low cholesterol diet and exercise.  Follow lipid panel.   

## 2015-08-05 NOTE — Assessment & Plan Note (Signed)
S/p excision of Morton's neuroma.  Doing well.

## 2015-08-05 NOTE — Assessment & Plan Note (Signed)
Diet and exercise.  Follow.  

## 2015-08-14 ENCOUNTER — Other Ambulatory Visit: Payer: Self-pay

## 2015-08-15 ENCOUNTER — Other Ambulatory Visit (INDEPENDENT_AMBULATORY_CARE_PROVIDER_SITE_OTHER): Payer: 59

## 2015-08-15 DIAGNOSIS — E876 Hypokalemia: Secondary | ICD-10-CM | POA: Diagnosis not present

## 2015-08-15 LAB — POTASSIUM: POTASSIUM: 3.9 meq/L (ref 3.5–5.1)

## 2015-08-16 ENCOUNTER — Encounter: Payer: Self-pay | Admitting: Internal Medicine

## 2015-08-21 NOTE — Telephone Encounter (Signed)
Unread mychart message mailed to patient 

## 2015-09-20 DIAGNOSIS — H5213 Myopia, bilateral: Secondary | ICD-10-CM | POA: Diagnosis not present

## 2015-11-30 ENCOUNTER — Other Ambulatory Visit (HOSPITAL_COMMUNITY)
Admission: RE | Admit: 2015-11-30 | Discharge: 2015-11-30 | Disposition: A | Discharge status: Home / Self Care | Payer: 59 | Source: Ambulatory Visit | Attending: Internal Medicine | Admitting: Internal Medicine

## 2015-11-30 ENCOUNTER — Ambulatory Visit (INDEPENDENT_AMBULATORY_CARE_PROVIDER_SITE_OTHER): Payer: 59 | Admitting: Internal Medicine

## 2015-11-30 ENCOUNTER — Encounter: Payer: Self-pay | Admitting: Internal Medicine

## 2015-11-30 VITALS — BP 110/80 | HR 86 | Temp 98.2°F | Resp 18 | Ht 62.15 in | Wt 191.0 lb

## 2015-11-30 DIAGNOSIS — R7989 Other specified abnormal findings of blood chemistry: Secondary | ICD-10-CM

## 2015-11-30 DIAGNOSIS — R739 Hyperglycemia, unspecified: Secondary | ICD-10-CM

## 2015-11-30 DIAGNOSIS — R945 Abnormal results of liver function studies: Secondary | ICD-10-CM

## 2015-11-30 DIAGNOSIS — Z0000 Encounter for general adult medical examination without abnormal findings: Secondary | ICD-10-CM | POA: Diagnosis not present

## 2015-11-30 DIAGNOSIS — E7800 Pure hypercholesterolemia, unspecified: Secondary | ICD-10-CM

## 2015-11-30 DIAGNOSIS — R1084 Generalized abdominal pain: Secondary | ICD-10-CM

## 2015-11-30 DIAGNOSIS — I10 Essential (primary) hypertension: Secondary | ICD-10-CM | POA: Diagnosis not present

## 2015-11-30 DIAGNOSIS — Z01419 Encounter for gynecological examination (general) (routine) without abnormal findings: Secondary | ICD-10-CM | POA: Diagnosis not present

## 2015-11-30 DIAGNOSIS — Z124 Encounter for screening for malignant neoplasm of cervix: Secondary | ICD-10-CM

## 2015-11-30 DIAGNOSIS — Z1239 Encounter for other screening for malignant neoplasm of breast: Secondary | ICD-10-CM | POA: Diagnosis not present

## 2015-11-30 DIAGNOSIS — K625 Hemorrhage of anus and rectum: Secondary | ICD-10-CM

## 2015-11-30 DIAGNOSIS — E669 Obesity, unspecified: Secondary | ICD-10-CM

## 2015-11-30 DIAGNOSIS — Z1151 Encounter for screening for human papillomavirus (HPV): Secondary | ICD-10-CM | POA: Insufficient documentation

## 2015-11-30 LAB — HEPATIC FUNCTION PANEL
ALBUMIN: 4.1 g/dL (ref 3.5–5.2)
ALT: 58 U/L — ABNORMAL HIGH (ref 0–35)
AST: 34 U/L (ref 0–37)
Alkaline Phosphatase: 60 U/L (ref 39–117)
BILIRUBIN TOTAL: 0.3 mg/dL (ref 0.2–1.2)
Bilirubin, Direct: 0.1 mg/dL (ref 0.0–0.3)
Total Protein: 6.7 g/dL (ref 6.0–8.3)

## 2015-11-30 LAB — CBC WITH DIFFERENTIAL/PLATELET
BASOS ABS: 0 10*3/uL (ref 0.0–0.1)
Basophils Relative: 0.5 % (ref 0.0–3.0)
EOS PCT: 3.9 % (ref 0.0–5.0)
Eosinophils Absolute: 0.3 10*3/uL (ref 0.0–0.7)
HEMATOCRIT: 38.8 % (ref 36.0–46.0)
Hemoglobin: 13.1 g/dL (ref 12.0–15.0)
LYMPHS PCT: 35.2 % (ref 12.0–46.0)
Lymphs Abs: 2.5 10*3/uL (ref 0.7–4.0)
MCHC: 33.6 g/dL (ref 30.0–36.0)
MCV: 83.6 fl (ref 78.0–100.0)
MONOS PCT: 8.7 % (ref 3.0–12.0)
Monocytes Absolute: 0.6 10*3/uL (ref 0.1–1.0)
Neutro Abs: 3.6 10*3/uL (ref 1.4–7.7)
Neutrophils Relative %: 51.7 % (ref 43.0–77.0)
Platelets: 256 10*3/uL (ref 150.0–400.0)
RBC: 4.64 Mil/uL (ref 3.87–5.11)
RDW: 14.1 % (ref 11.5–15.5)
WBC: 7 10*3/uL (ref 4.0–10.5)

## 2015-11-30 LAB — BASIC METABOLIC PANEL
BUN: 12 mg/dL (ref 6–23)
CHLORIDE: 109 meq/L (ref 96–112)
CO2: 26 mEq/L (ref 19–32)
CREATININE: 0.81 mg/dL (ref 0.40–1.20)
Calcium: 9 mg/dL (ref 8.4–10.5)
GFR: 76.83 mL/min (ref >60.00)
Glucose, Bld: 99 mg/dL (ref 70–99)
POTASSIUM: 3.8 meq/L (ref 3.5–5.1)
SODIUM: 141 meq/L (ref 135–145)

## 2015-11-30 LAB — LIPID PANEL
CHOL/HDL RATIO: 6
Cholesterol: 137 mg/dL (ref 0–200)
HDL: 24 mg/dL — AB (ref >39.00)
LDL CALC: 78 mg/dL (ref 0–99)
NONHDL: 112.6
Triglycerides: 173 mg/dL — ABNORMAL HIGH (ref 0.0–149.0)
VLDL: 34.6 mg/dL (ref 0.0–40.0)

## 2015-11-30 LAB — HEMOGLOBIN A1C: HEMOGLOBIN A1C: 6.2 % (ref 4.6–6.5)

## 2015-11-30 MED ORDER — HYDROCHLOROTHIAZIDE 25 MG PO TABS: 25.0000 mg | ORAL_TABLET | Freq: Every day | ORAL | 1 refills | Status: DC

## 2015-11-30 MED ORDER — PAROXETINE HCL 10 MG PO TABS: 10.0000 mg | ORAL_TABLET | ORAL | 1 refills | Status: DC

## 2015-11-30 NOTE — Assessment & Plan Note (Signed)
Low carb diet and exercise.  Follow met b and a1c.  

## 2015-11-30 NOTE — Assessment & Plan Note (Signed)
Physical today 11/30/15.  PAP 11/30/15.  Colonoscopy 03/2006.  Due 03/2016.

## 2015-11-30 NOTE — Assessment & Plan Note (Signed)
Low cholesterol diet and exercise.  Follow lipid panel.   

## 2015-11-30 NOTE — Progress Notes (Signed)
[name], female   DOB: [birth year], 60 y.o.   MRN: [id number]   Subjective:    [name], female    DOB: December 26, 1955, 60 y.o.   MRN: [id number]  HPI  Patient with past history of hypercholesterolemia, abnormal liver function tests and hypertension.  She comes in today to follow up on these issues as well as for a complete physical exam.  She reports that starting last Thursday, she developed increased diarrhea.  Noticed blood in her stool.  Had been to a cook out.  The diarrhea resolved within the first 24 hours.  Still had persistent abdominal discomfort and bloating.  Felt better by Saturday.  The abdominal discomfort started again Sunday.  No diarrhea.  No further bleeding.  Still with mild abdominal discomfort.  Much improved.  Bowels normal.  Had formed bowel movement yesterday and today.  Eating.  No nausea or vomiting.  No chest pain.  No sob.  Handling stress.     Past Medical History:  Diagnosis Date  . Abnormal liver function tests   . Allergy   . Anemia    H/O  . Arthritis    RA  . Depression   . Hypercholesterolemia   . Hypertension   . Migraine headache   . Neuromuscular disorder ([place])   . Recurrent sinusitis    allergies   Past Surgical History:  Procedure Laterality Date  . BILATERAL CARPAL TUNNEL RELEASE    . BREAST BIOPSY Left 2004   x 2  . 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 FRACTURE SURGERY  2017  . OSTECTOMY Left 06/01/2015   Procedure: OSTECTOMY/ part. excision fracture fragment left 4th toe;  Surgeon: Sharlotte Alamo, DPM;  Location: [facility] OR[facility];  Service: Podiatry;  Laterality: Left;  . RHINOPLASTY    . TUBAL LIGATION  1997   Family History  Problem Relation Age of Onset  . Heart disease Father     died age 60 (MI)  . Heart disease      4 uncles - CABG  . Colon cancer      great  aunt  . Heart disease Paternal Grandmother     first MI - 26s  . Migraines Mother   . Breast cancer Mother 62  . Hypertension Mother   . Hypertension Maternal Grandmother   . Early death Maternal Grandmother   . Diabetes Paternal Grandfather   . Stroke Paternal Grandfather   . Depression Maternal Aunt   . Breast cancer Maternal Aunt   . Breast cancer Paternal Aunt     2 aunts   Social History   Social History  . Marital status: Married    Spouse name: N/A  . Number of children: 2  . Years of education: N/A   Social History Main Topics  . Smoking status: Never Smoker  . Smokeless tobacco: Never Used  . Alcohol use 0.6 oz/week    1 Cans of beer per week     Comment: occasional  . Drug use: No  . Sexual activity: Yes    Birth control/ protection: Post-menopausal, Surgical   Other Topics Concern  . None   Social History Narrative  . None    Outpatient Encounter Prescriptions as of 11/30/2015  Medication Sig  . acetaminophen (TYLENOL) 500 MG tablet Take 1,000 mg by mouth every  8 (eight) hours as needed.  . amLODipine (NORVASC) 10 MG tablet Take 1 tablet (10 mg total) by mouth daily. (Patient taking differently: Take 10 mg by mouth every morning. )  . fexofenadine-pseudoephedrine (ALLEGRA-D) 60-120 MG 12 hr tablet Take 1 tablet by mouth daily as needed.  . fluticasone (FLONASE) 50 MCG/ACT nasal spray Place 2 sprays into the nose daily as needed.   . hydrochlorothiazide (HYDRODIURIL) 25 MG tablet Take 1 tablet (25 mg total) by mouth daily.  . ibuprofen (ADVIL,MOTRIN) 200 MG tablet Take 600-800 mg by mouth every 8 (eight) hours as needed.  . lisinopril (PRINIVIL,ZESTRIL) 40 MG tablet Take 1 tablet (40 mg total) by mouth daily. (Patient taking differently: Take 40 mg by mouth every morning. )  . loratadine (CLARITIN) 10 MG tablet Take 20 mg by mouth daily.   . naproxen sodium (ANAPROX) 220 MG tablet Take 220 mg by mouth as needed.  . oxyCODONE-acetaminophen (ROXICET) 5-325 MG  tablet Take 1 tablet by mouth every 4 (four) hours as needed for severe pain.  . PARoxetine (PAXIL) 10 MG tablet Take 1 tablet (10 mg total) by mouth every morning.  . [DISCONTINUED] hydrochlorothiazide (HYDRODIURIL) 25 MG tablet Take 1 tablet (25 mg total) by mouth daily.  . [DISCONTINUED] PARoxetine (PAXIL) 10 MG tablet Take 1 tablet (10 mg total) by mouth daily. (Patient taking differently: Take 10 mg by mouth every morning. )   No facility-administered encounter medications on file as of 11/30/2015.     Review of Systems  Constitutional: Negative for appetite change and unexpected weight change.  HENT: Negative for congestion and sinus pressure.   Eyes: Negative for pain and visual disturbance.  Respiratory: Negative for cough, chest tightness and shortness of breath.   Cardiovascular: Negative for chest pain, palpitations and leg swelling.  Gastrointestinal: Positive for abdominal pain and diarrhea. Negative for nausea and vomiting.       Previous diarrhea.  Noticed blood in her stool with initial episode.  Abdominal pain has improved.  Minimal soreness now - upper abdomen.    Genitourinary: Negative for difficulty urinating and dysuria.  Musculoskeletal: Negative for back pain and joint swelling.  Skin: Negative for color change and rash.  Neurological: Negative for dizziness, light-headedness and headaches.  Hematological: Negative for adenopathy. Does not bruise/bleed easily.  Psychiatric/Behavioral: Negative for agitation and dysphoric mood.       Objective:     Blood pressure rechecked by me:  108/72  Physical Exam  Constitutional: She is oriented to person, place, and time. She appears well-developed and well-nourished. No distress.  HENT:  Nose: Nose normal.  Mouth/Throat: Oropharynx is clear and moist.  Eyes: Right eye exhibits no discharge. Left eye exhibits no discharge. No scleral icterus.  Neck: Neck supple. No thyromegaly present.  Cardiovascular: Normal rate and  regular rhythm.   Pulmonary/Chest: Breath sounds normal. No accessory muscle usage. No tachypnea. No respiratory distress. She has no decreased breath sounds. She has no wheezes. She has no rhonchi. Right breast exhibits no inverted nipple, no mass, no nipple discharge and no tenderness (no axillary adenopathy). Left breast exhibits no inverted nipple, no mass, no nipple discharge and no tenderness (no axilarry adenopathy).  Abdominal: Soft. Bowel sounds are normal.  Minimal tenderness to palpation - upper abdomen.  No rebound or guarding.    Genitourinary:  Genitourinary Comments: Normal external genitalia.  Vaginal vault without lesions.  Cervix identified.  Pap smear performed.  Could not appreciate any adnexal masses or tenderness.      Musculoskeletal: She exhibits no edema or tenderness.  Lymphadenopathy:    She has no cervical adenopathy.  Neurological: She is alert and oriented to person, place, and time.  Skin: Skin is warm. No rash noted. No erythema.  Psychiatric: She has a normal mood and affect. Her behavior is normal.    BP 110/80   Pulse 86   Temp 98.2 F (36.8 C) (Oral)   Resp 18   Ht 5' 2.15" (1.579 m)   Wt 191 lb (86.6 kg)   LMP 05/30/2007   SpO2 95%   BMI 34.77 kg/m  Wt Readings from Last 3 Encounters:  11/30/15 191 lb (86.6 kg)  07/30/15 195 lb 4 oz (88.6 kg)  05/14/15 194 lb 6 oz (88.2 kg)     Lab Results  Component Value Date   WBC 7.0 11/30/2015   HGB 13.1 11/30/2015   HCT 38.8 11/30/2015   PLT 256.0 11/30/2015   GLUCOSE 99 11/30/2015   CHOL 137 11/30/2015   TRIG 173.0 (H) 11/30/2015   HDL 24.00 (L) 11/30/2015   LDLDIRECT 112.0 07/30/2015   LDLCALC 78 11/30/2015   ALT 58 (H) 11/30/2015   AST 34 11/30/2015   NA 141 11/30/2015   K 3.8 11/30/2015   CL 109 11/30/2015   CREATININE 0.81 11/30/2015   BUN 12 11/30/2015   CO2 26 11/30/2015   TSH 1.94 07/30/2015   HGBA1C 6.2 11/30/2015   MICROALBUR 3.0 (H) 12/11/2014    No results found.       Assessment & Plan:   Problem List Items Addressed This Visit    Abdominal pain    Abdominal pain and bloating as outlined.  Is better.  Bowels back to normal.  No further bleeding.  She is due colonoscopy 03/2016.  She declines to have another colonoscopy.  Discussed ultrasound and/or CT.  She declines.  States since feeling better, she wants to follow.  Will notify me or will be reevaluated if symptoms persist, change or worsen.        Abnormal liver function tests    Previously worked up by GI.  Previous ultrasound - fatty liver.  CT with fatty liver.  Last liver panel improved.  Recheck liver function tests today.        Relevant Orders   Hepatic function panel (Completed)   Health care maintenance    Physical today 11/30/15.  PAP 11/30/15.  Colonoscopy 03/2006.  Due 03/2016.        Hypercholesterolemia    Low cholesterol diet and exercise.  Follow lipid panel.        Relevant Medications   hydrochlorothiazide (HYDRODIURIL) 25 MG tablet   Other Relevant Orders   Lipid panel (Completed)   Hyperglycemia    Low carb diet and exercise.  Follow met b and a1c.       Relevant Orders   Hemoglobin A1c (Completed)   Hypertension    Blood pressure under good control.  Continue same medication regimen.  Follow pressures.  Follow metabolic panel.        Relevant Medications   hydrochlorothiazide (HYDRODIURIL) 25 MG tablet   Other Relevant Orders   CBC with Differential/Platelet (Completed)   Basic metabolic panel (Completed)   Obesity (BMI 30-39.9)    Diet and exercise.  Follow.        Other Visit Diagnoses    Pap smear for cervical cancer screening    -  Primary   Relevant Orders   Cytology - PAP   Breast cancer   screening       Relevant Orders   MM DIGITAL SCREENING BILATERAL   Rectal bleeding       Relevant Orders   Fecal occult blood, imunochemical       Einar Pheasant, MD

## 2015-11-30 NOTE — Assessment & Plan Note (Signed)
Blood pressure under good control.  Continue same medication regimen.  Follow pressures.  Follow metabolic panel.   

## 2015-11-30 NOTE — Progress Notes (Signed)
Pre-visit discussion using our clinic review tool. No additional management support is needed unless otherwise documented below in the visit note.  

## 2015-11-30 NOTE — Assessment & Plan Note (Signed)
Diet and exercise.  Follow.  

## 2015-12-01 ENCOUNTER — Encounter: Payer: Self-pay | Admitting: Internal Medicine

## 2015-12-01 DIAGNOSIS — R109 Unspecified abdominal pain: Secondary | ICD-10-CM | POA: Insufficient documentation

## 2015-12-01 NOTE — Assessment & Plan Note (Signed)
Previously worked up by GI.  Previous ultrasound - fatty liver.  CT with fatty liver.  Last liver panel improved.  Recheck liver function tests today.

## 2015-12-01 NOTE — Assessment & Plan Note (Signed)
Abdominal pain and bloating as outlined.  Is better.  Bowels back to normal.  No further bleeding.  She is due colonoscopy 03/2016.  She declines to have another colonoscopy.  Discussed ultrasound and/or CT.  She declines.  States since feeling better, she wants to follow.  Will notify me or will be reevaluated if symptoms persist, change or worsen.

## 2015-12-04 ENCOUNTER — Encounter: Payer: Self-pay | Admitting: Internal Medicine

## 2015-12-04 LAB — CYTOLOGY - PAP

## 2015-12-10 ENCOUNTER — Other Ambulatory Visit (INDEPENDENT_AMBULATORY_CARE_PROVIDER_SITE_OTHER): Payer: 59

## 2015-12-10 DIAGNOSIS — K625 Hemorrhage of anus and rectum: Secondary | ICD-10-CM

## 2015-12-10 LAB — FECAL OCCULT BLOOD, IMMUNOCHEMICAL: FECAL OCCULT BLD: NEGATIVE

## 2015-12-11 ENCOUNTER — Encounter: Payer: Self-pay | Admitting: Internal Medicine

## 2015-12-14 NOTE — Telephone Encounter (Signed)
Sent letter to patient.

## 2015-12-24 ENCOUNTER — Other Ambulatory Visit: Payer: Self-pay

## 2015-12-24 ENCOUNTER — Encounter: Payer: Self-pay | Admitting: Internal Medicine

## 2015-12-24 ENCOUNTER — Telehealth: Payer: Self-pay

## 2015-12-24 NOTE — Telephone Encounter (Signed)
Screening Colonoscopy Z12.11 [facility] [id number] UMR  Pre cert is not required

## 2015-12-24 NOTE — Telephone Encounter (Signed)
Gastroenterology Pre-Procedure Review  Request Date: 02/11/2016 Requesting Physician:   PATIENT REVIEW QUESTIONS: The patient responded to the following health history questions as indicated:    1. Are you having any GI issues? no 2. Do you have a personal history of Polyps? no 3. Do you have a family history of Colon Cancer or Polyps? no 4. Diabetes Mellitus? no 5. Joint replacements in the past 12 months?no 6. Major health problems in the past 3 months?no 7. Any artificial heart valves, MVP, or defibrillator?no    MEDICATIONS & ALLERGIES:    Patient reports the following regarding taking any anticoagulation/antiplatelet therapy:   Plavix, Coumadin, Eliquis, Xarelto, Lovenox, Pradaxa, Brilinta, or Effient? no Aspirin? no  Patient confirms/reports the following medications:  Current Outpatient Prescriptions  Medication Sig Dispense Refill  . acetaminophen (TYLENOL) 500 MG tablet Take 1,000 mg by mouth every 8 (eight) hours as needed.    Marland Kitchen amLODipine (NORVASC) 10 MG tablet Take 1 tablet (10 mg total) by mouth daily. (Patient taking differently: Take 10 mg by mouth every morning. ) 90 tablet 1  . fexofenadine-pseudoephedrine (ALLEGRA-D) 60-120 MG 12 hr tablet Take 1 tablet by mouth daily as needed.    . fluticasone (FLONASE) 50 MCG/ACT nasal spray Place 2 sprays into the nose daily as needed.     . hydrochlorothiazide (HYDRODIURIL) 25 MG tablet Take 1 tablet (25 mg total) by mouth daily. 90 tablet 1  . ibuprofen (ADVIL,MOTRIN) 200 MG tablet Take 600-800 mg by mouth every 8 (eight) hours as needed.    Marland Kitchen lisinopril (PRINIVIL,ZESTRIL) 40 MG tablet Take 1 tablet (40 mg total) by mouth daily. (Patient taking differently: Take 40 mg by mouth every morning. ) 90 tablet 1  . loratadine (CLARITIN) 10 MG tablet Take 20 mg by mouth daily.     . naproxen sodium (ANAPROX) 220 MG tablet Take 220 mg by mouth as needed.    Marland Kitchen PARoxetine (PAXIL) 10 MG tablet Take 1 tablet (10 mg total) by mouth every  morning. 90 tablet 1   No current facility-administered medications for this visit.     Patient confirms/reports the following allergies:  No Known Allergies  No orders of the defined types were placed in this encounter.   AUTHORIZATION INFORMATION Primary Insurance: 1D#: Group #:  Secondary Insurance: 1D#: Group #:  SCHEDULE INFORMATION: Date: 02/11/2016 Time: Location: [facility]

## 2016-01-23 ENCOUNTER — Telehealth: Payer: Self-pay | Admitting: Gastroenterology

## 2016-01-23 NOTE — Telephone Encounter (Signed)
[facility] employee pharmacy. They need for you to call in a generic prep.

## 2016-01-24 ENCOUNTER — Other Ambulatory Visit: Payer: Self-pay

## 2016-01-24 MED ORDER — PEG 3350-KCL-NABCB-NACL-NASULF 236 G PO SOLR: 4000.0000 mL | Freq: Once | ORAL | 0 refills | Status: AC

## 2016-01-24 NOTE — Telephone Encounter (Signed)
Rx for Golytely faxed to [facility]. Pt aware.

## 2016-02-05 ENCOUNTER — Encounter: Payer: Self-pay | Admitting: *Deleted

## 2016-02-06 ENCOUNTER — Encounter: Payer: Self-pay | Admitting: Anesthesiology

## 2016-02-08 NOTE — Discharge Instructions (Signed)

## 2016-02-11 ENCOUNTER — Encounter
Admission: RE | Disposition: A | Discharge status: Home / Self Care | Payer: Self-pay | Source: Ambulatory Visit | Attending: Gastroenterology

## 2016-02-11 ENCOUNTER — Ambulatory Visit: Payer: 59 | Admitting: Anesthesiology

## 2016-02-11 ENCOUNTER — Encounter: Payer: Self-pay | Admitting: *Deleted

## 2016-02-11 ENCOUNTER — Ambulatory Visit
Admission: RE | Admit: 2016-02-11 | Discharge: 2016-02-11 | Disposition: A | Discharge status: Home / Self Care | Payer: 59 | Source: Ambulatory Visit | Attending: Gastroenterology | Admitting: Gastroenterology

## 2016-02-11 DIAGNOSIS — Z1211 Encounter for screening for malignant neoplasm of colon: Secondary | ICD-10-CM | POA: Diagnosis not present

## 2016-02-11 DIAGNOSIS — D128 Benign neoplasm of rectum: Secondary | ICD-10-CM | POA: Diagnosis not present

## 2016-02-11 DIAGNOSIS — K641 Second degree hemorrhoids: Secondary | ICD-10-CM | POA: Insufficient documentation

## 2016-02-11 DIAGNOSIS — I10 Essential (primary) hypertension: Secondary | ICD-10-CM | POA: Diagnosis not present

## 2016-02-11 DIAGNOSIS — K621 Rectal polyp: Secondary | ICD-10-CM

## 2016-02-11 DIAGNOSIS — F329 Major depressive disorder, single episode, unspecified: Secondary | ICD-10-CM | POA: Diagnosis not present

## 2016-02-11 DIAGNOSIS — D122 Benign neoplasm of ascending colon: Secondary | ICD-10-CM | POA: Insufficient documentation

## 2016-02-11 DIAGNOSIS — Z79899 Other long term (current) drug therapy: Secondary | ICD-10-CM | POA: Diagnosis not present

## 2016-02-11 DIAGNOSIS — K5730 Diverticulosis of large intestine without perforation or abscess without bleeding: Secondary | ICD-10-CM | POA: Insufficient documentation

## 2016-02-11 HISTORY — PX: COLONOSCOPY WITH PROPOFOL: SHX5780

## 2016-02-11 HISTORY — PX: POLYPECTOMY: SHX5525

## 2016-02-11 SURGERY — COLONOSCOPY WITH PROPOFOL
Anesthesia: Monitor Anesthesia Care | Site: Rectum | Wound class: Contaminated

## 2016-02-11 MED ORDER — LACTATED RINGERS IV SOLN
INTRAVENOUS | Status: DC
  Administered 2016-02-11: 09:00:00 via INTRAVENOUS

## 2016-02-11 MED ORDER — LIDOCAINE HCL (CARDIAC) 20 MG/ML IV SOLN
INTRAVENOUS | Status: DC | PRN
  Administered 2016-02-11: 40 mg via INTRAVENOUS

## 2016-02-11 MED ORDER — PROPOFOL 10 MG/ML IV BOLUS
INTRAVENOUS | Status: DC | PRN
  Administered 2016-02-11: 10 mg via INTRAVENOUS
  Administered 2016-02-11: 20 mg via INTRAVENOUS
  Administered 2016-02-11: 30 mg via INTRAVENOUS
  Administered 2016-02-11: 20 mg via INTRAVENOUS
  Administered 2016-02-11: 60 mg via INTRAVENOUS
  Administered 2016-02-11 (×4): 20 mg via INTRAVENOUS

## 2016-02-11 MED ORDER — STERILE WATER FOR IRRIGATION IR SOLN
Status: DC | PRN
  Administered 2016-02-11: 10:00:00

## 2016-02-11 SURGICAL SUPPLY — 23 items

## 2016-02-11 NOTE — Transfer of Care (Signed)
Immediate Anesthesia Transfer of Care Note  Patient: Kathleen Yu  Procedure(s) Performed: Procedure(s): COLONOSCOPY WITH PROPOFOL (N/A) POLYPECTOMY (N/A)  Patient Location: PACU  Anesthesia Type: MAC  Level of Consciousness: awake, alert  and patient cooperative  Airway and Oxygen Therapy: Patient Spontanous Breathing and Patient connected to supplemental oxygen  Post-op Assessment: Post-op Vital signs reviewed, Patient's Cardiovascular Status Stable, Respiratory Function Stable, Patent Airway and No signs of Nausea or vomiting  Post-op Vital Signs: Reviewed and stable  Complications: No apparent anesthesia complications

## 2016-02-11 NOTE — H&P (Signed)
Kathleen Lame, MD [facility] [address] [place], [place] [address] Phone: [phone] Fax : [phone]  Primary Care Physician:  Einar Pheasant, MD Primary Gastroenterologist:  Dr. Allen Norris  Pre-Procedure History & Physical: HPI:  Kathleen Yu is a 60 y.o. female is here for a screening colonoscopy.   Past Medical History:  Diagnosis Date  . Abnormal liver function tests   . Allergy   . Anemia    H/O  . Arthritis    RA  . Depression   . Hypercholesterolemia   . Hypertension   . Migraine headache    better since menopause  . Neuromuscular disorder ([place])   . Recurrent sinusitis    allergies    Past Surgical History:  Procedure Laterality Date  . BILATERAL CARPAL TUNNEL RELEASE    . BREAST BIOPSY Left 2004   x 2  . 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 FRACTURE SURGERY  2017  . OSTECTOMY Left 06/01/2015   Procedure: OSTECTOMY/ part. excision fracture fragment left 4th toe;  Surgeon: Sharlotte Alamo, DPM;  Location: [facility];  Service: Podiatry;  Laterality: Left;  . RHINOPLASTY    . TUBAL LIGATION  1997    Prior to Admission medications   Medication Sig Start Date End Date Taking? Authorizing Provider  amLODipine (NORVASC) 10 MG tablet Take 1 tablet (10 mg total) by mouth daily. Patient taking differently: Take 10 mg by mouth every morning.  04/30/15  Yes Einar Pheasant, MD  hydrochlorothiazide (HYDRODIURIL) 25 MG tablet Take 1 tablet (25 mg total) by mouth daily. 11/30/15  Yes Einar Pheasant, MD  lisinopril (PRINIVIL,ZESTRIL) 40 MG tablet Take 1 tablet (40 mg total) by mouth daily. Patient taking differently: Take 40 mg by mouth every morning.  04/30/15  Yes Einar Pheasant, MD  loratadine (CLARITIN) 10 MG tablet Take 20 mg by mouth daily.    Yes Historical Provider, MD  PARoxetine (PAXIL) 10 MG tablet Take 1 tablet (10 mg  total) by mouth every morning. 11/30/15  Yes Einar Pheasant, MD  acetaminophen (TYLENOL) 500 MG tablet Take 1,000 mg by mouth every 8 (eight) hours as needed.    Historical Provider, MD  fexofenadine-pseudoephedrine (ALLEGRA-D) 60-120 MG 12 hr tablet Take 1 tablet by mouth daily as needed.    Historical Provider, MD  fluticasone (FLONASE) 50 MCG/ACT nasal spray Place 2 sprays into the nose daily as needed.     Historical Provider, MD  ibuprofen (ADVIL,MOTRIN) 200 MG tablet Take 600-800 mg by mouth every 8 (eight) hours as needed.    Historical Provider, MD  naproxen sodium (ANAPROX) 220 MG tablet Take 220 mg by mouth as needed.    Historical Provider, MD    Allergies as of 12/24/2015  . (No Known Allergies)    Family History  Problem Relation Age of Onset  . Heart disease Father     died age 39 (MI)  . Heart disease      4 uncles - CABG  . Colon cancer      great aunt  . Heart disease Paternal Grandmother     first MI - 59s  . Migraines Mother   . Breast cancer Mother 9  . Hypertension Mother   . Hypertension Maternal Grandmother   . Early death Maternal Grandmother   . Diabetes Paternal Grandfather   . Stroke Paternal Grandfather   .  Depression Maternal Aunt   . Breast cancer Maternal Aunt   . Breast cancer Paternal Aunt     2 aunts    Social History   Social History  . Marital status: Married    Spouse name: N/A  . Number of children: 2  . Years of education: N/A   Occupational History  . Not on file.   Social History Main Topics  . Smoking status: Never Smoker  . Smokeless tobacco: Never Used  . Alcohol use 0.6 oz/week    1 Cans of beer per week     Comment: occasional  . Drug use: No  . Sexual activity: Yes    Birth control/ protection: Post-menopausal, Surgical   Other Topics Concern  . Not on file   Social History Narrative  . No narrative on file    Review of Systems: See HPI, otherwise negative ROS  Physical Exam: BP 128/78   Pulse 78   Temp  98.4 F (36.9 C) (Temporal)   Resp 18   Ht 5\' 2"  (1.575 m)   Wt 191 lb (86.6 kg)   LMP 05/30/2007   SpO2 98%   BMI 34.93 kg/m  General:   Alert,  pleasant and cooperative in NAD Head:  Normocephalic and atraumatic. Neck:  Supple; no masses or thyromegaly. Lungs:  Clear throughout to auscultation.    Heart:  Regular rate and rhythm. Abdomen:  Soft, nontender and nondistended. Normal bowel sounds, without guarding, and without rebound.   Neurologic:  Alert and  oriented x4;  grossly normal neurologically.  Impression/Plan: [name] is now here to undergo a screening colonoscopy.  Risks, benefits, and alternatives regarding colonoscopy have been reviewed with the patient.  Questions have been answered.  All parties agreeable.

## 2016-02-11 NOTE — Anesthesia Postprocedure Evaluation (Signed)
Anesthesia Post Note  Patient: Kathleen Yu  Procedure(s) Performed: Procedure(s) (LRB): COLONOSCOPY WITH PROPOFOL (N/A) POLYPECTOMY (N/A)  Patient location during evaluation: PACU Anesthesia Type: MAC Level of consciousness: awake and alert Pain management: pain level controlled Vital Signs Assessment: post-procedure vital signs reviewed and stable Respiratory status: spontaneous breathing, nonlabored ventilation, respiratory function stable and patient connected to nasal cannula oxygen Cardiovascular status: stable and blood pressure returned to baseline Anesthetic complications: no    Alisa Graff

## 2016-02-11 NOTE — Anesthesia Procedure Notes (Signed)
Procedure Name: MAC Performed by: Chevon Laufer Pre-anesthesia Checklist: Patient identified, Emergency Drugs available, Suction available, Timeout performed and Patient being monitored Patient Re-evaluated:Patient Re-evaluated prior to inductionOxygen Delivery Method: Nasal cannula Placement Confirmation: positive ETCO2       

## 2016-02-11 NOTE — Op Note (Signed)
[facility] Patient Name: Kathleen Yu Procedure Date: 02/11/2016 9:47 AM MRN: [id number] Account #: [id number] Date of Birth: 1955/05/10 Admit Type: Outpatient Age: 60 Room: [facility] OR ROOM 01 Gender: Female Note Status: Finalized Procedure:            Colonoscopy Indications:          Screening for colorectal malignant neoplasm Providers:            Lucilla Lame MD, MD Referring MD:         Einar Pheasant, MD (Referring MD) Medicines:            Propofol per Anesthesia Complications:        No immediate complications. Procedure:            Pre-Anesthesia Assessment:                       - Prior to the procedure, a History and Physical was                        performed, and patient medications and allergies were                        reviewed. The patient's tolerance of previous                        anesthesia was also reviewed. The risks and benefits of                        the procedure and the sedation options and risks were                        discussed with the patient. All questions were                        answered, and informed consent was obtained. Prior                        Anticoagulants: The patient has taken no previous                        anticoagulant or antiplatelet agents. ASA Grade                        Assessment: II - A patient with mild systemic disease.                        After reviewing the risks and benefits, the patient was                        deemed in satisfactory condition to undergo the                        procedure.                       After obtaining informed consent, the colonoscope was                        passed under direct vision. Throughout the procedure,  the patient's blood pressure, pulse, and oxygen                        saturations were monitored continuously. The Olympus                        CF-HQ190L Colonoscope (S#. [id number]) was introduced                    through the anus and advanced to the the cecum,                        identified by appendiceal orifice and ileocecal valve.                        The colonoscopy was performed without difficulty. The                        patient tolerated the procedure well. The quality of                        the bowel preparation was good. Findings:      The perianal and digital rectal examinations were normal.      A 3 mm polyp was found in the ascending colon. The polyp was sessile.       The polyp was removed with a cold biopsy forceps. Resection and       retrieval were complete.      A 3 mm polyp was found in the rectum. The polyp was sessile. The polyp       was removed with a cold biopsy forceps. Resection and retrieval were       complete.      A few small-mouthed diverticula were found in the sigmoid colon.      Non-bleeding internal hemorrhoids were found during retroflexion. The       hemorrhoids were Grade II (internal hemorrhoids that prolapse but reduce       spontaneously). Impression:           - One 3 mm polyp in the ascending colon, removed with a                        cold biopsy forceps. Resected and retrieved.                       - One 3 mm polyp in the rectum, removed with a cold                        biopsy forceps. Resected and retrieved.                       - Diverticulosis in the sigmoid colon.                       - Non-bleeding internal hemorrhoids. Recommendation:       - Discharge patient to home.                       - Resume previous diet.                       - Continue present medications.                       -  Await pathology results.                       - Repeat colonoscopy in 5 years if polyp adenoma and 10                        years if hyperplastic Procedure Code(s):    --- Professional ---                       [phone], Colonoscopy, flexible; with biopsy, single or                        multiple Diagnosis Code(s):    ---  Professional ---                       Z12.11, Encounter for screening for malignant neoplasm                        of colon                       D12.2, Benign neoplasm of ascending colon                       K62.1, Rectal polyp CPT copyright 2016 American Medical Association. All rights reserved. The codes documented in this report are preliminary and upon coder review may  be revised to meet current compliance requirements. Lucilla Lame MD, MD 02/11/2016 10:14:05 AM This report has been signed electronically. Number of Addenda: 0 Note Initiated On: 02/11/2016 9:47 AM Scope Withdrawal Time: 0 hours 8 minutes 4 seconds  Total Procedure Duration: 0 hours 13 minutes 0 seconds       [facility]

## 2016-02-11 NOTE — Anesthesia Preprocedure Evaluation (Signed)
Anesthesia Evaluation  Patient identified by MRN, date of birth, ID band Patient awake    Reviewed: Allergy & Precautions, H&P , NPO status , Patient's Chart, lab work & pertinent test results, reviewed documented beta blocker date and time   Airway Mallampati: II  TM Distance: >3 FB Neck ROM: full    Dental no notable dental hx.    Pulmonary neg pulmonary ROS,    Pulmonary exam normal breath sounds clear to auscultation       Cardiovascular Exercise Tolerance: Good hypertension,  Rhythm:regular Rate:Normal     Neuro/Psych negative neurological ROS  negative psych ROS   GI/Hepatic negative GI ROS, Neg liver ROS,   Endo/Other  negative endocrine ROS  Renal/GU negative Renal ROS  negative genitourinary   Musculoskeletal  (+) Arthritis , Rheumatoid disorders,    Abdominal   Peds  Hematology negative hematology ROS (+) anemia ,   Anesthesia Other Findings   Reproductive/Obstetrics negative OB ROS                             Anesthesia Physical Anesthesia Plan  ASA: II  Anesthesia Plan: MAC   Post-op Pain Management:    Induction:   Airway Management Planned:   Additional Equipment:   Intra-op Plan:   Post-operative Plan:   Informed Consent: I have reviewed the patients History and Physical, chart, labs and discussed the procedure including the risks, benefits and alternatives for the proposed anesthesia with the patient or authorized representative who has indicated his/her understanding and acceptance.   Dental Advisory Given  Plan Discussed with: CRNA  Anesthesia Plan Comments:         Anesthesia Quick Evaluation

## 2016-02-13 ENCOUNTER — Encounter: Payer: Self-pay | Admitting: Internal Medicine

## 2016-02-13 ENCOUNTER — Encounter: Payer: Self-pay | Admitting: Gastroenterology

## 2016-02-13 DIAGNOSIS — Z86010 Personal history of colonic polyps: Secondary | ICD-10-CM | POA: Insufficient documentation

## 2016-02-13 LAB — SURGICAL PATHOLOGY

## 2016-02-15 ENCOUNTER — Other Ambulatory Visit: Payer: Self-pay | Admitting: Internal Medicine

## 2016-02-17 ENCOUNTER — Encounter: Payer: Self-pay | Admitting: Gastroenterology

## 2016-04-01 ENCOUNTER — Other Ambulatory Visit: Payer: Self-pay | Admitting: Internal Medicine

## 2016-04-01 ENCOUNTER — Ambulatory Visit
Admission: RE | Admit: 2016-04-01 | Discharge: 2016-04-01 | Disposition: A | Discharge status: Home / Self Care | Payer: 59 | Source: Ambulatory Visit | Attending: Internal Medicine | Admitting: Internal Medicine

## 2016-04-01 DIAGNOSIS — Z1231 Encounter for screening mammogram for malignant neoplasm of breast: Secondary | ICD-10-CM | POA: Diagnosis not present

## 2016-04-01 DIAGNOSIS — Z1239 Encounter for other screening for malignant neoplasm of breast: Secondary | ICD-10-CM

## 2016-04-07 ENCOUNTER — Encounter: Payer: Self-pay | Admitting: Internal Medicine

## 2016-04-07 ENCOUNTER — Ambulatory Visit (INDEPENDENT_AMBULATORY_CARE_PROVIDER_SITE_OTHER): Payer: 59 | Admitting: Internal Medicine

## 2016-04-07 VITALS — BP 108/66 | HR 92 | Temp 98.4°F | Ht 62.15 in | Wt 191.6 lb

## 2016-04-07 DIAGNOSIS — R7989 Other specified abnormal findings of blood chemistry: Secondary | ICD-10-CM

## 2016-04-07 DIAGNOSIS — I10 Essential (primary) hypertension: Secondary | ICD-10-CM

## 2016-04-07 DIAGNOSIS — R739 Hyperglycemia, unspecified: Secondary | ICD-10-CM | POA: Diagnosis not present

## 2016-04-07 DIAGNOSIS — E7800 Pure hypercholesterolemia, unspecified: Secondary | ICD-10-CM

## 2016-04-07 DIAGNOSIS — E669 Obesity, unspecified: Secondary | ICD-10-CM | POA: Diagnosis not present

## 2016-04-07 DIAGNOSIS — Z86010 Personal history of colonic polyps: Secondary | ICD-10-CM

## 2016-04-07 DIAGNOSIS — R945 Abnormal results of liver function studies: Secondary | ICD-10-CM

## 2016-04-07 MED ORDER — LISINOPRIL 40 MG PO TABS: 40.0000 mg | ORAL_TABLET | Freq: Every day | ORAL | 1 refills | Status: DC

## 2016-04-07 NOTE — Progress Notes (Signed)
Pre visit review using our clinic review tool, if applicable. No additional management support is needed unless otherwise documented below in the visit note. 

## 2016-04-07 NOTE — Progress Notes (Signed)
[name], female   DOB: 1955-04-15, 61 y.o.   MRN: [id number]   Subjective:    [name], female    DOB: 21-Feb-1956, 61 y.o.   MRN: [id number]  HPI  Patient here for a scheduled follow up. She states she is doing well.  Feels good.  States she is trying to watch her diet.  No chest pain.  Tries to stay active.  No sob.  No abdominal pain or cramping.  No bowel change.  No blood in her stool.  Had colonoscopy.  Two polyps removed.  Recommended f/u in five years.     Past Medical History:  Diagnosis Date  . Abnormal liver function tests   . Allergy   . Anemia    H/O  . Arthritis    RA  . Depression   . Hypercholesterolemia   . Hypertension   . Migraine headache    better since menopause  . Neuromuscular disorder ([place])   . Recurrent sinusitis    allergies   Past Surgical History:  Procedure Laterality Date  . BILATERAL CARPAL TUNNEL RELEASE    . BREAST BIOPSY Left 2004   x 2  . COLONOSCOPY WITH PROPOFOL N/A 02/11/2016   Procedure: COLONOSCOPY WITH PROPOFOL;  Surgeon: Lucilla Lame, MD;  Location: [place];  Service: Endoscopy;  Laterality: N/A;  . 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 FRACTURE SURGERY  2017  . OSTECTOMY Left 06/01/2015   Procedure: OSTECTOMY/ part. excision fracture fragment left 4th toe;  Surgeon: Sharlotte Alamo, DPM;  Location: [facility];  Service: Podiatry;  Laterality: Left;  . POLYPECTOMY N/A 02/11/2016   Procedure: POLYPECTOMY;  Surgeon: Lucilla Lame, MD;  Location: [place];  Service: Endoscopy;  Laterality: N/A;  . RHINOPLASTY    . TUBAL LIGATION  1997   Family History  Problem Relation Age of Onset  . Heart disease Father     died age 13 (MI)  . Heart disease      4 uncles - CABG  . Colon cancer      great aunt  . Heart disease Paternal Grandmother    first MI - 3s  . Migraines Mother   . Breast cancer Mother 80  . Hypertension Mother   . Hypertension Maternal Grandmother   . Early death Maternal Grandmother   . Diabetes Paternal Grandfather   . Stroke Paternal Grandfather   . Depression Maternal Aunt   . Breast cancer Maternal Aunt   . Breast cancer Paternal Aunt     2 aunts   Social History   Social History  . Marital status: Married    Spouse name: N/A  . Number of children: 2  . Years of education: N/A   Social History Main Topics  . Smoking status: Never Smoker  . Smokeless tobacco: Never Used  . Alcohol use 0.6 oz/week    1 Cans of beer per week     Comment: occasional  . Drug use: No  . Sexual activity: Yes    Birth control/ protection: Post-menopausal, Surgical   Other Topics Concern  . None   Social History Narrative  . None    Outpatient Encounter Prescriptions as of 04/07/2016  Medication Sig  . acetaminophen (TYLENOL) 500 MG tablet Take 1,000 mg by mouth every 8 (eight) hours as needed.  Marland Kitchen  amLODipine (NORVASC) 10 MG tablet TAKE 1 TABLET BY MOUTH DAILY.  . fexofenadine-pseudoephedrine (ALLEGRA-D) 60-120 MG 12 hr tablet Take 1 tablet by mouth daily as needed.  . fluticasone (FLONASE) 50 MCG/ACT nasal spray Place 2 sprays into the nose daily as needed.   . hydrochlorothiazide (HYDRODIURIL) 25 MG tablet Take 1 tablet (25 mg total) by mouth daily.  Marland Kitchen ibuprofen (ADVIL,MOTRIN) 200 MG tablet Take 600-800 mg by mouth every 8 (eight) hours as needed.  Marland Kitchen lisinopril (PRINIVIL,ZESTRIL) 40 MG tablet Take 1 tablet (40 mg total) by mouth daily.  Marland Kitchen loratadine (CLARITIN) 10 MG tablet Take 20 mg by mouth daily.   . naproxen sodium (ANAPROX) 220 MG tablet Take 220 mg by mouth as needed.  Marland Kitchen PARoxetine (PAXIL) 10 MG tablet Take 1 tablet (10 mg total) by mouth every morning.  . [DISCONTINUED] lisinopril (PRINIVIL,ZESTRIL) 40 MG tablet Take 1 tablet (40 mg total) by mouth daily. (Patient taking differently: Take 40 mg by mouth  every morning. )   No facility-administered encounter medications on file as of 04/07/2016.     Review of Systems  Constitutional: Negative for appetite change and unexpected weight change.  HENT: Negative for congestion and sinus pressure.   Respiratory: Negative for cough, chest tightness and shortness of breath.   Cardiovascular: Negative for chest pain, palpitations and leg swelling.  Gastrointestinal: Negative for abdominal pain, diarrhea, nausea and vomiting.  Genitourinary: Negative for difficulty urinating and dysuria.  Musculoskeletal: Negative for back pain and joint swelling.  Skin: Negative for color change and rash.       Dry skin.   Neurological: Negative for dizziness, light-headedness and headaches.  Psychiatric/Behavioral: Negative for agitation and dysphoric mood.       Objective:     Blood pressure rechecked by me:  110/72  Physical Exam  Constitutional: She appears well-developed and well-nourished. No distress.  HENT:  Nose: Nose normal.  Mouth/Throat: Oropharynx is clear and moist.  Neck: Neck supple. No thyromegaly present.  Cardiovascular: Normal rate and regular rhythm.   Pulmonary/Chest: Breath sounds normal. No respiratory distress. She has no wheezes.  Abdominal: Soft. Bowel sounds are normal. There is no tenderness.  Musculoskeletal: She exhibits no edema or tenderness.  Lymphadenopathy:    She has no cervical adenopathy.  Skin: No rash noted. No erythema.  Psychiatric: She has a normal mood and affect. Her behavior is normal.    BP 108/66   Pulse 92   Temp 98.4 F (36.9 C) (Oral)   Ht 5' 2.15" (1.579 m)   Wt 191 lb 9.6 oz (86.9 kg)   LMP 05/30/2007   SpO2 96%   BMI 34.88 kg/m  Wt Readings from Last 3 Encounters:  04/07/16 191 lb 9.6 oz (86.9 kg)  02/11/16 191 lb (86.6 kg)  11/30/15 191 lb (86.6 kg)     Lab Results  Component Value Date   WBC 7.0 11/30/2015   HGB 13.1 11/30/2015   HCT 38.8 11/30/2015   PLT 256.0 11/30/2015    GLUCOSE 99 11/30/2015   CHOL 137 11/30/2015   TRIG 173.0 (H) 11/30/2015   HDL 24.00 (L) 11/30/2015   LDLDIRECT 112.0 07/30/2015   LDLCALC 78 11/30/2015   ALT 58 (H) 11/30/2015   AST 34 11/30/2015   NA 141 11/30/2015   K 3.8 11/30/2015   CL 109 11/30/2015   CREATININE 0.81 11/30/2015   BUN 12 11/30/2015   CO2 26 11/30/2015   TSH 1.94 07/30/2015   HGBA1C 6.2 11/30/2015   MICROALBUR 3.0 (  H) 12/11/2014    Mm Screening Breast Tomo Bilateral  Result Date: 04/01/2016 CLINICAL DATA:  Screening. EXAM: 2D DIGITAL SCREENING BILATERAL MAMMOGRAM WITH CAD AND ADJUNCT TOMO COMPARISON:  Previous exam(s). ACR Breast Density Category c: The breast tissue is heterogeneously dense, which may obscure small masses. FINDINGS: There are no findings suspicious for malignancy. Images were processed with CAD. IMPRESSION: No mammographic evidence of malignancy. A result letter of this screening mammogram will be mailed directly to the patient. RECOMMENDATION: Screening mammogram in one year. (Code:SM-B-01Y) BI-RADS CATEGORY  1: Negative. Electronically Signed   By: Pamelia Hoit M.D.   On: 04/01/2016 18:33       Assessment & Plan:   Problem List Items Addressed This Visit    Abnormal liver function tests    Previously worked up by GI.  Previous ultrasound - fatty liver. Follow liver function tests.        History of colonic polyps    Just had colonoscopy 02/11/16 as outlined.  Recommended f/u in five years.        Hypercholesterolemia - Primary    Low cholesterol diet and exercise.  Follow lipid panel.        Relevant Medications   lisinopril (PRINIVIL,ZESTRIL) 40 MG tablet   Other Relevant Orders   Hepatic function panel   Lipid panel   Hyperglycemia    Low carb diet and exercise.  Follow met b and a1c.        Relevant Orders   Hemoglobin A1c   Hypertension    Blood pressure under good control.  Continue same medication regimen.  Follow pressures.  Follow metabolic panel.        Relevant  Medications   lisinopril (PRINIVIL,ZESTRIL) 40 MG tablet   Other Relevant Orders   Basic metabolic panel   Obesity (BMI 30-39.9)    Diet and exercise.  Follow.           Einar Pheasant, MD

## 2016-04-08 ENCOUNTER — Encounter: Payer: Self-pay | Admitting: Internal Medicine

## 2016-04-08 NOTE — Assessment & Plan Note (Signed)
Previously worked up by GI.  Previous ultrasound - fatty liver.  Follow liver function tests.   

## 2016-04-08 NOTE — Assessment & Plan Note (Signed)
Just had colonoscopy 02/11/16 as outlined.  Recommended f/u in five years.

## 2016-04-08 NOTE — Assessment & Plan Note (Signed)
Low cholesterol diet and exercise.  Follow lipid panel.   

## 2016-04-08 NOTE — Assessment & Plan Note (Signed)
Low carb diet and exercise.  Follow met b and a1c.   

## 2016-04-08 NOTE — Assessment & Plan Note (Signed)
Blood pressure under good control.  Continue same medication regimen.  Follow pressures.  Follow metabolic panel.   

## 2016-04-08 NOTE — Assessment & Plan Note (Signed)
Diet and exercise.  Follow.  

## 2016-04-09 ENCOUNTER — Other Ambulatory Visit (INDEPENDENT_AMBULATORY_CARE_PROVIDER_SITE_OTHER): Payer: 59

## 2016-04-09 DIAGNOSIS — I10 Essential (primary) hypertension: Secondary | ICD-10-CM

## 2016-04-09 DIAGNOSIS — R739 Hyperglycemia, unspecified: Secondary | ICD-10-CM

## 2016-04-09 DIAGNOSIS — E7800 Pure hypercholesterolemia, unspecified: Secondary | ICD-10-CM

## 2016-04-09 LAB — BASIC METABOLIC PANEL
BUN: 23 mg/dL (ref 6–23)
CO2: 25 mEq/L (ref 19–32)
CREATININE: 0.79 mg/dL (ref 0.40–1.20)
Calcium: 9.7 mg/dL (ref 8.4–10.5)
Chloride: 108 mEq/L (ref 96–112)
GFR: 78.98 mL/min (ref >60.00)
Glucose, Bld: 109 mg/dL — ABNORMAL HIGH (ref 70–99)
Potassium: 4.2 mEq/L (ref 3.5–5.1)
Sodium: 142 mEq/L (ref 135–145)

## 2016-04-09 LAB — LIPID PANEL
CHOL/HDL RATIO: 6
CHOLESTEROL: 152 mg/dL (ref 0–200)
HDL: 27.1 mg/dL — ABNORMAL LOW (ref >39.00)
NONHDL: 125.04
TRIGLYCERIDES: 224 mg/dL — AB (ref 0.0–149.0)
VLDL: 44.8 mg/dL — AB (ref 0.0–40.0)

## 2016-04-09 LAB — HEPATIC FUNCTION PANEL
ALT: 58 U/L — AB (ref 0–35)
AST: 30 U/L (ref 0–37)
Albumin: 4.2 g/dL (ref 3.5–5.2)
Alkaline Phosphatase: 68 U/L (ref 39–117)
BILIRUBIN DIRECT: 0.1 mg/dL (ref 0.0–0.3)
BILIRUBIN TOTAL: 0.4 mg/dL (ref 0.2–1.2)
Total Protein: 6.7 g/dL (ref 6.0–8.3)

## 2016-04-09 LAB — LDL CHOLESTEROL, DIRECT: LDL DIRECT: 97 mg/dL

## 2016-04-09 LAB — HEMOGLOBIN A1C: HEMOGLOBIN A1C: 6.2 % (ref 4.6–6.5)

## 2016-04-10 ENCOUNTER — Encounter: Payer: Self-pay | Admitting: Internal Medicine

## 2016-08-04 ENCOUNTER — Other Ambulatory Visit: Payer: Self-pay | Admitting: Internal Medicine

## 2016-08-11 ENCOUNTER — Ambulatory Visit (INDEPENDENT_AMBULATORY_CARE_PROVIDER_SITE_OTHER): Payer: 59 | Admitting: Internal Medicine

## 2016-08-11 ENCOUNTER — Encounter: Payer: Self-pay | Admitting: Internal Medicine

## 2016-08-11 DIAGNOSIS — E7800 Pure hypercholesterolemia, unspecified: Secondary | ICD-10-CM

## 2016-08-11 DIAGNOSIS — R945 Abnormal results of liver function studies: Secondary | ICD-10-CM

## 2016-08-11 DIAGNOSIS — R739 Hyperglycemia, unspecified: Secondary | ICD-10-CM | POA: Diagnosis not present

## 2016-08-11 DIAGNOSIS — R7989 Other specified abnormal findings of blood chemistry: Secondary | ICD-10-CM

## 2016-08-11 DIAGNOSIS — E669 Obesity, unspecified: Secondary | ICD-10-CM | POA: Diagnosis not present

## 2016-08-11 DIAGNOSIS — I10 Essential (primary) hypertension: Secondary | ICD-10-CM | POA: Diagnosis not present

## 2016-08-11 LAB — HEMOGLOBIN A1C: Hgb A1c MFr Bld: 6.4 % (ref 4.6–6.5)

## 2016-08-11 LAB — BASIC METABOLIC PANEL
BUN: 14 mg/dL (ref 6–23)
CALCIUM: 9.8 mg/dL (ref 8.4–10.5)
CO2: 28 mEq/L (ref 19–32)
CREATININE: 0.82 mg/dL (ref 0.40–1.20)
Chloride: 105 mEq/L (ref 96–112)
GFR: 75.57 mL/min (ref >60.00)
Glucose, Bld: 125 mg/dL — ABNORMAL HIGH (ref 70–99)
Potassium: 3.7 mEq/L (ref 3.5–5.1)
SODIUM: 140 meq/L (ref 135–145)

## 2016-08-11 LAB — HEPATIC FUNCTION PANEL
ALK PHOS: 60 U/L (ref 39–117)
ALT: 37 U/L — ABNORMAL HIGH (ref 0–35)
AST: 24 U/L (ref 0–37)
Albumin: 4.4 g/dL (ref 3.5–5.2)
BILIRUBIN DIRECT: 0.1 mg/dL (ref 0.0–0.3)
Total Bilirubin: 0.5 mg/dL (ref 0.2–1.2)
Total Protein: 7.1 g/dL (ref 6.0–8.3)

## 2016-08-11 LAB — LIPID PANEL
Cholesterol: 200 mg/dL (ref 0–200)
HDL: 36 mg/dL — AB (ref >39.00)
NONHDL: 164.22
TRIGLYCERIDES: 233 mg/dL — AB (ref 0.0–149.0)
Total CHOL/HDL Ratio: 6
VLDL: 46.6 mg/dL — ABNORMAL HIGH (ref 0.0–40.0)

## 2016-08-11 LAB — LDL CHOLESTEROL, DIRECT: LDL DIRECT: 123 mg/dL

## 2016-08-11 MED ORDER — HYDROCHLOROTHIAZIDE 25 MG PO TABS: 25.0000 mg | ORAL_TABLET | Freq: Every day | ORAL | 1 refills | Status: DC

## 2016-08-11 MED ORDER — AMLODIPINE BESYLATE 10 MG PO TABS: 10.0000 mg | ORAL_TABLET | Freq: Every day | ORAL | 1 refills | Status: DC

## 2016-08-11 MED ORDER — LISINOPRIL 40 MG PO TABS: 40.0000 mg | ORAL_TABLET | Freq: Every day | ORAL | 1 refills | Status: DC

## 2016-08-11 NOTE — Assessment & Plan Note (Signed)
Discussed elevated triglycerides.  LDL - 90s.  Hold on cholesterol medication.  Low carb diet and exercise.  Recheck lipid panel.

## 2016-08-11 NOTE — Assessment & Plan Note (Signed)
Blood pressure under good control.  Continue same medication regimen.  Follow pressures.  Follow metabolic panel.   

## 2016-08-11 NOTE — Assessment & Plan Note (Signed)
Diet and exercise - discussed.  Follow.  

## 2016-08-11 NOTE — Assessment & Plan Note (Signed)
Previously worked up by GI.  Previous ultrasound - fatty liver.  Recheck liver panel today.   

## 2016-08-11 NOTE — Progress Notes (Signed)
Patient ID: [name], female   DOB: November 19, 1955, 61 y.o.   MRN: [id number]   Subjective:    Patient ID: [name], female    DOB: November 07, 1955, 61 y.o.   MRN: [id number]  HPI  Patient here for a scheduled follow up.  States she is doing well.  Work is going well.  Handling stress.  Exercising.  Doing some aerobic exercise.  No chest pain.  No sob.  No acid reflux.  No abdominal pain.  Bowels moving.  Discussed diet and exercise.  Discussed elevated triglycerides and low carb diet.  No significant allergy or sinus issues.  Just started using flonase and this is helping.     Past Medical History:  Diagnosis Date  . Abnormal liver function tests   . Allergy   . Anemia    H/O  . Arthritis    RA  . Depression   . Hypercholesterolemia   . Hypertension   . Migraine headache    better since menopause  . Neuromuscular disorder ([place])   . Recurrent sinusitis    allergies   Past Surgical History:  Procedure Laterality Date  . BILATERAL CARPAL TUNNEL RELEASE    . BREAST BIOPSY Left 2004   x 2  . COLONOSCOPY WITH PROPOFOL N/A 02/11/2016   Procedure: COLONOSCOPY WITH PROPOFOL;  Surgeon: Lucilla Lame, MD;  Location: [place];  Service: Endoscopy;  Laterality: N/A;  . 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 FRACTURE SURGERY  2017  . OSTECTOMY Left 06/01/2015   Procedure: OSTECTOMY/ part. excision fracture fragment left 4th toe;  Surgeon: Sharlotte Alamo, DPM;  Location: [facility];  Service: Podiatry;  Laterality: Left;  . POLYPECTOMY N/A 02/11/2016   Procedure: POLYPECTOMY;  Surgeon: Lucilla Lame, MD;  Location: [place];  Service: Endoscopy;  Laterality: N/A;  . RHINOPLASTY    . TUBAL LIGATION  1997   Family History  Problem Relation Age of Onset  . Heart disease Father        died age 57 (MI)  . Heart disease Unknown    4 uncles - CABG  . Colon cancer Unknown        great aunt  . Heart disease Paternal Grandmother        first MI - 5s  . Migraines Mother   . Breast cancer Mother 66  . Hypertension Mother   . Hypertension Maternal Grandmother   . Early death Maternal Grandmother   . Diabetes Paternal Grandfather   . Stroke Paternal Grandfather   . Depression Maternal Aunt   . Breast cancer Maternal Aunt   . Breast cancer Paternal Aunt        2 aunts   Social History   Social History  . Marital status: Married    Spouse name: N/A  . Number of children: 2  . Years of education: N/A   Social History Main Topics  . Smoking status: Never Smoker  . Smokeless tobacco: Never Used  . Alcohol use 0.6 oz/week    1 Cans of beer per week     Comment: occasional  . Drug use: No  . Sexual activity: Yes    Birth control/ protection: Post-menopausal, Surgical   Other Topics Concern  . None   Social History Narrative  . None    Outpatient Encounter Prescriptions as of 08/11/2016  Medication Sig  . acetaminophen (TYLENOL) 500 MG tablet Take 1,000 mg by mouth every 8 (eight) hours as needed.  Marland Kitchen amLODipine (NORVASC) 10 MG tablet Take 1 tablet (10 mg total) by mouth daily.  . fexofenadine-pseudoephedrine (ALLEGRA-D) 60-120 MG 12 hr tablet Take 1 tablet by mouth daily as needed.  . fluticasone (FLONASE) 50 MCG/ACT nasal spray Place 2 sprays into the nose daily as needed.   . hydrochlorothiazide (HYDRODIURIL) 25 MG tablet Take 1 tablet (25 mg total) by mouth daily.  Marland Kitchen ibuprofen (ADVIL,MOTRIN) 200 MG tablet Take 600-800 mg by mouth every 8 (eight) hours as needed.  Marland Kitchen lisinopril (PRINIVIL,ZESTRIL) 40 MG tablet Take 1 tablet (40 mg total) by mouth daily.  Marland Kitchen loratadine (CLARITIN) 10 MG tablet Take 20 mg by mouth daily.   . naproxen sodium (ANAPROX) 220 MG tablet Take 220 mg by mouth as needed.  Marland Kitchen PARoxetine (PAXIL) 10 MG tablet TAKE 1 TABLET BY MOUTH EVERY MORNING  . [DISCONTINUED] amLODipine (NORVASC) 10  MG tablet TAKE 1 TABLET BY MOUTH DAILY.  . [DISCONTINUED] hydrochlorothiazide (HYDRODIURIL) 25 MG tablet Take 1 tablet (25 mg total) by mouth daily.  . [DISCONTINUED] lisinopril (PRINIVIL,ZESTRIL) 40 MG tablet Take 1 tablet (40 mg total) by mouth daily.   No facility-administered encounter medications on file as of 08/11/2016.    Review of Systems  Constitutional: Negative for appetite change and unexpected weight change.  HENT: Negative for congestion and sinus pressure.   Respiratory: Negative for cough, chest tightness and shortness of breath.   Cardiovascular: Negative for chest pain, palpitations and leg swelling.  Gastrointestinal: Negative for abdominal pain, diarrhea, nausea and vomiting.  Genitourinary: Negative for difficulty urinating and dysuria.  Musculoskeletal: Negative for back pain and joint swelling.  Skin: Negative for color change and rash.  Neurological: Negative for dizziness, light-headedness and headaches.  Psychiatric/Behavioral: Negative for agitation and dysphoric mood.       Objective:     Blood pressure rechecked by me:  110/72  Physical Exam  Constitutional: She appears well-developed and well-nourished. No distress.  HENT:  Nose: Nose normal.  Mouth/Throat: Oropharynx is clear and moist.  Neck: Neck supple. No thyromegaly present.  Cardiovascular: Normal rate and regular rhythm.   Pulmonary/Chest: Breath sounds normal. No respiratory distress. She has no wheezes.  Abdominal: Soft. Bowel sounds are normal. There is no tenderness.  Musculoskeletal: She exhibits no edema or tenderness.  Lymphadenopathy:    She has no cervical adenopathy.  Skin: No rash noted. No erythema.  Psychiatric: She has a normal mood and affect. Her behavior is normal.    BP 108/62 (BP Location: Left Arm, Patient Position: Sitting, Cuff Size: Large)   Pulse 82   Temp 98.2 F (36.8 C) (Oral)   Resp 12   Ht 5' 2" (1.575 m)   Wt 195 lb 3.2 oz (88.5 kg)   LMP 05/30/2007    SpO2 97%   BMI 35.70 kg/m  Wt Readings from Last 3 Encounters:  08/11/16 195 lb 3.2 oz (88.5 kg)  04/07/16 191 lb 9.6 oz (86.9 kg)  02/11/16 191 lb (86.6 kg)     Lab Results  Component Value Date   WBC 7.0 11/30/2015   HGB 13.1 11/30/2015   HCT 38.8 11/30/2015   PLT 256.0 11/30/2015   GLUCOSE 109 (H) 04/09/2016   CHOL 152 04/09/2016   TRIG 224.0 (H) 04/09/2016   HDL 27.10 (L) 04/09/2016   LDLDIRECT 97.0 04/09/2016   LDLCALC 78 11/30/2015   ALT 58 (H) 04/09/2016  AST 30 04/09/2016   NA 142 04/09/2016   K 4.2 04/09/2016   CL 108 04/09/2016   CREATININE 0.79 04/09/2016   BUN 23 04/09/2016   CO2 25 04/09/2016   TSH 1.94 07/30/2015   HGBA1C 6.2 04/09/2016   MICROALBUR 3.0 (H) 12/11/2014    Mm Screening Breast Tomo Bilateral  Result Date: 04/01/2016 CLINICAL DATA:  Screening. EXAM: 2D DIGITAL SCREENING BILATERAL MAMMOGRAM WITH CAD AND ADJUNCT TOMO COMPARISON:  Previous exam(s). ACR Breast Density Category c: The breast tissue is heterogeneously dense, which may obscure small masses. FINDINGS: There are no findings suspicious for malignancy. Images were processed with CAD. IMPRESSION: No mammographic evidence of malignancy. A result letter of this screening mammogram will be mailed directly to the patient. RECOMMENDATION: Screening mammogram in one year. (Code:SM-B-01Y) BI-RADS CATEGORY  1: Negative. Electronically Signed   By: Pamelia Hoit M.D.   On: 04/01/2016 18:33       Assessment & Plan:   Problem List Items Addressed This Visit    Abnormal liver function tests    Previously worked up by GI.  Previous ultrasound - fatty liver.  Recheck liver panel today.       Relevant Orders   Hepatic function panel   Hypercholesterolemia    Discussed elevated triglycerides.  LDL - 90s.  Hold on cholesterol medication.  Low carb diet and exercise.  Recheck lipid panel.        Relevant Medications   hydrochlorothiazide (HYDRODIURIL) 25 MG tablet   lisinopril (PRINIVIL,ZESTRIL) 40  MG tablet   amLODipine (NORVASC) 10 MG tablet   Other Relevant Orders   Lipid panel   Hyperglycemia    Low carb diet and exercise.  Check met b and a1c.        Relevant Orders   Hemoglobin A1c   Hypertension    Blood pressure under good control.  Continue same medication regimen.  Follow pressures.  Follow metabolic panel.        Relevant Medications   hydrochlorothiazide (HYDRODIURIL) 25 MG tablet   lisinopril (PRINIVIL,ZESTRIL) 40 MG tablet   amLODipine (NORVASC) 10 MG tablet   Other Relevant Orders   Basic metabolic panel   Obesity (BMI 30-39.9)    Diet and exercise discussed.  Follow.            Einar Pheasant, MD

## 2016-08-11 NOTE — Progress Notes (Signed)
Pre-visit discussion using our clinic review tool. No additional management support is needed unless otherwise documented below in the visit note.  

## 2016-08-11 NOTE — Assessment & Plan Note (Signed)
Low carb diet and exercise.  Check met b and a1c.   

## 2016-08-19 ENCOUNTER — Encounter: Payer: Self-pay | Admitting: Internal Medicine

## 2016-12-15 ENCOUNTER — Ambulatory Visit (INDEPENDENT_AMBULATORY_CARE_PROVIDER_SITE_OTHER): Payer: 59 | Admitting: Internal Medicine

## 2016-12-15 ENCOUNTER — Encounter: Payer: Self-pay | Admitting: Internal Medicine

## 2016-12-15 VITALS — BP 104/64 | HR 83 | Temp 98.8°F | Resp 14 | Ht 62.0 in | Wt 196.0 lb

## 2016-12-15 DIAGNOSIS — N6459 Other signs and symptoms in breast: Secondary | ICD-10-CM

## 2016-12-15 DIAGNOSIS — E7800 Pure hypercholesterolemia, unspecified: Secondary | ICD-10-CM

## 2016-12-15 DIAGNOSIS — R739 Hyperglycemia, unspecified: Secondary | ICD-10-CM

## 2016-12-15 DIAGNOSIS — R945 Abnormal results of liver function studies: Secondary | ICD-10-CM | POA: Diagnosis not present

## 2016-12-15 DIAGNOSIS — Z86010 Personal history of colonic polyps: Secondary | ICD-10-CM | POA: Diagnosis not present

## 2016-12-15 DIAGNOSIS — Z0000 Encounter for general adult medical examination without abnormal findings: Secondary | ICD-10-CM

## 2016-12-15 DIAGNOSIS — I10 Essential (primary) hypertension: Secondary | ICD-10-CM

## 2016-12-15 DIAGNOSIS — Z6835 Body mass index (BMI) 35.0-35.9, adult: Secondary | ICD-10-CM

## 2016-12-15 DIAGNOSIS — Z1239 Encounter for other screening for malignant neoplasm of breast: Secondary | ICD-10-CM

## 2016-12-15 DIAGNOSIS — Z1231 Encounter for screening mammogram for malignant neoplasm of breast: Secondary | ICD-10-CM | POA: Diagnosis not present

## 2016-12-15 DIAGNOSIS — M2560 Stiffness of unspecified joint, not elsewhere classified: Secondary | ICD-10-CM | POA: Diagnosis not present

## 2016-12-15 DIAGNOSIS — R7989 Other specified abnormal findings of blood chemistry: Secondary | ICD-10-CM

## 2016-12-15 LAB — BASIC METABOLIC PANEL
BUN: 19 mg/dL (ref 6–23)
CO2: 27 mEq/L (ref 19–32)
CREATININE: 0.86 mg/dL (ref 0.40–1.20)
Calcium: 10.1 mg/dL (ref 8.4–10.5)
Chloride: 102 mEq/L (ref 96–112)
GFR: 71.45 mL/min (ref >60.00)
GLUCOSE: 121 mg/dL — AB (ref 70–99)
Potassium: 4 mEq/L (ref 3.5–5.1)
Sodium: 138 mEq/L (ref 135–145)

## 2016-12-15 LAB — CBC WITH DIFFERENTIAL/PLATELET
BASOS ABS: 0.1 10*3/uL (ref 0.0–0.1)
Basophils Relative: 0.7 % (ref 0.0–3.0)
EOS ABS: 0.2 10*3/uL (ref 0.0–0.7)
Eosinophils Relative: 3 % (ref 0.0–5.0)
HCT: 39.6 % (ref 36.0–46.0)
Hemoglobin: 13.5 g/dL (ref 12.0–15.0)
LYMPHS ABS: 2.3 10*3/uL (ref 0.7–4.0)
Lymphocytes Relative: 30.2 % (ref 12.0–46.0)
MCHC: 34.1 g/dL (ref 30.0–36.0)
MCV: 87.6 fl (ref 78.0–100.0)
Monocytes Absolute: 0.6 10*3/uL (ref 0.1–1.0)
Monocytes Relative: 8.4 % (ref 3.0–12.0)
NEUTROS PCT: 57.7 % (ref 43.0–77.0)
Neutro Abs: 4.5 10*3/uL (ref 1.4–7.7)
PLATELETS: 249 10*3/uL (ref 150.0–400.0)
RBC: 4.52 Mil/uL (ref 3.87–5.11)
RDW: 13.5 % (ref 11.5–15.5)
WBC: 7.7 10*3/uL (ref 4.0–10.5)

## 2016-12-15 LAB — HEPATIC FUNCTION PANEL
ALT: 46 U/L — ABNORMAL HIGH (ref 0–35)
AST: 28 U/L (ref 0–37)
Albumin: 4.5 g/dL (ref 3.5–5.2)
Alkaline Phosphatase: 54 U/L (ref 39–117)
BILIRUBIN DIRECT: 0.1 mg/dL (ref 0.0–0.3)
BILIRUBIN TOTAL: 0.4 mg/dL (ref 0.2–1.2)
TOTAL PROTEIN: 7.4 g/dL (ref 6.0–8.3)

## 2016-12-15 LAB — LIPID PANEL
CHOL/HDL RATIO: 4
Cholesterol: 190 mg/dL (ref 0–200)
HDL: 45.2 mg/dL (ref >39.00)
NONHDL: 144.41
TRIGLYCERIDES: 201 mg/dL — AB (ref 0.0–149.0)
VLDL: 40.2 mg/dL — ABNORMAL HIGH (ref 0.0–40.0)

## 2016-12-15 LAB — TSH: TSH: 2.73 u[IU]/mL (ref 0.35–4.50)

## 2016-12-15 LAB — HEMOGLOBIN A1C: Hgb A1c MFr Bld: 6 % (ref 4.6–6.5)

## 2016-12-15 LAB — LDL CHOLESTEROL, DIRECT: LDL DIRECT: 116 mg/dL

## 2016-12-15 MED ORDER — AMLODIPINE BESYLATE 10 MG PO TABS: 10.0000 mg | ORAL_TABLET | Freq: Every day | ORAL | 1 refills | Status: DC

## 2016-12-15 MED ORDER — LISINOPRIL 40 MG PO TABS: 40.0000 mg | ORAL_TABLET | Freq: Every day | ORAL | 1 refills | Status: DC

## 2016-12-15 MED ORDER — PAROXETINE HCL 10 MG PO TABS: 10.0000 mg | ORAL_TABLET | Freq: Every morning | ORAL | 1 refills | Status: DC

## 2016-12-15 MED ORDER — HYDROCHLOROTHIAZIDE 25 MG PO TABS: 25.0000 mg | ORAL_TABLET | Freq: Every day | ORAL | 1 refills | Status: DC

## 2016-12-15 NOTE — Assessment & Plan Note (Addendum)
Physical today 12/15/16.  PAP 11/30/15 - negative with negative HPV.  Mammogram 04/01/16 - Birads I.  Colonoscopy 01/2016 as outlined.  Recommended f/u in 5 years.

## 2016-12-15 NOTE — Progress Notes (Signed)
[name], female   DOB: 01/17/1956, 61 y.o.   MRN: [id number]   Subjective:    [name], female    DOB: 03/22/1956, 61 y.o.   MRN: [id number]  HPI  Patient here for her physical exam.  She reports she is doing relatively well.  She has had some joint issues - involving hips and knees.  Doing better.  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  Discussed diet and exercise.  Has gained some weight.  Discussed weight loss.  No chest pain.  No sob.  No acid reflux.  No abdominal pain.  Bowels moving.  Had colonoscopy.  States due f/u in 5 years.     Past Medical History:  Diagnosis Date  . Abnormal liver function tests   . Allergy   . Anemia    H/O  . Arthritis    RA  . Depression   . Hypercholesterolemia   . Hypertension   . Migraine headache    better since menopause  . Neuromuscular disorder ([place])   . Recurrent sinusitis    allergies   Past Surgical History:  Procedure Laterality Date  . BILATERAL CARPAL TUNNEL RELEASE    . BREAST BIOPSY Left 2004   x 2  . COLONOSCOPY WITH PROPOFOL N/A 02/11/2016   Procedure: COLONOSCOPY WITH PROPOFOL;  Surgeon: Lucilla Lame, MD;  Location: [place];  Service: Endoscopy;  Laterality: N/A;  . 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 FRACTURE SURGERY  2017  . OSTECTOMY Left 06/01/2015   Procedure: OSTECTOMY/ part. excision fracture fragment left 4th toe;  Surgeon: Sharlotte Alamo, DPM;  Location: [facility];  Service: Podiatry;  Laterality: Left;  . POLYPECTOMY N/A 02/11/2016   Procedure: POLYPECTOMY;  Surgeon: Lucilla Lame, MD;  Location: [place];  Service: Endoscopy;  Laterality: N/A;  . RHINOPLASTY    . TUBAL LIGATION  1997   Family History  Problem Relation Age of Onset  . Heart disease Father        died age 95 (MI)  . Heart disease Unknown        4  uncles - CABG  . Colon cancer Unknown        great aunt  . Heart disease Paternal Grandmother        first MI - 62s  . Migraines Mother   . Breast cancer Mother 67  . Hypertension Mother   . Hypertension Maternal Grandmother   . Early death Maternal Grandmother   . Diabetes Paternal Grandfather   . Stroke Paternal Grandfather   . Depression Maternal Aunt   . Breast cancer Maternal Aunt   . Breast cancer Paternal Aunt        2 aunts   Social History   Social History  . Marital status: Married    Spouse name: N/A  . Number of children: 2  . Years of education: N/A   Social History Main Topics  . Smoking status: Never Smoker  . Smokeless tobacco: Never Used  . Alcohol use 0.6 oz/week    1 Cans of beer per week     Comment: occasional  . Drug use: No  . Sexual activity: Yes    Birth control/ protection: Post-menopausal, Surgical   Other Topics Concern  . None   Social History Narrative  . None  Outpatient Encounter Prescriptions as of 12/15/2016  Medication Sig  . acetaminophen (TYLENOL) 500 MG tablet Take 1,000 mg by mouth every 8 (eight) hours as needed.  Marland Kitchen amLODipine (NORVASC) 10 MG tablet Take 1 tablet (10 mg total) by mouth daily.  . fexofenadine-pseudoephedrine (ALLEGRA-D) 60-120 MG 12 hr tablet Take 1 tablet by mouth daily as needed.  . fluticasone (FLONASE) 50 MCG/ACT nasal spray Place 2 sprays into the nose daily as needed.   . hydrochlorothiazide (HYDRODIURIL) 25 MG tablet Take 1 tablet (25 mg total) by mouth daily.  Marland Kitchen ibuprofen (ADVIL,MOTRIN) 200 MG tablet Take 600-800 mg by mouth every 8 (eight) hours as needed.  Marland Kitchen lisinopril (PRINIVIL,ZESTRIL) 40 MG tablet Take 1 tablet (40 mg total) by mouth daily.  Marland Kitchen loratadine (CLARITIN) 10 MG tablet Take 20 mg by mouth daily.   . naproxen sodium (ANAPROX) 220 MG tablet Take 220 mg by mouth as needed.  Marland Kitchen PARoxetine (PAXIL) 10 MG tablet Take 1 tablet (10 mg total) by mouth every morning.  . [DISCONTINUED] amLODipine  (NORVASC) 10 MG tablet Take 1 tablet (10 mg total) by mouth daily.  . [DISCONTINUED] hydrochlorothiazide (HYDRODIURIL) 25 MG tablet Take 1 tablet (25 mg total) by mouth daily.  . [DISCONTINUED] lisinopril (PRINIVIL,ZESTRIL) 40 MG tablet Take 1 tablet (40 mg total) by mouth daily.  . [DISCONTINUED] PARoxetine (PAXIL) 10 MG tablet TAKE 1 TABLET BY MOUTH EVERY MORNING   No facility-administered encounter medications on file as of 12/15/2016.     Review of Systems  Constitutional: Negative for appetite change and unexpected weight change.  HENT: Negative for congestion and sinus pressure.   Eyes: Negative for pain and visual disturbance.  Respiratory: Negative for cough, chest tightness and shortness of breath.   Cardiovascular: Negative for chest pain, palpitations and leg swelling.  Gastrointestinal: Negative for abdominal pain, diarrhea, nausea and vomiting.  Genitourinary: Negative for difficulty urinating and dysuria.  Musculoskeletal:       Hips and knee pain and stiffness as outlined.    Skin: Negative for color change and rash.  Neurological: Negative for dizziness, light-headedness and headaches.  Hematological: Negative for adenopathy. Does not bruise/bleed easily.  Psychiatric/Behavioral: Negative for agitation and dysphoric mood.       Objective:     Blood pressure rechecked by me:  110/76  Physical Exam  Constitutional: She is oriented to person, place, and time. She appears well-developed and well-nourished. No distress.  HENT:  Nose: Nose normal.  Mouth/Throat: Oropharynx is clear and moist.  Eyes: Right eye exhibits no discharge. Left eye exhibits no discharge. No scleral icterus.  Neck: Neck supple. No thyromegaly present.  Cardiovascular: Normal rate and regular rhythm.   Pulmonary/Chest: Breath sounds normal. No accessory muscle usage. No tachypnea. No respiratory distress. She has no decreased breath sounds. She has no wheezes. She has no rhonchi. Right breast  exhibits no inverted nipple, no mass, no nipple discharge and no tenderness (no axillary adenopathy). Left breast exhibits no inverted nipple, no mass, no nipple discharge and no tenderness (no axilarry adenopathy).  Abdominal: Soft. Bowel sounds are normal. There is no tenderness.  Musculoskeletal: She exhibits no edema or tenderness.  Lymphadenopathy:    She has no cervical adenopathy.  Neurological: She is alert and oriented to person, place, and time.  Skin: Skin is warm. No rash noted. No erythema.  Psychiatric: She has a normal mood and affect. Her behavior is normal.    BP 104/64 (BP Location: Left Arm, Patient Position: Sitting, Cuff Size: Normal)  Pulse 83   Temp 98.8 F (37.1 C) (Oral)   Resp 14   Ht _0  (1.575 m)   Wt 196 lb (88.9 kg)   LMP 05/30/2007   SpO2 96%   BMI 35.85 kg/m  Wt Readings from Last 3 Encounters:  12/15/16 196 lb (88.9 kg)  08/11/16 195 lb 3.2 oz (88.5 kg)  04/07/16 191 lb 9.6 oz (86.9 kg)     Lab Results  Component Value Date   WBC 7.7 12/15/2016   HGB 13.5 12/15/2016   HCT 39.6 12/15/2016   PLT 249.0 12/15/2016   GLUCOSE 121 (H) 12/15/2016   CHOL 190 12/15/2016   TRIG 201.0 (H) 12/15/2016   HDL 45.20 12/15/2016   LDLDIRECT 116.0 12/15/2016   LDLCALC 78 11/30/2015   ALT 46 (H) 12/15/2016   AST 28 12/15/2016   NA 138 12/15/2016   K 4.0 12/15/2016   CL 102 12/15/2016   CREATININE 0.86 12/15/2016   BUN 19 12/15/2016   CO2 27 12/15/2016   TSH 2.73 12/15/2016   HGBA1C 6.0 12/15/2016   MICROALBUR 3.0 (H) 12/11/2014    Mm Screening Breast Tomo Bilateral  Result Date: 04/01/2016 CLINICAL DATA:  Screening. EXAM: 2D DIGITAL SCREENING BILATERAL MAMMOGRAM WITH CAD AND ADJUNCT TOMO COMPARISON:  Previous exam(s). ACR Breast Density Category c: The breast tissue is heterogeneously dense, which may obscure small masses. FINDINGS: There are no findings suspicious for malignancy. Images were processed with CAD. IMPRESSION: No mammographic evidence  of malignancy. A result letter of this screening mammogram will be mailed directly to the patient. RECOMMENDATION: Screening mammogram in one year. (Code:SM-B-01Y) BI-RADS CATEGORY  1: Negative. Electronically Signed   By: Pamelia Hoit M.D.   On: 04/01/2016 18:33       Assessment & Plan:   Problem List Items Addressed This Visit    Abnormal liver function tests    Previously worked up by GI.  Previous ultrasound - fatty liver.  Follow liver panel.        BMI 35.0-35.9,adult    Discussed diet and exercise.  Follow.        Health care maintenance    Physical today 12/15/16.  PAP 11/30/15 - negative with negative HPV.  Mammogram 04/01/16 - Birads I.  Colonoscopy 01/2016 as outlined.  Recommended f/u in 5 years.       History of colonic polyps    Colonoscopy 02/11/16 - recommended f/u in 5 years.        Hypercholesterolemia    Discussed diet and exercise.  Discussed weight loss.  Low carb diet.  Follow lipid panel.        Relevant Medications   amLODipine (NORVASC) 10 MG tablet   hydrochlorothiazide (HYDRODIURIL) 25 MG tablet   lisinopril (PRINIVIL,ZESTRIL) 40 MG tablet   Other Relevant Orders   Hepatic function panel (Completed)   Lipid panel (Completed)   Hyperglycemia    Low carb diet and exercise.  Follow met b and a1c.        Relevant Orders   Hemoglobin A1c (Completed)   Hypertension    Blood pressure under good control.  Continue same medication regimen.  Follow pressures.  Follow metabolic panel.        Relevant Medications   amLODipine (NORVASC) 10 MG tablet   hydrochlorothiazide (HYDRODIURIL) 25 MG tablet   lisinopril (PRINIVIL,ZESTRIL) 40 MG tablet   Other Relevant Orders   CBC with Differential/Platelet (Completed)   TSH (Completed)   Basic metabolic panel (Completed)    Other  Visit Diagnoses    Inversion of nipple    -  Primary   Relevant Orders   MM Digital Diagnostic Bilat   Screening for breast cancer       Relevant Orders   MM Digital Diagnostic Bilat     Joint stiffness       Hips and knees.  Better.  exercising.  follow.        Einar Pheasant, MD

## 2016-12-16 ENCOUNTER — Other Ambulatory Visit: Payer: Self-pay | Admitting: Internal Medicine

## 2016-12-16 ENCOUNTER — Encounter: Payer: Self-pay | Admitting: Internal Medicine

## 2016-12-16 DIAGNOSIS — N6459 Other signs and symptoms in breast: Secondary | ICD-10-CM

## 2016-12-16 DIAGNOSIS — Z1239 Encounter for other screening for malignant neoplasm of breast: Secondary | ICD-10-CM

## 2016-12-16 NOTE — Assessment & Plan Note (Signed)
Discussed diet and exercise.  Discussed weight loss.  Low carb diet.  Follow lipid panel.

## 2016-12-16 NOTE — Assessment & Plan Note (Signed)
Colonoscopy 02/11/16 - recommended f/u in 5 years.

## 2016-12-16 NOTE — Progress Notes (Signed)
Order placed for mammogram and left breast ultrasound.

## 2016-12-16 NOTE — Assessment & Plan Note (Signed)
Discussed diet and exercise.  Follow.  

## 2016-12-16 NOTE — Assessment & Plan Note (Signed)
Low carb diet and exercise.  Follow met b and a1c.   

## 2016-12-16 NOTE — Assessment & Plan Note (Signed)
Previously worked up by GI.  Previous ultrasound - fatty liver.  Follow liver panel.

## 2016-12-16 NOTE — Assessment & Plan Note (Signed)
Blood pressure under good control.  Continue same medication regimen.  Follow pressures.  Follow metabolic panel.   

## 2016-12-17 ENCOUNTER — Telehealth: Payer: Self-pay | Admitting: Internal Medicine

## 2016-12-17 NOTE — Telephone Encounter (Signed)
Left message to return call to our office. See lab results for more documentation.

## 2016-12-17 NOTE — Telephone Encounter (Signed)
Pt called back returning your call. Please advise, thank you!  Call pt @ [phone]

## 2017-04-06 ENCOUNTER — Ambulatory Visit
Admission: RE | Admit: 2017-04-06 | Discharge: 2017-04-06 | Disposition: A | Discharge status: Home / Self Care | Payer: 59 | Source: Ambulatory Visit | Attending: Internal Medicine | Admitting: Internal Medicine

## 2017-04-06 ENCOUNTER — Other Ambulatory Visit: Payer: Self-pay | Admitting: Internal Medicine

## 2017-04-06 DIAGNOSIS — R922 Inconclusive mammogram: Secondary | ICD-10-CM | POA: Diagnosis not present

## 2017-04-06 DIAGNOSIS — N6459 Other signs and symptoms in breast: Secondary | ICD-10-CM

## 2017-04-06 DIAGNOSIS — Z1239 Encounter for other screening for malignant neoplasm of breast: Secondary | ICD-10-CM

## 2017-04-06 DIAGNOSIS — N6489 Other specified disorders of breast: Secondary | ICD-10-CM | POA: Diagnosis not present

## 2017-04-06 DIAGNOSIS — Z1231 Encounter for screening mammogram for malignant neoplasm of breast: Secondary | ICD-10-CM | POA: Insufficient documentation

## 2017-04-07 ENCOUNTER — Telehealth: Payer: Self-pay

## 2017-04-07 ENCOUNTER — Other Ambulatory Visit: Payer: Self-pay | Admitting: Internal Medicine

## 2017-04-07 DIAGNOSIS — R928 Other abnormal and inconclusive findings on diagnostic imaging of breast: Secondary | ICD-10-CM

## 2017-04-07 DIAGNOSIS — N6453 Retraction of nipple: Secondary | ICD-10-CM

## 2017-04-07 NOTE — Telephone Encounter (Signed)
Was told by dr. Nicki Reaper she sent this to you.

## 2017-04-07 NOTE — Telephone Encounter (Signed)
See lab note.  

## 2017-04-07 NOTE — Telephone Encounter (Signed)
Copied from [place]. Topic: Quick Communication - Office Called Patient >> Apr 07, 2017 10:22 AM Corie Chiquito, [place] wrote: Reason for CRM: Patient had a missed call from the office and would like a call back. She can be reached at [phone]

## 2017-04-09 ENCOUNTER — Ambulatory Visit (HOSPITAL_COMMUNITY)
Admission: RE | Admit: 2017-04-09 | Discharge: 2017-04-09 | Disposition: A | Discharge status: Home / Self Care | Payer: 59 | Source: Ambulatory Visit | Attending: Internal Medicine | Admitting: Internal Medicine

## 2017-04-09 DIAGNOSIS — N6453 Retraction of nipple: Secondary | ICD-10-CM | POA: Diagnosis not present

## 2017-04-09 DIAGNOSIS — R928 Other abnormal and inconclusive findings on diagnostic imaging of breast: Secondary | ICD-10-CM | POA: Diagnosis not present

## 2017-04-09 DIAGNOSIS — N6489 Other specified disorders of breast: Secondary | ICD-10-CM | POA: Diagnosis not present

## 2017-04-09 LAB — POCT I-STAT CREATININE: CREATININE: 0.8 mg/dL (ref 0.44–1.00)

## 2017-04-09 MED ORDER — GADOBENATE DIMEGLUMINE 529 MG/ML IV SOLN
20.0000 mL | Freq: Once | INTRAVENOUS | Status: AC | PRN
  Administered 2017-04-09: 20 mL via INTRAVENOUS

## 2017-04-11 ENCOUNTER — Encounter: Payer: Self-pay | Admitting: Internal Medicine

## 2017-04-12 ENCOUNTER — Other Ambulatory Visit: Payer: Self-pay | Admitting: Internal Medicine

## 2017-04-12 NOTE — Progress Notes (Signed)
Opened in error

## 2017-04-14 ENCOUNTER — Other Ambulatory Visit: Payer: Self-pay | Admitting: Internal Medicine

## 2017-04-14 ENCOUNTER — Encounter: Payer: Self-pay | Admitting: Internal Medicine

## 2017-04-14 DIAGNOSIS — N6320 Unspecified lump in the left breast, unspecified quadrant: Secondary | ICD-10-CM

## 2017-04-15 NOTE — Telephone Encounter (Signed)
Called pt and left message on her cell and her home phone.  See other my chart message.

## 2017-04-20 ENCOUNTER — Encounter: Payer: Self-pay | Admitting: Internal Medicine

## 2017-04-20 ENCOUNTER — Ambulatory Visit (INDEPENDENT_AMBULATORY_CARE_PROVIDER_SITE_OTHER): Payer: 59 | Admitting: Internal Medicine

## 2017-04-20 DIAGNOSIS — R739 Hyperglycemia, unspecified: Secondary | ICD-10-CM

## 2017-04-20 DIAGNOSIS — Z6835 Body mass index (BMI) 35.0-35.9, adult: Secondary | ICD-10-CM | POA: Diagnosis not present

## 2017-04-20 DIAGNOSIS — E7800 Pure hypercholesterolemia, unspecified: Secondary | ICD-10-CM | POA: Diagnosis not present

## 2017-04-20 DIAGNOSIS — I10 Essential (primary) hypertension: Secondary | ICD-10-CM

## 2017-04-20 DIAGNOSIS — R7989 Other specified abnormal findings of blood chemistry: Secondary | ICD-10-CM

## 2017-04-20 DIAGNOSIS — R945 Abnormal results of liver function studies: Secondary | ICD-10-CM | POA: Diagnosis not present

## 2017-04-20 LAB — BASIC METABOLIC PANEL
BUN: 18 mg/dL (ref 6–23)
CALCIUM: 9.6 mg/dL (ref 8.4–10.5)
CO2: 29 mEq/L (ref 19–32)
Chloride: 102 mEq/L (ref 96–112)
Creatinine, Ser: 0.85 mg/dL (ref 0.40–1.20)
GFR: 72.33 mL/min (ref >60.00)
Glucose, Bld: 133 mg/dL — ABNORMAL HIGH (ref 70–99)
Potassium: 3.9 mEq/L (ref 3.5–5.1)
SODIUM: 138 meq/L (ref 135–145)

## 2017-04-20 LAB — LIPID PANEL
CHOL/HDL RATIO: 5
CHOLESTEROL: 218 mg/dL — AB (ref 0–200)
HDL: 40.3 mg/dL (ref >39.00)
LDL CALC: 141 mg/dL — AB (ref 0–99)
NonHDL: 177.41
Triglycerides: 182 mg/dL — ABNORMAL HIGH (ref 0.0–149.0)
VLDL: 36.4 mg/dL (ref 0.0–40.0)

## 2017-04-20 LAB — HEPATIC FUNCTION PANEL
ALK PHOS: 51 U/L (ref 39–117)
ALT: 41 U/L — ABNORMAL HIGH (ref 0–35)
AST: 25 U/L (ref 0–37)
Albumin: 4.6 g/dL (ref 3.5–5.2)
BILIRUBIN DIRECT: 0.1 mg/dL (ref 0.0–0.3)
BILIRUBIN TOTAL: 0.6 mg/dL (ref 0.2–1.2)
Total Protein: 7.3 g/dL (ref 6.0–8.3)

## 2017-04-20 LAB — HEMOGLOBIN A1C: HEMOGLOBIN A1C: 6.3 % (ref 4.6–6.5)

## 2017-04-20 MED ORDER — ZOSTER VAC RECOMB ADJUVANTED 50 MCG/0.5ML IM SUSR: 0.5000 mL | Freq: Once | INTRAMUSCULAR | 0 refills | Status: AC

## 2017-04-20 NOTE — Assessment & Plan Note (Signed)
Discussed diet and exercise.  Discussed weight loss.  Low carb diet.  Follow lipid panel.

## 2017-04-20 NOTE — Progress Notes (Signed)
[name], female   DOB: January 18, 1956, 62 y.o.   MRN: [id number]   Subjective:    [name], female    DOB: [birth year], 62 y.o.   MRN: [id number]  HPI  Patient here for a scheduled follow up.  Recently found to have nipple inversion.  See previous note.  Just recently had mammogram.  Revealed multiple dilated ducts within the retroareolar left breast.  Within the 3:00 position there was noted to be material within the dilated duct.  See mammogram report.  MRI recommended.  Had MRI - no suspicious masses seen in either breast.  There was still noted high T1 signal within the ducts suggestive of proteinaceous debris.  Recommended biopsy. Discussed with her today.  Discussed radiology evaluation and surgery referral.  She prefers to have Dr Rosana Hoes (through radiology) evaluate.  Procedure is scheduled, but she prefers to be in [place].  States otherwise she is doing well.  Trying to stay active.  States she is exercising.  Weight is stable.  Discussed diet adjustment.  No chest pain.  No sob.  No acid reflux.  No abdominal pain.  Bowels moving.     Past Medical History:  Diagnosis Date  . Abnormal liver function tests   . Allergy   . Anemia    H/O  . Arthritis    RA  . Depression   . Hypercholesterolemia   . Hypertension   . Migraine headache    better since menopause  . Neuromuscular disorder ([place])   . Recurrent sinusitis    allergies   Past Surgical History:  Procedure Laterality Date  . BILATERAL CARPAL TUNNEL RELEASE    . BREAST BIOPSY Left 2004   x 2  . COLONOSCOPY WITH PROPOFOL N/A 02/11/2016   Procedure: COLONOSCOPY WITH PROPOFOL;  Surgeon: Lucilla Lame, MD;  Location: [facility];  Service: Endoscopy;  Laterality: N/A;  . 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 FRACTURE SURGERY  2017  . OSTECTOMY  Left 06/01/2015   Procedure: OSTECTOMY/ part. excision fracture fragment left 4th toe;  Surgeon: Sharlotte Alamo, DPM;  Location: [facility];  Service: Podiatry;  Laterality: Left;  . POLYPECTOMY N/A 02/11/2016   Procedure: POLYPECTOMY;  Surgeon: Lucilla Lame, MD;  Location: [facility];  Service: Endoscopy;  Laterality: N/A;  . RHINOPLASTY    . TUBAL LIGATION  1997   Family History  Problem Relation Age of Onset  . Heart disease Father        died age 16 (MI)  . Heart disease Unknown        4 uncles - CABG  . Colon cancer Unknown        great aunt  . Heart disease Paternal Grandmother        first MI - 30s  . Migraines Mother   . Breast cancer Mother 31  . Hypertension Mother   . Hypertension Maternal Grandmother   . Early death Maternal Grandmother   . Diabetes Paternal Grandfather   . Stroke Paternal Grandfather   . Depression Maternal Aunt   . Breast cancer Maternal Aunt   . Breast cancer Paternal Aunt        2 aunts   Social History   Socioeconomic History  . Marital status: Married    Spouse name: None  . Number of children:  2  . Years of education: None  . Highest education level: None  Social Needs  . Financial resource strain: None  . Food insecurity - worry: None  . Food insecurity - inability: None  . Transportation needs - medical: None  . Transportation needs - non-medical: None  Occupational History  . None  Tobacco Use  . Smoking status: Never Smoker  . Smokeless tobacco: Never Used  Substance and Sexual Activity  . Alcohol use: Yes    Alcohol/week: 0.6 oz    Types: 1 Cans of beer per week    Comment: occasional  . Drug use: No  . Sexual activity: Yes    Birth control/protection: Post-menopausal, Surgical  Other Topics Concern  . None  Social History Narrative  . None    Outpatient Encounter Medications as of 04/20/2017  Medication Sig  . acetaminophen (TYLENOL) 500 MG tablet Take 1,000 mg by mouth every 8 (eight) hours as needed.  Marland Kitchen  amLODipine (NORVASC) 10 MG tablet Take 1 tablet (10 mg total) by mouth daily.  . fexofenadine-pseudoephedrine (ALLEGRA-D) 60-120 MG 12 hr tablet Take 1 tablet by mouth daily as needed.  . fluticasone (FLONASE) 50 MCG/ACT nasal spray Place 2 sprays into the nose daily as needed.   . hydrochlorothiazide (HYDRODIURIL) 25 MG tablet Take 1 tablet (25 mg total) by mouth daily.  Marland Kitchen ibuprofen (ADVIL,MOTRIN) 200 MG tablet Take 600-800 mg by mouth every 8 (eight) hours as needed.  Marland Kitchen lisinopril (PRINIVIL,ZESTRIL) 40 MG tablet Take 1 tablet (40 mg total) by mouth daily.  Marland Kitchen loratadine (CLARITIN) 10 MG tablet Take 20 mg by mouth daily.   . naproxen sodium (ANAPROX) 220 MG tablet Take 220 mg by mouth as needed.  Marland Kitchen PARoxetine (PAXIL) 10 MG tablet Take 1 tablet (10 mg total) by mouth every morning.  . [EXPIRED] Zoster Vaccine Adjuvanted [facility]) injection Inject 0.5 mLs into the muscle once for 1 dose.   No facility-administered encounter medications on file as of 04/20/2017.     Review of Systems  Constitutional: Negative for appetite change and unexpected weight change.  HENT: Negative for congestion and sinus pressure.   Respiratory: Negative for cough, chest tightness and shortness of breath.   Cardiovascular: Negative for chest pain, palpitations and leg swelling.  Gastrointestinal: Negative for abdominal pain, diarrhea and nausea.  Genitourinary: Negative for difficulty urinating and dysuria.  Musculoskeletal: Negative for back pain and joint swelling.  Skin: Negative for color change and rash.  Neurological: Negative for dizziness, light-headedness and headaches.  Psychiatric/Behavioral: Negative for agitation and dysphoric mood.       Objective:     Blood pressure rechecked by me:  118/78  Physical Exam  Constitutional: She appears well-developed and well-nourished. No distress.  HENT:  Nose: Nose normal.  Mouth/Throat: Oropharynx is clear and moist.  Neck: Neck supple. No thyromegaly  present.  Cardiovascular: Normal rate and regular rhythm.  Pulmonary/Chest: Breath sounds normal. No respiratory distress. She has no wheezes.  Abdominal: Soft. Bowel sounds are normal. There is no tenderness.  Musculoskeletal: She exhibits no edema or tenderness.  Lymphadenopathy:    She has no cervical adenopathy.  Skin: No rash noted. No erythema.  Psychiatric: She has a normal mood and affect. Her behavior is normal.    BP 118/78   Pulse 77   Temp 98.3 F (36.8 C) (Oral)   Resp 16   Wt 196 lb 6.4 oz (89.1 kg)   LMP 05/30/2007   SpO2 98%   BMI 35.92 kg/m  Wt Readings from Last 3 Encounters:  04/20/17 196 lb 6.4 oz (89.1 kg)  12/15/16 196 lb (88.9 kg)  08/11/16 195 lb 3.2 oz (88.5 kg)     Lab Results  Component Value Date   WBC 7.7 12/15/2016   HGB 13.5 12/15/2016   HCT 39.6 12/15/2016   PLT 249.0 12/15/2016   GLUCOSE 133 (H) 04/20/2017   CHOL 218 (H) 04/20/2017   TRIG 182.0 (H) 04/20/2017   HDL 40.30 04/20/2017   LDLDIRECT 116.0 12/15/2016   LDLCALC 141 (H) 04/20/2017   ALT 41 (H) 04/20/2017   AST 25 04/20/2017   NA 138 04/20/2017   K 3.9 04/20/2017   CL 102 04/20/2017   CREATININE 0.85 04/20/2017   BUN 18 04/20/2017   CO2 29 04/20/2017   TSH 2.73 12/15/2016   HGBA1C 6.3 04/20/2017   MICROALBUR 3.0 (H) 12/11/2014    Mr Breast Bilateral W Wo Contrast Inc Cad  Result Date: 04/09/2017 CLINICAL DATA:  Three months of left nipple inversion. No cause seen on recent mammogram or ultrasound. Debris versus a mass in a dilated left retroareolar duct. LABS:  GFR greater than 60 creatinine 0.8 EXAM: BILATERAL BREAST MRI WITH AND WITHOUT CONTRAST TECHNIQUE: Multiplanar, multisequence MR images of both breasts were obtained prior to and following the intravenous administration of 18 ml of MultiHance. THREE-DIMENSIONAL MR IMAGE RENDERING ON INDEPENDENT WORKSTATION: Three-dimensional MR images were rendered by post-processing of the original MR data on an independent  workstation. The three-dimensional MR images were interpreted, and findings are reported in the following complete MRI report for this study. Three dimensional images were evaluated at the independent DynaCad workstation COMPARISON:  Previous exam(s). FINDINGS: Breast composition: c. Heterogeneous fibroglandular tissue. Background parenchymal enhancement: Mild Right breast: No mass or abnormal enhancement. Dilated retroareolar milk ducts containing proteinaceous debris. Left breast: Left nipple inversion is identified. No underlying causes noted. Dilated ducts are seen behind the left nipple with no intraductal masses identified. High T1 signal within the ducts suggest proteinaceous debris. Lymph nodes: Symmetric lymph nodes seen in the axilla. No abnormal adenopathy. Ancillary findings:  None. IMPRESSION: 1. No cause for left nipple inversion identified. No suspicious masses seen in either breast. The intraductal mass versus debris seen in a left-sided retroareolar milk duct on recent mammography is not visualized today. RECOMMENDATION: Recommend biopsy of the possible intraductal mass on the left seen on recent mammogram and ultrasound. Treatment of the patient's nipple inversion should be based on clinical and physical exam given the lack of imaging findings. BI-RADS CATEGORY  2: Benign. Electronically Signed   By: Dorise Bullion III M.D   On: 04/09/2017 15:29       Assessment & Plan:   Problem List Items Addressed This Visit    Abnormal liver function tests    Previously worked up by GI.  Previous ultrasound - fatty liver.  Recheck liver panel today.        Relevant Orders   Hepatic function panel (Completed)   BMI 35.0-35.9,adult    Discussed diet and exercise.  Follow.       Hypercholesterolemia    Discussed diet and exercise.  Discussed weight loss.  Low carb diet.  Follow lipid panel.        Relevant Orders   Lipid panel (Completed)   Hyperglycemia    Low carb diet and exercise.   Follow met b and a1c.        Relevant Orders   Hemoglobin A1c (Completed)   Hypertension  Blood pressure under good control.  Continue same medication regimen.  Follow pressures.  Follow metabolic panel.        Relevant Orders   Basic metabolic panel (Completed)       Einar Pheasant, MD

## 2017-04-20 NOTE — Assessment & Plan Note (Signed)
Blood pressure under good control.  Continue same medication regimen.  Follow pressures.  Follow metabolic panel.   

## 2017-04-20 NOTE — Assessment & Plan Note (Signed)
Low carb diet and exercise.  Follow met b and a1c.   

## 2017-04-20 NOTE — Assessment & Plan Note (Signed)
Previously worked up by GI.  Previous ultrasound - fatty liver.  Recheck liver panel today.

## 2017-04-21 ENCOUNTER — Encounter: Payer: Self-pay | Admitting: Internal Medicine

## 2017-04-22 ENCOUNTER — Encounter: Payer: Self-pay | Admitting: Internal Medicine

## 2017-04-22 NOTE — Assessment & Plan Note (Signed)
Discussed diet and exercise.  Follow.  

## 2017-04-23 ENCOUNTER — Other Ambulatory Visit: Payer: Self-pay

## 2017-05-01 ENCOUNTER — Ambulatory Visit: Payer: 59

## 2017-05-08 ENCOUNTER — Encounter: Payer: Self-pay | Admitting: Internal Medicine

## 2017-05-12 ENCOUNTER — Ambulatory Visit
Admission: RE | Admit: 2017-05-12 | Discharge: 2017-05-12 | Disposition: A | Discharge status: Home / Self Care | Payer: 59 | Source: Ambulatory Visit | Attending: Internal Medicine | Admitting: Internal Medicine

## 2017-05-12 DIAGNOSIS — N6320 Unspecified lump in the left breast, unspecified quadrant: Secondary | ICD-10-CM | POA: Diagnosis present

## 2017-05-12 DIAGNOSIS — D242 Benign neoplasm of left breast: Secondary | ICD-10-CM | POA: Diagnosis not present

## 2017-05-12 DIAGNOSIS — R928 Other abnormal and inconclusive findings on diagnostic imaging of breast: Secondary | ICD-10-CM | POA: Diagnosis not present

## 2017-05-12 DIAGNOSIS — N6012 Diffuse cystic mastopathy of left breast: Secondary | ICD-10-CM | POA: Diagnosis not present

## 2017-05-12 HISTORY — PX: BREAST BIOPSY: SHX20

## 2017-05-13 LAB — SURGICAL PATHOLOGY

## 2017-05-15 ENCOUNTER — Telehealth: Payer: Self-pay | Admitting: Internal Medicine

## 2017-05-15 NOTE — Telephone Encounter (Signed)
Please call pt and see if she has been notified by radiology of results.  If so, did they recommend surgery evaluation.  If so, then which surgeon does she prefer to see.

## 2017-05-15 NOTE — Telephone Encounter (Signed)
FYI. I will call patient. Is there anything else you would like me to tell her as far as the surgical consultation? Just wanted to clarify before I spoke with her.

## 2017-05-15 NOTE — Telephone Encounter (Signed)
Copied from [place] [id number]. Topic: Quick Communication - See Telephone Encounter >> May 15, 2017  8:42 AM Bennye Alm wrote: See Telephone Encounter:  Ermalene Searing with [facility] did the patient's biopsy at [facility] and it turns out that it's not cancer, but they did find something that requires a surgical consultation. She just wanted to make Dr. Nicki Reaper and her assistant aware and asks that they give her a call back when they get a chance. She has attempted to contact patient and lvm to call back, but has not yet spoken to her. Patient is not aware of results as of now.

## 2017-05-15 NOTE — Telephone Encounter (Signed)
Left message for patient to call back  

## 2017-05-18 ENCOUNTER — Encounter: Payer: Self-pay | Admitting: Internal Medicine

## 2017-05-18 NOTE — Telephone Encounter (Signed)
Patient stated that radiology did notify her of results and also told her it was not cancerous. She does not want to be referred to surgery at this time.

## 2017-05-20 NOTE — Telephone Encounter (Signed)
Spoke with Kathleen Yu.  Discussed pt declining referral at this time.  Plans to discuss with Dr Rosana Hoes.  Will addendum with recommendation once he returns.  Pt desires no further w/up at this time.

## 2017-05-26 ENCOUNTER — Telehealth: Payer: Self-pay | Admitting: Internal Medicine

## 2017-05-26 ENCOUNTER — Other Ambulatory Visit: Payer: Self-pay | Admitting: Internal Medicine

## 2017-05-26 DIAGNOSIS — R928 Other abnormal and inconclusive findings on diagnostic imaging of breast: Secondary | ICD-10-CM

## 2017-05-26 DIAGNOSIS — D369 Benign neoplasm, unspecified site: Secondary | ICD-10-CM

## 2017-05-26 NOTE — Telephone Encounter (Signed)
-----   Message from [place], [place] sent at 05/25/2017  3:49 PM EST ----- Regarding: recommendations post biopsy Dr. Nicki Reaper, I spoke to Dr. Rosana Hoes and told him that Ms. Patricelli does not wish to see a Psychologist, sport and exercise at this time.  He said his recommendation is "surgical consultation and if the patient chooses to not see a surgeon at this time, he recommends a minimum 6 month follow-up diagnostic mammogram and ultrasound of the left breast".  We will get the addendum dictated now and you will have that recommendation on the biopsy dictation.  If you have any other questions, please call me at [phone] and I can let you know where Dr. Rosana Hoes may be if you need to talk to him. Thank you. Jetta Lout, [facility]

## 2017-05-26 NOTE — Telephone Encounter (Signed)
Have discussed with pt.  She desires not to be referred at this time - to a Psychologist, sport and exercise.  Desires no further intervention at this time.  Will notify me if she changes her mind.  Will need f/u diagnostic mammogram and ultrasound of left breast.

## 2017-05-26 NOTE — Progress Notes (Signed)
Order placed for referral to Dr Byrnett.   

## 2017-05-27 ENCOUNTER — Encounter: Payer: Self-pay | Admitting: General Surgery

## 2017-06-03 ENCOUNTER — Inpatient Hospital Stay: Payer: Self-pay

## 2017-06-03 ENCOUNTER — Encounter: Payer: Self-pay | Admitting: *Deleted

## 2017-06-03 ENCOUNTER — Ambulatory Visit (INDEPENDENT_AMBULATORY_CARE_PROVIDER_SITE_OTHER): Payer: 59 | Admitting: General Surgery

## 2017-06-03 VITALS — BP 148/90 | HR 98 | Resp 14 | Ht 62.0 in | Wt 195.0 lb

## 2017-06-03 DIAGNOSIS — D242 Benign neoplasm of left breast: Secondary | ICD-10-CM

## 2017-06-03 DIAGNOSIS — N6321 Unspecified lump in the left breast, upper outer quadrant: Secondary | ICD-10-CM

## 2017-06-03 NOTE — Progress Notes (Signed)
[name], female   DOB: 04/03/1955, 62 y.o.   MRN: [id number]  Chief Complaint  Patient presents with  . Other    HPI Kathleen Yu is a 62 y.o. female who presents for a breast evaluation. The most recent mammogram was done on 04/06/2017 and MRI on 04/09/2017 left breast biopsy done on 05/12/2017.  Patient states she has a inverted left nipple for about 10 years now.  Patient reports that about 6 months ago she noted that the left nipple was retracting.  As this progressed she pointed out to her PCP who arrange for multiple diagnostic studies.  There is no history of trauma to the area in the last 2 decades.  No difficulty with nursing 20 years ago.  No past episodes of mastitis.  She is a non-smoker.  The patient has been aware of a small nodule developing adjacent to the edge of the areola since her biopsy.  Minimal tenderness.  Minimal bruising.  Patient does perform regular self breast checks and gets regular mammograms done.    HPI  Past Medical History:  Diagnosis Date  . Abnormal liver function tests   . Allergy   . Anemia    H/O  . Arthritis    RA  . Depression   . Hypercholesterolemia   . Hypertension   . Migraine headache    better since menopause  . Neuromuscular disorder ([place])   . Recurrent sinusitis    allergies    Past Surgical History:  Procedure Laterality Date  . BILATERAL CARPAL TUNNEL RELEASE    . BREAST BIOPSY Left 2004   x 2  . BREAST BIOPSY Left 05/12/2017   FRAGMENTS WITH FEATURES OF AN EARLY INTRADUCTAL PAPILLOMA WITH aprocine changes  . COLONOSCOPY WITH PROPOFOL N/A 02/11/2016   Procedure: COLONOSCOPY WITH PROPOFOL;  Surgeon: Lucilla Lame, MD;  Location: [facility];  Service: Endoscopy;  Laterality: N/A;  . 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 FRACTURE SURGERY  2017  .  OSTECTOMY Left 06/01/2015   Procedure: OSTECTOMY/ part. excision fracture fragment left 4th toe;  Surgeon: Sharlotte Alamo, DPM;  Location: [facility];  Service: Podiatry;  Laterality: Left;  . POLYPECTOMY N/A 02/11/2016   Procedure: POLYPECTOMY;  Surgeon: Lucilla Lame, MD;  Location: [facility];  Service: Endoscopy;  Laterality: N/A;  . RHINOPLASTY    . TUBAL LIGATION  1997    Family History  Problem Relation Age of Onset  . Heart disease Father        died age 79 (MI)  . Heart disease Unknown        4 uncles - CABG  . Colon cancer Unknown        great aunt  . Heart disease Paternal Grandmother        first MI - 55s  . Migraines Mother   . Breast cancer Mother 71  . Hypertension Mother   . Hypertension Maternal Grandmother   . Early death Maternal Grandmother   . Diabetes Paternal Grandfather   . Stroke Paternal Grandfather   . Depression Maternal Aunt   . Breast cancer Maternal Aunt   . Breast cancer Paternal Aunt        2 aunts    Social History Social History   Tobacco Use  . Smoking status: Never Smoker  . Smokeless tobacco:  Never Used  Substance Use Topics  . Alcohol use: Yes    Alcohol/week: 0.6 oz    Types: 1 Cans of beer per week    Comment: occasional  . Drug use: No    No Known Allergies  Current Outpatient Medications  Medication Sig Dispense Refill  . acetaminophen (TYLENOL) 500 MG tablet Take 1,000 mg by mouth daily as needed for moderate pain.     Marland Kitchen amLODipine (NORVASC) 10 MG tablet Take 1 tablet (10 mg total) by mouth daily. 90 tablet 1  . fexofenadine-pseudoephedrine (ALLEGRA-D) 60-120 MG 12 hr tablet Take 1 tablet by mouth daily as needed (allergies).     . fluticasone (FLONASE) 50 MCG/ACT nasal spray Place 2 sprays into the nose daily as needed for allergies.     . hydrochlorothiazide (HYDRODIURIL) 25 MG tablet Take 1 tablet (25 mg total) by mouth daily. 90 tablet 1  . ibuprofen (ADVIL,MOTRIN) 200 MG tablet Take 600-800 mg by mouth daily as  needed (arthritis).     Marland Kitchen lisinopril (PRINIVIL,ZESTRIL) 40 MG tablet Take 1 tablet (40 mg total) by mouth daily. 90 tablet 1  . loratadine (CLARITIN) 10 MG tablet Take 10 mg by mouth daily.     . naproxen sodium (ANAPROX) 220 MG tablet Take 440 mg by mouth daily as needed (pain).     Marland Kitchen PARoxetine (PAXIL) 10 MG tablet Take 1 tablet (10 mg total) by mouth every morning. 90 tablet 1   No current facility-administered medications for this visit.     Review of Systems Review of Systems  Constitutional: Negative.   Respiratory: Negative.   Cardiovascular: Negative.     Blood pressure (!) 148/90, pulse 98, resp. rate 14, height _0  (1.575 m), weight 195 lb (88.5 kg), last menstrual period 05/30/2007.  Physical Exam Physical Exam  Constitutional: She is oriented to person, place, and time. She appears well-developed and well-nourished.  Eyes: Conjunctivae are normal. No scleral icterus.  Neck: Neck supple.  Cardiovascular: Normal rate, regular rhythm and normal heart sounds.  Pulmonary/Chest: Effort normal and breath sounds normal. Right breast exhibits no inverted nipple, no mass, no nipple discharge, no skin change and no tenderness. Left breast exhibits no inverted nipple, no mass, no nipple discharge, no skin change and no tenderness.    Lymphadenopathy:    She has no cervical adenopathy.    She has no axillary adenopathy.  Neurological: She is alert and oriented to person, place, and time.  Skin: Skin is warm and dry.    Data Reviewed Ultrasound examination of the left breast in the upper outer quadrant at the 11 o'clock position, 8.5 cm from the nipple showed a 0.4 x 0.5 x 0.66 cm hypoechoic nodule with posterior acoustic shadowing.  In the periareolar area of the left breast a small hematoma measuring 0.8 cm is identified.  The retroareolar area shows multiple dilated ducts up to 0.6 cm in diameter and the dominant area noted on the [facility] study shows a diameter of 0.47 cm with  significant intraductal debris.  BI-RADS-4.  May 12, 2017 ultrasound-guided biopsy: A. LEFT BREAST, 3:00, RETROAREOLAR; ULTRASOUND-GUIDED BIOPSY:  - PARTIAL PROFILE OF CYST WALL.  - FRAGMENTS WITH FEATURES OF AN EARLY INTRADUCTAL PAPILLOMA WITH  APOCRINE CHANGE.   Left breast MRI dated April 09, 2017, partial report: Left breast: Left nipple inversion is identified. No underlying causes noted. Dilated ducts are seen behind the left nipple with no intraductal masses identified. High T1 signal within the ducts suggest proteinaceous debris.  Bilateral diagnostic mammogram and left breast ultrasound dated April 06, 2017 reviewed.  Assessment    Nipple distortion out of proportion to pathologic finding.  Small focal area of thickening in the upper outer quadrant of the left breast without mammographic or MRI correlate.  Abnormal ultrasound.    Plan  There is marked discordance between the nipple distortion and the pathology result.  The patient denies any history of breast trauma, nipple discharge, drainage or inflammation.  She is a non-smoker.  This is likely chronic inflammatory change, although none was identified on pathology.  In light of the dramatic change in the contour of the left nipple over the last 6 months, I recommended resection of the retroareolar ductal tissue in its entirety for assessment.  At the same time a core biopsy of the palpable area of thickening in the upper inner quadrant of the left breast is been recommended based on the ultrasound finding.    HPI, Physical Exam, Assessment and Plan have been scribed under the direction and in the presence of Hervey Ard, MD.  Gaspar Cola, CMA  I have completed the exam and reviewed the above documentation for accuracy and completeness.  I agree with the above.  Haematologist has been used and any errors in dictation or transcription are unintentional.  Hervey Ard, M.D., F.A.C.S.  Forest Gleason  Byrnett 06/04/2017, 1:48 PM  Patient's surgery has been scheduled for 06-12-17 at [facility].   Dominga Ferry, CMA

## 2017-06-03 NOTE — Patient Instructions (Signed)
Patient to have a breast biopsy and excision

## 2017-06-04 ENCOUNTER — Encounter
Admission: RE | Admit: 2017-06-04 | Discharge: 2017-06-04 | Disposition: A | Discharge status: Home / Self Care | Payer: 59 | Source: Ambulatory Visit | Attending: General Surgery | Admitting: General Surgery

## 2017-06-04 ENCOUNTER — Other Ambulatory Visit: Payer: Self-pay

## 2017-06-04 ENCOUNTER — Other Ambulatory Visit: Payer: Self-pay | Admitting: General Surgery

## 2017-06-04 DIAGNOSIS — N6321 Unspecified lump in the left breast, upper outer quadrant: Secondary | ICD-10-CM | POA: Insufficient documentation

## 2017-06-04 DIAGNOSIS — D242 Benign neoplasm of left breast: Secondary | ICD-10-CM | POA: Insufficient documentation

## 2017-06-04 HISTORY — DX: Fatty (change of) liver, not elsewhere classified: K76.0

## 2017-06-04 HISTORY — DX: Respiratory tuberculosis unspecified: A15.9

## 2017-06-04 NOTE — Patient Instructions (Signed)
Your procedure is scheduled on: 06-12-17 FRIDAY Report to Same Day Surgery 2nd floor medical mall [facility] Entrance-take elevator on left to 2nd floor.  Check in with surgery information desk.) To find out your arrival time please call [phone] between 1PM - 3PM on 06-11-17 THURSDAY  Remember: Instructions that are not followed completely may result in serious medical risk, up to and including death, or upon the discretion of your surgeon and anesthesiologist your surgery may need to be rescheduled.    _x___ 1. Do not eat food after midnight the night before your procedure. NO GUM OR CANDY AFTER MIDNIGHT.  You may drink clear liquids up to 2 hours before you are scheduled to arrive at the hospital for your procedure.  Do not drink clear liquids within 2 hours of your scheduled arrival to the hospital.  Clear liquids include  --Water or Apple juice without pulp  --Clear carbohydrate beverage such as ClearFast or Gatorade  --Black Coffee or Clear Tea (No milk, no creamers, do not add anything to the coffee or Tea     __x__ 2. No Alcohol for 24 hours before or after surgery.   __x__3. No Smoking or e-cigarettes for 24 prior to surgery.  Do not use any chewable tobacco products for at least 6 hour prior to surgery   ____  4. Bring all medications with you on the day of surgery if instructed.    __x__ 5. Notify your doctor if there is any change in your medical condition     (cold, fever, infections).    x___6. On the morning of surgery brush your teeth with toothpaste and water.  You may rinse your mouth with mouth wash if you wish.  Do not swallow any toothpaste or mouthwash.   Do not wear jewelry, make-up, hairpins, clips or nail polish.  Do not wear lotions, powders, or perfumes. You may wear deodorant.  Do not shave 48 hours prior to surgery. Men may shave face and neck.  Do not bring valuables to the hospital.    [facility] is not responsible for any belongings or  valuables.               Contacts, dentures or bridgework may not be worn into surgery.  Leave your suitcase in the car. After surgery it may be brought to your room.  For patients admitted to the hospital, discharge time is determined by your treatment team.  _  Patients discharged the day of surgery will not be allowed to drive home.  You will need someone to drive you home and stay with you the night of your procedure.    Please read over the following fact sheets that you were given:   [facility] Preparing for Surgery and or MRSA Information   _x___ TAKE THE FOLLOWING MEDICATION THE MORNING OF SURGERY WITH A SMALL SIP OF WATER. These include:  1. AMLODIPINE  2. PAXIL  3.  4.  5.  6.  ____Fleets enema or Magnesium Citrate as directed.   _x___ Use CHG Soap or sage wipes as directed on instruction sheet   ____ Use inhalers on the day of surgery and bring to hospital day of surgery  ____ Stop Metformin and Janumet 2 days prior to surgery.    ____ Take 1/2 of usual insulin dose the night before surgery and none on the morning surgery.   ____ Follow recommendations from Cardiologist, Pulmonologist or PCP regarding stopping Aspirin, Coumadin, Plavix ,Eliquis, Effient, or Pradaxa,  and Pletal.  ____Stop Anti-inflammatories such as Advil, Aleve, Ibuprofen, Motrin, Naproxen, Naprosyn, Goodies powders or aspirin products. OK to take Tylenol    ____ Stop supplements until after surgery.    ____ Bring C-Pap to the hospital.

## 2017-06-05 ENCOUNTER — Ambulatory Visit: Admission: RE | Admit: 2017-06-05 | Payer: 59 | Source: Ambulatory Visit

## 2017-06-05 ENCOUNTER — Encounter
Admission: RE | Admit: 2017-06-05 | Discharge: 2017-06-05 | Disposition: A | Discharge status: Home / Self Care | Payer: 59 | Source: Ambulatory Visit | Attending: General Surgery | Admitting: General Surgery

## 2017-06-05 DIAGNOSIS — I10 Essential (primary) hypertension: Secondary | ICD-10-CM | POA: Diagnosis not present

## 2017-06-05 DIAGNOSIS — Z01812 Encounter for preprocedural laboratory examination: Secondary | ICD-10-CM | POA: Insufficient documentation

## 2017-06-05 DIAGNOSIS — Z01810 Encounter for preprocedural cardiovascular examination: Secondary | ICD-10-CM | POA: Insufficient documentation

## 2017-06-05 LAB — POTASSIUM: Potassium: 3.5 mmol/L (ref 3.5–5.1)

## 2017-06-11 ENCOUNTER — Encounter: Payer: Self-pay | Admitting: *Deleted

## 2017-06-11 MED ORDER — CEFAZOLIN SODIUM-DEXTROSE 2-4 GM/100ML-% IV SOLN
2.0000 g | INTRAVENOUS | Status: AC
  Administered 2017-06-12: 2 g via INTRAVENOUS

## 2017-06-12 ENCOUNTER — Encounter
Admission: RE | Disposition: A | Discharge status: Home / Self Care | Payer: Self-pay | Source: Ambulatory Visit | Attending: General Surgery

## 2017-06-12 ENCOUNTER — Ambulatory Visit
Admission: RE | Admit: 2017-06-12 | Discharge: 2017-06-12 | Disposition: A | Discharge status: Home / Self Care | Payer: 59 | Source: Ambulatory Visit | Attending: General Surgery | Admitting: General Surgery

## 2017-06-12 ENCOUNTER — Ambulatory Visit: Payer: 59 | Admitting: Certified Registered Nurse Anesthetist

## 2017-06-12 DIAGNOSIS — Z8249 Family history of ischemic heart disease and other diseases of the circulatory system: Secondary | ICD-10-CM | POA: Diagnosis not present

## 2017-06-12 DIAGNOSIS — Z79899 Other long term (current) drug therapy: Secondary | ICD-10-CM | POA: Insufficient documentation

## 2017-06-12 DIAGNOSIS — N6032 Fibrosclerosis of left breast: Secondary | ICD-10-CM | POA: Diagnosis not present

## 2017-06-12 DIAGNOSIS — Z803 Family history of malignant neoplasm of breast: Secondary | ICD-10-CM | POA: Insufficient documentation

## 2017-06-12 DIAGNOSIS — F329 Major depressive disorder, single episode, unspecified: Secondary | ICD-10-CM | POA: Insufficient documentation

## 2017-06-12 DIAGNOSIS — N6042 Mammary duct ectasia of left breast: Secondary | ICD-10-CM | POA: Diagnosis not present

## 2017-06-12 DIAGNOSIS — Z8611 Personal history of tuberculosis: Secondary | ICD-10-CM | POA: Diagnosis not present

## 2017-06-12 DIAGNOSIS — N6321 Unspecified lump in the left breast, upper outer quadrant: Secondary | ICD-10-CM | POA: Diagnosis not present

## 2017-06-12 DIAGNOSIS — M069 Rheumatoid arthritis, unspecified: Secondary | ICD-10-CM | POA: Diagnosis not present

## 2017-06-12 DIAGNOSIS — E7800 Pure hypercholesterolemia, unspecified: Secondary | ICD-10-CM | POA: Diagnosis not present

## 2017-06-12 DIAGNOSIS — D242 Benign neoplasm of left breast: Secondary | ICD-10-CM | POA: Diagnosis not present

## 2017-06-12 DIAGNOSIS — I10 Essential (primary) hypertension: Secondary | ICD-10-CM | POA: Insufficient documentation

## 2017-06-12 DIAGNOSIS — N61 Mastitis without abscess: Secondary | ICD-10-CM | POA: Insufficient documentation

## 2017-06-12 DIAGNOSIS — N6082 Other benign mammary dysplasias of left breast: Secondary | ICD-10-CM | POA: Insufficient documentation

## 2017-06-12 HISTORY — PX: BREAST DUCTAL SYSTEM EXCISION: SHX5242

## 2017-06-12 HISTORY — PX: BREAST BIOPSY: SHX20

## 2017-06-12 SURGERY — EXCISION DUCTAL SYSTEM BREAST
Anesthesia: General | Laterality: Left | Wound class: Clean

## 2017-06-12 MED ORDER — HYDROCODONE-ACETAMINOPHEN 5-325 MG PO TABS
1.0000 | ORAL_TABLET | ORAL | 0 refills | Status: DC | PRN
Start: 2017-06-12 — End: 2018-02-08

## 2017-06-12 MED ORDER — LACTATED RINGERS IV SOLN
INTRAVENOUS | Status: DC
  Administered 2017-06-12: 13:00:00 via INTRAVENOUS

## 2017-06-12 MED ORDER — LACTATED RINGERS IV SOLN
INTRAVENOUS | Status: DC | PRN
  Administered 2017-06-12: 14:00:00 via INTRAVENOUS

## 2017-06-12 MED ORDER — ONDANSETRON HCL 4 MG/2ML IJ SOLN
INTRAMUSCULAR | Status: DC | PRN
  Administered 2017-06-12: 4 mg via INTRAVENOUS

## 2017-06-12 MED ORDER — FENTANYL CITRATE (PF) 100 MCG/2ML IJ SOLN
INTRAMUSCULAR | Status: AC
  Filled 2017-06-12: qty 2

## 2017-06-12 MED ORDER — LIDOCAINE HCL (CARDIAC) 20 MG/ML IV SOLN
INTRAVENOUS | Status: DC | PRN
  Administered 2017-06-12: 80 mg via INTRAVENOUS

## 2017-06-12 MED ORDER — PROPOFOL 10 MG/ML IV BOLUS
INTRAVENOUS | Status: DC | PRN
  Administered 2017-06-12: 170 mg via INTRAVENOUS

## 2017-06-12 MED ORDER — DEXAMETHASONE SODIUM PHOSPHATE 10 MG/ML IJ SOLN
INTRAMUSCULAR | Status: DC | PRN
  Administered 2017-06-12: 10 mg via INTRAVENOUS

## 2017-06-12 MED ORDER — BUPIVACAINE-EPINEPHRINE (PF) 0.5% -1:200000 IJ SOLN
INTRAMUSCULAR | Status: DC | PRN
  Administered 2017-06-12: 30 mL via PERINEURAL

## 2017-06-12 MED ORDER — EPHEDRINE SULFATE 50 MG/ML IJ SOLN
INTRAMUSCULAR | Status: DC | PRN
  Administered 2017-06-12: 10 mg via INTRAVENOUS

## 2017-06-12 MED ORDER — FENTANYL CITRATE (PF) 100 MCG/2ML IJ SOLN: 25.0000 ug | INTRAMUSCULAR | Status: DC | PRN

## 2017-06-12 MED ORDER — PROPOFOL 10 MG/ML IV BOLUS
INTRAVENOUS | Status: AC
  Filled 2017-06-12: qty 20

## 2017-06-12 MED ORDER — FAMOTIDINE 20 MG PO TABS
20.0000 mg | ORAL_TABLET | Freq: Once | ORAL | Status: AC
  Administered 2017-06-12: 20 mg via ORAL

## 2017-06-12 MED ORDER — DEXAMETHASONE SODIUM PHOSPHATE 10 MG/ML IJ SOLN
INTRAMUSCULAR | Status: AC
  Filled 2017-06-12: qty 1

## 2017-06-12 MED ORDER — LIDOCAINE HCL (PF) 2 % IJ SOLN
INTRAMUSCULAR | Status: AC
  Filled 2017-06-12: qty 10

## 2017-06-12 MED ORDER — MIDAZOLAM HCL 2 MG/2ML IJ SOLN
INTRAMUSCULAR | Status: DC | PRN
  Administered 2017-06-12: 2 mg via INTRAVENOUS

## 2017-06-12 MED ORDER — MIDAZOLAM HCL 2 MG/2ML IJ SOLN
INTRAMUSCULAR | Status: AC
  Filled 2017-06-12: qty 2

## 2017-06-12 MED ORDER — CEFAZOLIN SODIUM-DEXTROSE 2-4 GM/100ML-% IV SOLN
INTRAVENOUS | Status: AC
  Filled 2017-06-12: qty 100

## 2017-06-12 MED ORDER — FENTANYL CITRATE (PF) 100 MCG/2ML IJ SOLN
INTRAMUSCULAR | Status: DC | PRN
  Administered 2017-06-12 (×2): 25 ug via INTRAVENOUS
  Administered 2017-06-12: 50 ug via INTRAVENOUS

## 2017-06-12 MED ORDER — FAMOTIDINE 20 MG PO TABS
ORAL_TABLET | ORAL | Status: AC
  Filled 2017-06-12: qty 1

## 2017-06-12 MED ORDER — ACETAMINOPHEN 10 MG/ML IV SOLN
INTRAVENOUS | Status: AC
  Filled 2017-06-12: qty 100

## 2017-06-12 MED ORDER — KETOROLAC TROMETHAMINE 30 MG/ML IJ SOLN
INTRAMUSCULAR | Status: DC | PRN
  Administered 2017-06-12: 30 mg via INTRAVENOUS

## 2017-06-12 MED ORDER — KETOROLAC TROMETHAMINE 30 MG/ML IJ SOLN
INTRAMUSCULAR | Status: AC
  Filled 2017-06-12: qty 1

## 2017-06-12 MED ORDER — ONDANSETRON HCL 4 MG/2ML IJ SOLN
INTRAMUSCULAR | Status: AC
  Filled 2017-06-12: qty 2

## 2017-06-12 MED ORDER — PROMETHAZINE HCL 25 MG/ML IJ SOLN: 6.2500 mg | INTRAMUSCULAR | Status: DC | PRN

## 2017-06-12 MED ORDER — BUPIVACAINE-EPINEPHRINE (PF) 0.5% -1:200000 IJ SOLN
INTRAMUSCULAR | Status: AC
  Filled 2017-06-12: qty 30

## 2017-06-12 MED ORDER — ACETAMINOPHEN 10 MG/ML IV SOLN
INTRAVENOUS | Status: DC | PRN
  Administered 2017-06-12: 1000 mg via INTRAVENOUS

## 2017-06-12 SURGICAL SUPPLY — 39 items
BINDER BREAST LRG (GAUZE/BANDAGES/DRESSINGS) ×3 IMPLANT
BLADE SURG 15 STRL SS SAFETY (BLADE) ×6 IMPLANT
CANISTER SUCT 1200ML W/VALVE (MISCELLANEOUS) ×3 IMPLANT
CHLORAPREP W/TINT 26ML (MISCELLANEOUS) ×3 IMPLANT
CLOSURE WOUND 1/2 X4 (GAUZE/BANDAGES/DRESSINGS) ×1
CNTNR SPEC 2.5X3XGRAD LEK (MISCELLANEOUS) ×1
CONT SPEC 4OZ STER OR WHT (MISCELLANEOUS) ×2
CONTAINER SPEC 2.5X3XGRAD LEK (MISCELLANEOUS) ×1 IMPLANT
COVER PROBE FLX POLY STRL (MISCELLANEOUS) ×3 IMPLANT
DEVICE DUBIN SPECIMEN MAMMOGRA (MISCELLANEOUS) ×3 IMPLANT
DRAPE LAPAROTOMY 100X77 ABD (DRAPES) ×3 IMPLANT
DRSG TELFA 4X3 1S NADH ST (GAUZE/BANDAGES/DRESSINGS) ×3 IMPLANT
ELECT CAUTERY BLADE TIP 2.5 (TIP) ×3
ELECT REM PT RETURN 9FT ADLT (ELECTROSURGICAL) ×3
ELECTRODE CAUTERY BLDE TIP 2.5 (TIP) ×1 IMPLANT
ELECTRODE REM PT RTRN 9FT ADLT (ELECTROSURGICAL) ×1 IMPLANT
GAUZE FLUFF 18X24 1PLY STRL (GAUZE/BANDAGES/DRESSINGS) ×3 IMPLANT
GLOVE BIO SURGEON STRL SZ7.5 (GLOVE) ×3 IMPLANT
GLOVE INDICATOR 8.0 STRL GRN (GLOVE) ×3 IMPLANT
GOWN STRL REUS W/ TWL LRG LVL3 (GOWN DISPOSABLE) ×2 IMPLANT
GOWN STRL REUS W/TWL LRG LVL3 (GOWN DISPOSABLE) ×4
KIT TURNOVER KIT A (KITS) ×3 IMPLANT
LABEL OR SOLS (LABEL) ×3 IMPLANT
MARGIN MAP 10MM (MISCELLANEOUS) ×3 IMPLANT
NEEDLE HYPO 22GX1.5 SAFETY (NEEDLE) ×3 IMPLANT
NEEDLE HYPO 25X1 1.5 SAFETY (NEEDLE) ×3 IMPLANT
PACK BASIN MINOR ARMC (MISCELLANEOUS) ×3 IMPLANT
SHEARS HARMONIC 9CM CVD (BLADE) ×3 IMPLANT
STRIP CLOSURE SKIN 1/2X4 (GAUZE/BANDAGES/DRESSINGS) ×2 IMPLANT
SUT ETHILON 3-0 FS-10 30 BLK (SUTURE) ×3
SUT VIC AB 2-0 CT1 27 (SUTURE) ×4
SUT VIC AB 2-0 CT1 TAPERPNT 27 (SUTURE) ×2 IMPLANT
SUT VIC AB 4-0 FS2 27 (SUTURE) ×3 IMPLANT
SUTURE EHLN 3-0 FS-10 30 BLK (SUTURE) ×1 IMPLANT
SWABSTK COMLB BENZOIN TINCTURE (MISCELLANEOUS) ×3 IMPLANT
SYR CONTROL 10ML (SYRINGE) ×3 IMPLANT
SYS BIOPSY MAX CORE 14GX10 (NEEDLE) ×3 IMPLANT
TAPE TRANSPORE STRL 2 31045 (GAUZE/BANDAGES/DRESSINGS) ×3 IMPLANT
WATER STERILE IRR 1000ML POUR (IV SOLUTION) ×3 IMPLANT

## 2017-06-12 NOTE — Discharge Instructions (Signed)

## 2017-06-12 NOTE — Anesthesia Post-op Follow-up Note (Signed)
Anesthesia QCDR form completed.        

## 2017-06-12 NOTE — Anesthesia Postprocedure Evaluation (Signed)
Anesthesia Post Note  Patient: Kathleen Yu  Procedure(s) Performed: EXCISION DUCTAL SYSTEM BREAST (Left ) BREAST BIOPSY (Left )  Patient location during evaluation: PACU Anesthesia Type: General Level of consciousness: awake and alert Pain management: pain level controlled Vital Signs Assessment: post-procedure vital signs reviewed and stable Respiratory status: spontaneous breathing, nonlabored ventilation, respiratory function stable and patient connected to nasal cannula oxygen Cardiovascular status: blood pressure returned to baseline and stable Postop Assessment: no apparent nausea or vomiting Anesthetic complications: no     Last Vitals:  Vitals:   06/12/17 1606 06/12/17 1631  BP: 139/77 138/71  Pulse: 92 89  Resp: 16   Temp: (!) 36.2 C   SpO2: 98% 97%    Last Pain:  Vitals:   06/12/17 1606  TempSrc: Temporal                 Shondrea Steinert S

## 2017-06-12 NOTE — Op Note (Signed)
Preoperative diagnosis: Focal thickening upper inner quadrant of the left breast, retroareolar fibrosis and ductal dilatation.  Postoperative diagnosis: Same.  Operative procedure: 1) ultrasound-guided core biopsy of the left upper inner quadrant area of focal thickening with clip placement; 2) excision of the left retroareolar ductal structures.  Operating Surgeon: Hervey Ard, MD.  Anesthesia: General by LMA, Marcaine 0.5% with 1-200,000 units of epinephrine, 30 cc.  Estimated blood loss: 5 cc  Clinical note: This patient recently had an abnormal mammogram and underwent a radiologically guided biopsy of the retroareolar ductal tissue with suggestions of partial cyst wall and perhaps an early papilloma.  She had marked distortion of the nipple areolar complex over the last 6 months suggesting chronic inflammatory process and was felt to be a candidate for excision.  On clinical exam there was focal thickening of the left breast at the 11 o'clock position, 8.5 cm from the nipple.  No corresponding mammographic abnormality.  Ultrasound had shown a focal distortion in the plane between the fat in the breast parenchyma in this location.  She is felt to be a candidate for biopsy at this time.  Operative note: With the patient under adequate general anesthesia the neck chest and axilla was cleansed with ChloraPrep and draped.  Ultrasound was used to to correlate with the area of palpable thickening as noted above.  This was at the junction of the adipose and breast parenchyma.  5 core samples with a 14-gauge Bard biopsy device were completed showing primarily fatty tissue.  A post biopsy clip was placed under ultrasound guidance.  The small incision used for biopsy was closed with benzoin and Steri-Strip at the end of the procedure.  A circumareolar incision from the 7 to 11 o'clock position was made carried down through skin subtendinous tissue with hemostasis achieved by electrocautery.  The adipose  tissue was left on the undersurface of the areola.  The block of tissue including the retroareolar ductal structures was approximately 3.5 x 3.5 x 5 cm in diameter.  A orientating suture on the ductal structures that were entering the base of the nipple was placed.  The specimen was sent for routine histology.  Thick, creamy fluid was noted in multiple ducts.  The previous biopsy cavity was included in the specimen.  Hemostasis was with electrocautery.  The deep tissue was approximated with multiple pursestring sutures to reinforce the retroareolar tissue.  The adipose layer was approximated with interrupted 2-0 Vicryl sutures and the skin closed with interrupted 4-0 Vicryl subcuticular sutures.  Benzoin Steri-Strips were applied.  Multilayer Telfa pad to prevent compression of the nipple was placed followed by fluff gauze and a surgical binder.  The patient tolerated the procedure well and was taken to recovery room in stable condition.

## 2017-06-12 NOTE — Anesthesia Procedure Notes (Signed)
Procedure Name: LMA Insertion Date/Time: 06/12/2017 2:13 PM Performed by: Doreen Salvage, CRNA Pre-anesthesia Checklist: Patient identified, Patient being monitored, Timeout performed, Emergency Drugs available and Suction available Patient Re-evaluated:Patient Re-evaluated prior to induction Oxygen Delivery Method: Circle system utilized Preoxygenation: Pre-oxygenation with 100% oxygen Induction Type: IV induction Ventilation: Mask ventilation without difficulty LMA: LMA inserted LMA Size: 4.5 Tube type: Oral Number of attempts: 1 Placement Confirmation: positive ETCO2 and breath sounds checked- equal and bilateral Tube secured with: Tape Dental Injury: Teeth and Oropharynx as per pre-operative assessment

## 2017-06-12 NOTE — Progress Notes (Signed)
Dr. Byrnett into see 

## 2017-06-12 NOTE — Anesthesia Preprocedure Evaluation (Signed)
Anesthesia Evaluation  Patient identified by MRN, date of birth, ID band Patient awake    Reviewed: Allergy & Precautions, H&P , NPO status , Patient's Chart, lab work & pertinent test results, reviewed documented beta blocker date and time   History of Anesthesia Complications Negative for: history of anesthetic complications  Airway Mallampati: II  TM Distance: >3 FB Neck ROM: full    Dental  (+) Dental Advidsory Given   Pulmonary neg pulmonary ROS,           Cardiovascular Exercise Tolerance: Good hypertension, (-) angina(-) CAD, (-) Past MI, (-) Cardiac Stents and (-) CABG (-) dysrhythmias (-) Valvular Problems/Murmurs     Neuro/Psych PSYCHIATRIC DISORDERS Depression negative neurological ROS     GI/Hepatic negative GI ROS, Neg liver ROS,   Endo/Other  negative endocrine ROS  Renal/GU negative Renal ROS  negative genitourinary   Musculoskeletal   Abdominal   Peds  Hematology  (+) Blood dyscrasia, anemia ,   Anesthesia Other Findings Past Medical History: No date: Abnormal liver function tests No date: Allergy No date: Anemia     Comment:  H/O No date: Arthritis     Comment:  RA No date: Depression No date: Fatty liver No date: Hypercholesterolemia No date: Hypertension No date: Migraine headache     Comment:  better since menopause No date: Neuromuscular disorder (HCC) No date: Recurrent sinusitis     Comment:  allergies 1980'S: Tuberculosis     Comment:  + TB TEST-WAS TREATED   Reproductive/Obstetrics negative OB ROS                             Anesthesia Physical Anesthesia Plan  ASA: II  Anesthesia Plan: General   Post-op Pain Management:    Induction: Intravenous  PONV Risk Score and Plan: 3 and Ondansetron and Dexamethasone  Airway Management Planned: LMA  Additional Equipment:   Intra-op Plan:   Post-operative Plan: Extubation in OR  Informed  Consent: I have reviewed the patients History and Physical, chart, labs and discussed the procedure including the risks, benefits and alternatives for the proposed anesthesia with the patient or authorized representative who has indicated his/her understanding and acceptance.   Dental Advisory Given  Plan Discussed with: Anesthesiologist, CRNA and Surgeon  Anesthesia Plan Comments:         Anesthesia Quick Evaluation

## 2017-06-12 NOTE — Progress Notes (Signed)
No change in clinical history or exam.  For left breast biopsy and excision of left retroareolar tissue.

## 2017-06-12 NOTE — Transfer of Care (Signed)
Immediate Anesthesia Transfer of Care Note  Patient: Kathleen Yu  Procedure(s) Performed: Procedure(s): EXCISION DUCTAL SYSTEM BREAST (Left) BREAST BIOPSY (Left)  Patient Location: PACU  Anesthesia Type:General  Level of Consciousness: sedated  Airway & Oxygen Therapy: Patient Spontanous Breathing and Patient connected to face mask oxygen  Post-op Assessment: Report given to RN and Post -op Vital signs reviewed and stable  Post vital signs: Reviewed and stable  Last Vitals:  Vitals:   06/12/17 1226 06/12/17 1515  BP: (!) 147/84 136/85  Pulse: 87 92  Resp: 18 14  Temp: 36.4 C 36.6 C  SpO2: 21% 224%    Complications: No apparent anesthesia complications

## 2017-06-13 ENCOUNTER — Encounter: Payer: Self-pay | Admitting: General Surgery

## 2017-06-13 NOTE — H&P (Signed)
No interval change in clinical history or exam.

## 2017-06-15 LAB — SURGICAL PATHOLOGY

## 2017-06-16 ENCOUNTER — Encounter: Payer: Self-pay | Admitting: Internal Medicine

## 2017-06-16 ENCOUNTER — Telehealth: Payer: Self-pay | Admitting: General Surgery

## 2017-06-16 NOTE — Telephone Encounter (Signed)
Message left the pathology was fine.  Will review details of fat necrosis (11:00 core biopsy) and atypical lobular hyperplasia at follow-up.

## 2017-06-17 ENCOUNTER — Ambulatory Visit: Payer: 59

## 2017-06-17 ENCOUNTER — Encounter: Payer: Self-pay | Admitting: Internal Medicine

## 2017-06-17 ENCOUNTER — Encounter: Payer: Self-pay | Admitting: General Surgery

## 2017-06-17 NOTE — Progress Notes (Signed)
Patient seen in PACU with chaperone  Wound clean. No evidence of hematoma. Nipple height normal.  Core biopsy site without bruising. (Fat necrosis on path).   Discussed path.   At present, no indication for chemoprevention with microscopic ALH.  Will recheck in one month.

## 2017-06-23 ENCOUNTER — Ambulatory Visit: Payer: 59 | Admitting: General Surgery

## 2017-06-24 ENCOUNTER — Encounter: Payer: Self-pay | Admitting: Internal Medicine

## 2017-06-26 ENCOUNTER — Other Ambulatory Visit: Payer: Self-pay | Admitting: Internal Medicine

## 2017-06-26 ENCOUNTER — Encounter: Payer: Self-pay | Admitting: Internal Medicine

## 2017-08-17 ENCOUNTER — Other Ambulatory Visit: Payer: Self-pay | Admitting: Internal Medicine

## 2017-08-17 ENCOUNTER — Ambulatory Visit: Payer: Self-pay | Admitting: Internal Medicine

## 2017-09-14 ENCOUNTER — Other Ambulatory Visit: Payer: Self-pay | Admitting: Internal Medicine

## 2017-10-05 ENCOUNTER — Encounter: Payer: Self-pay | Admitting: Internal Medicine

## 2017-10-05 ENCOUNTER — Ambulatory Visit: Payer: 59 | Admitting: Internal Medicine

## 2017-10-05 VITALS — BP 110/80 | HR 84 | Temp 98.3°F | Resp 18 | Ht 62.0 in | Wt 193.1 lb

## 2017-10-05 DIAGNOSIS — Z6835 Body mass index (BMI) 35.0-35.9, adult: Secondary | ICD-10-CM

## 2017-10-05 DIAGNOSIS — R945 Abnormal results of liver function studies: Secondary | ICD-10-CM

## 2017-10-05 DIAGNOSIS — Z114 Encounter for screening for human immunodeficiency virus [HIV]: Secondary | ICD-10-CM

## 2017-10-05 DIAGNOSIS — E7800 Pure hypercholesterolemia, unspecified: Secondary | ICD-10-CM

## 2017-10-05 DIAGNOSIS — R739 Hyperglycemia, unspecified: Secondary | ICD-10-CM

## 2017-10-05 DIAGNOSIS — I10 Essential (primary) hypertension: Secondary | ICD-10-CM

## 2017-10-05 DIAGNOSIS — R7989 Other specified abnormal findings of blood chemistry: Secondary | ICD-10-CM

## 2017-10-05 LAB — BASIC METABOLIC PANEL
BUN: 19 mg/dL (ref 6–23)
CALCIUM: 9.8 mg/dL (ref 8.4–10.5)
CHLORIDE: 103 meq/L (ref 96–112)
CO2: 27 mEq/L (ref 19–32)
CREATININE: 0.85 mg/dL (ref 0.40–1.20)
GFR: 72.22 mL/min (ref >60.00)
Glucose, Bld: 101 mg/dL — ABNORMAL HIGH (ref 70–99)
Potassium: 3.6 mEq/L (ref 3.5–5.1)
Sodium: 139 mEq/L (ref 135–145)

## 2017-10-05 LAB — LIPID PANEL
CHOL/HDL RATIO: 5
Cholesterol: 198 mg/dL (ref 0–200)
HDL: 36.8 mg/dL — AB (ref >39.00)
NONHDL: 161.53
TRIGLYCERIDES: 208 mg/dL — AB (ref 0.0–149.0)
VLDL: 41.6 mg/dL — ABNORMAL HIGH (ref 0.0–40.0)

## 2017-10-05 LAB — HEPATIC FUNCTION PANEL
ALT: 60 U/L — AB (ref 0–35)
AST: 33 U/L (ref 0–37)
Albumin: 4.5 g/dL (ref 3.5–5.2)
Alkaline Phosphatase: 46 U/L (ref 39–117)
BILIRUBIN DIRECT: 0.1 mg/dL (ref 0.0–0.3)
BILIRUBIN TOTAL: 0.6 mg/dL (ref 0.2–1.2)
Total Protein: 7.1 g/dL (ref 6.0–8.3)

## 2017-10-05 LAB — LDL CHOLESTEROL, DIRECT: Direct LDL: 133 mg/dL

## 2017-10-05 LAB — HEMOGLOBIN A1C: HEMOGLOBIN A1C: 6.3 % (ref 4.6–6.5)

## 2017-10-05 NOTE — Progress Notes (Signed)
[name], female   DOB: 08/26/1955, 62 y.o.   MRN: [id number]   Subjective:    [name], female    DOB: 07-23-1955, 62 y.o.   MRN: [id number]  HPI  Patient here for a scheduled follow up.  She reports she is doing relatively well.  Increased stress recently with her mother's issues and recent move here.  Overall she feels she is handling things relatively well.  Does not feel needs anything more at this time.  No chest pain.  No sob.  No acid reflux.  No abdominal pain.  Bowels moving.  Overall she feels she is doing well.     Past Medical History:  Diagnosis Date  . Abnormal liver function tests   . Allergy   . Anemia    H/O  . Arthritis    RA  . Depression   . Fatty liver   . Hypercholesterolemia   . Hypertension   . Migraine headache    better since menopause  . Neuromuscular disorder ([place])   . Recurrent sinusitis    allergies  . Tuberculosis 1980'S   + TB TEST-WAS TREATED   Past Surgical History:  Procedure Laterality Date  . BILATERAL CARPAL TUNNEL RELEASE    . BREAST BIOPSY Left 2004   x 2  . BREAST BIOPSY Left 05/12/2017   FRAGMENTS WITH FEATURES OF AN EARLY INTRADUCTAL PAPILLOMA WITH aprocine changes  . BREAST BIOPSY Left 06/12/2017   Procedure: BREAST BIOPSY;  Surgeon: Robert Bellow, MD;  Location: [facility];  Service: General;  Laterality: Left;  . BREAST DUCTAL SYSTEM EXCISION Left 06/12/2017   Procedure: EXCISION DUCTAL SYSTEM BREAST;  Surgeon: Robert Bellow, MD;  Location: [facility];  Service: General;  Laterality: Left;  . COLONOSCOPY WITH PROPOFOL N/A 02/11/2016   Procedure: COLONOSCOPY WITH PROPOFOL;  Surgeon: Lucilla Lame, MD;  Location: [place];  Service: Endoscopy;  Laterality: N/A;  . 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 FRACTURE SURGERY  2017  . OSTECTOMY  Left 06/01/2015   Procedure: OSTECTOMY/ part. excision fracture fragment left 4th toe;  Surgeon: Sharlotte Alamo, DPM;  Location: [facility];  Service: Podiatry;  Laterality: Left;  . POLYPECTOMY N/A 02/11/2016   Procedure: POLYPECTOMY;  Surgeon: Lucilla Lame, MD;  Location: [place];  Service: Endoscopy;  Laterality: N/A;  . RHINOPLASTY    . TUBAL LIGATION  1997   Family History  Problem Relation Age of Onset  . Heart disease Father        died age 32 (MI)  . Heart disease Unknown        4 uncles - CABG  . Colon cancer Unknown        great aunt  . Heart disease Paternal Grandmother        first MI - 21s  . Migraines Mother   . Breast cancer Mother 45  . Hypertension Mother   . Hypertension Maternal Grandmother   . Early death Maternal Grandmother   . Diabetes Paternal Grandfather   . Stroke Paternal Grandfather   . Depression Maternal Aunt   . Breast cancer Maternal Aunt   . Breast cancer Paternal Aunt        2 aunts   Social History   Socioeconomic History  . Marital status: Married    Spouse name:  Not on file  . Number of children: 2  . Years of education: Not on file  . Highest education level: Not on file  Occupational History  . Not on file  Social Needs  . Financial resource strain: Not on file  . Food insecurity:    Worry: Not on file    Inability: Not on file  . Transportation needs:    Medical: Not on file    Non-medical: Not on file  Tobacco Use  . Smoking status: Never Smoker  . Smokeless tobacco: Never Used  Substance and Sexual Activity  . Alcohol use: Yes    Alcohol/week: 0.6 oz    Types: 1 Cans of beer per week    Comment: occasional  . Drug use: No  . Sexual activity: Yes    Birth control/protection: Post-menopausal, Surgical  Lifestyle  . Physical activity:    Days per week: Not on file    Minutes per session: Not on file  . Stress: Not on file  Relationships  . Social connections:    Talks on phone: Not on file    Gets together:  Not on file    Attends religious service: Not on file    Active member of club or organization: Not on file    Attends meetings of clubs or organizations: Not on file    Relationship status: Not on file  Other Topics Concern  . Not on file  Social History Narrative  . Not on file    Outpatient Encounter Medications as of 10/05/2017  Medication Sig  . acetaminophen (TYLENOL) 500 MG tablet Take 1,000 mg by mouth daily as needed for moderate pain.   Marland Kitchen amLODipine (NORVASC) 10 MG tablet TAKE 1 TABLET BY MOUTH DAILY  . fexofenadine-pseudoephedrine (ALLEGRA-D) 60-120 MG 12 hr tablet Take 1 tablet by mouth daily as needed (allergies).   . fluticasone (FLONASE) 50 MCG/ACT nasal spray Place 2 sprays into the nose daily as needed for allergies.   . hydrochlorothiazide (HYDRODIURIL) 25 MG tablet TAKE 1 TABLET BY MOUTH DAILY  . HYDROcodone-acetaminophen (NORCO/VICODIN) 5-325 MG tablet Take 1 tablet by mouth every 4 (four) hours as needed for moderate pain.  Marland Kitchen ibuprofen (ADVIL,MOTRIN) 200 MG tablet Take 600-800 mg by mouth daily as needed (arthritis).   Marland Kitchen lisinopril (PRINIVIL,ZESTRIL) 40 MG tablet TAKE 1 TABLET BY MOUTH DAILY  . loratadine (CLARITIN) 10 MG tablet Take 10 mg by mouth daily.   . naproxen sodium (ANAPROX) 220 MG tablet Take 440 mg by mouth daily as needed (pain).   Marland Kitchen PARoxetine (PAXIL) 10 MG tablet TAKE 1 TABLET BY MOUTH EVERY MORNING   No facility-administered encounter medications on file as of 10/05/2017.     Review of Systems  Constitutional: Negative for appetite change and unexpected weight change.  HENT: Negative for congestion and sinus pressure.   Respiratory: Negative for cough, chest tightness and shortness of breath.   Cardiovascular: Negative for chest pain, palpitations and leg swelling.  Gastrointestinal: Negative for abdominal pain, diarrhea, nausea and vomiting.  Genitourinary: Negative for difficulty urinating and dysuria.  Musculoskeletal: Negative for joint swelling  and myalgias.  Skin: Negative for color change and rash.  Neurological: Negative for dizziness, light-headedness and headaches.  Psychiatric/Behavioral: Negative for agitation and dysphoric mood.       Objective:    Physical Exam  Constitutional: She appears well-developed and well-nourished. No distress.  HENT:  Nose: Nose normal.  Mouth/Throat: Oropharynx is clear and moist.  Neck: Neck supple. No thyromegaly present.  Cardiovascular: Normal rate and regular rhythm.  Pulmonary/Chest: Breath sounds normal. No respiratory distress. She has no wheezes.  Abdominal: Soft. Bowel sounds are normal. There is no tenderness.  Musculoskeletal: She exhibits no edema or tenderness.  Lymphadenopathy:    She has no cervical adenopathy.  Skin: No rash noted. No erythema.  Psychiatric: She has a normal mood and affect. Her behavior is normal.    BP 110/80   Pulse 84   Temp 98.3 F (36.8 C) (Oral)   Resp 18   Ht 5' 2" (1.575 m)   Wt 193 lb 2 oz (87.6 kg)   LMP 05/30/2007   SpO2 95%   BMI 35.32 kg/m  Wt Readings from Last 3 Encounters:  10/05/17 193 lb 2 oz (87.6 kg)  06/03/17 195 lb (88.5 kg)  04/20/17 196 lb 6.4 oz (89.1 kg)     Lab Results  Component Value Date   WBC 7.7 12/15/2016   HGB 13.5 12/15/2016   HCT 39.6 12/15/2016   PLT 249.0 12/15/2016   GLUCOSE 101 (H) 10/05/2017   CHOL 198 10/05/2017   TRIG 208.0 (H) 10/05/2017   HDL 36.80 (L) 10/05/2017   LDLDIRECT 133.0 10/05/2017   LDLCALC 141 (H) 04/20/2017   ALT 60 (H) 10/05/2017   AST 33 10/05/2017   NA 139 10/05/2017   K 3.6 10/05/2017   CL 103 10/05/2017   CREATININE 0.85 10/05/2017   BUN 19 10/05/2017   CO2 27 10/05/2017   TSH 2.73 12/15/2016   HGBA1C 6.3 10/05/2017   MICROALBUR 3.0 (H) 12/11/2014       Assessment & Plan:   Problem List Items Addressed This Visit    Abnormal liver function tests - Primary    Previously worked up by GI.  Previous ultrasound - fatty liver.  Follow liver function tests.          Relevant Orders   Hepatic function panel (Completed)   Hepatic function panel   BMI 35.0-35.9,adult    Discussed diet and exercise.  Follow.        Hypercholesterolemia    Low carb diet and exercise.  Follow lipid panel.        Relevant Orders   Lipid panel (Completed)   Lipid panel   Hyperglycemia    Low carb diet and exercise.  Follow met b and a1c.        Relevant Orders   Hemoglobin A1c (Completed)   Hemoglobin A1c   Hypertension    Blood pressure under good control.  Continue same medication regimen.  Follow pressures.  Follow metabolic panel.        Relevant Orders   Basic metabolic panel (Completed)   CBC with Differential/Platelet   TSH   Basic metabolic panel    Other Visit Diagnoses    Screening for HIV (human immunodeficiency virus)       Relevant Orders   HIV antibody (with reflex) (Completed)       SCOTT, CHARLENE, MD  

## 2017-10-06 ENCOUNTER — Encounter: Payer: Self-pay | Admitting: Internal Medicine

## 2017-10-06 LAB — HIV ANTIBODY (ROUTINE TESTING W REFLEX): HIV 1&2 Ab, 4th Generation: NONREACTIVE

## 2017-10-07 ENCOUNTER — Encounter: Payer: Self-pay | Admitting: Internal Medicine

## 2017-10-07 ENCOUNTER — Telehealth: Payer: Self-pay

## 2017-10-07 NOTE — Assessment & Plan Note (Signed)
Low carb diet and exercise.  Follow met b and a1c.   

## 2017-10-07 NOTE — Assessment & Plan Note (Signed)
Discussed diet and exercise.  Follow.  

## 2017-10-07 NOTE — Assessment & Plan Note (Signed)
Previously worked up by GI.  Previous ultrasound - fatty liver.  Follow liver function tests.

## 2017-10-07 NOTE — Telephone Encounter (Signed)
I called patient to notify her that HIV test was negative. Patient had no further questions.

## 2017-10-07 NOTE — Assessment & Plan Note (Signed)
Blood pressure under good control.  Continue same medication regimen.  Follow pressures.  Follow metabolic panel.   

## 2017-10-07 NOTE — Assessment & Plan Note (Signed)
Low carb diet and exercise.  Follow lipid panel.  

## 2017-10-08 ENCOUNTER — Telehealth: Payer: Self-pay

## 2017-10-08 NOTE — Telephone Encounter (Signed)
Copied from [place] [id number]. Topic: Quick Communication - Office Called Patient >> Oct 08, 2017 11:12 AM Yvette Rack wrote: Reason for CRM: pt calling Sarah back about labs

## 2017-10-08 NOTE — Telephone Encounter (Signed)
LVM for patient to call back for lab results 

## 2017-10-08 NOTE — Telephone Encounter (Signed)
I spoke with patient & gave her results of labs. Patient had no further questions at this time.

## 2017-11-10 IMAGING — MG MM SCREENING BREAST TOMO BILATERAL
8 of 13 series · 8 of 29 positions shown · non-contrast
Comparison: Previous exam(s).

CLINICAL DATA: Screening.

EXAM:
DIGITAL SCREENING BILATERAL MAMMOGRAM WITH 3D TOMO WITH CAD

[L MLO]
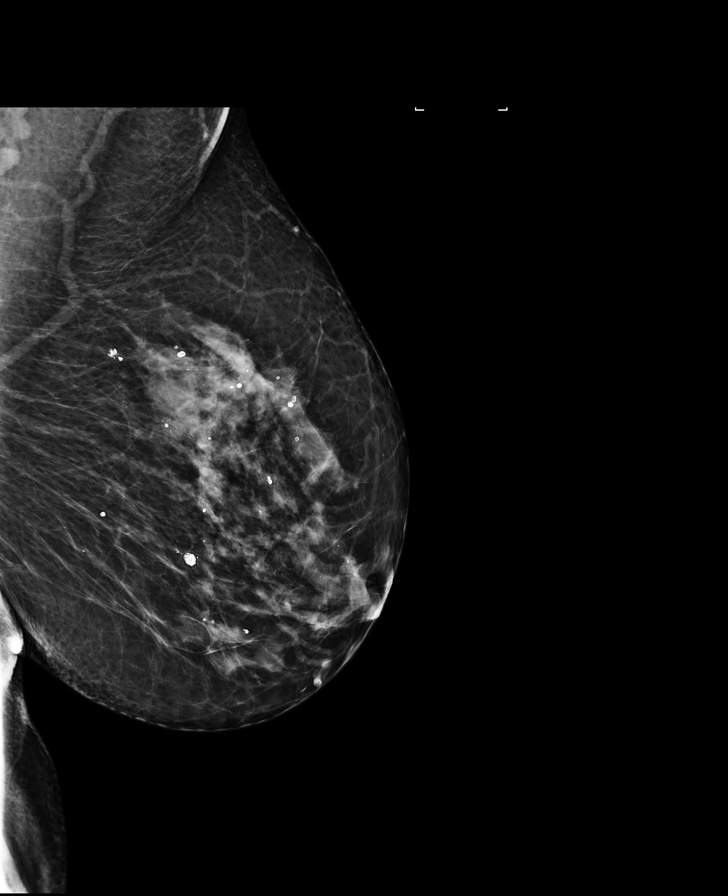

[L CC]
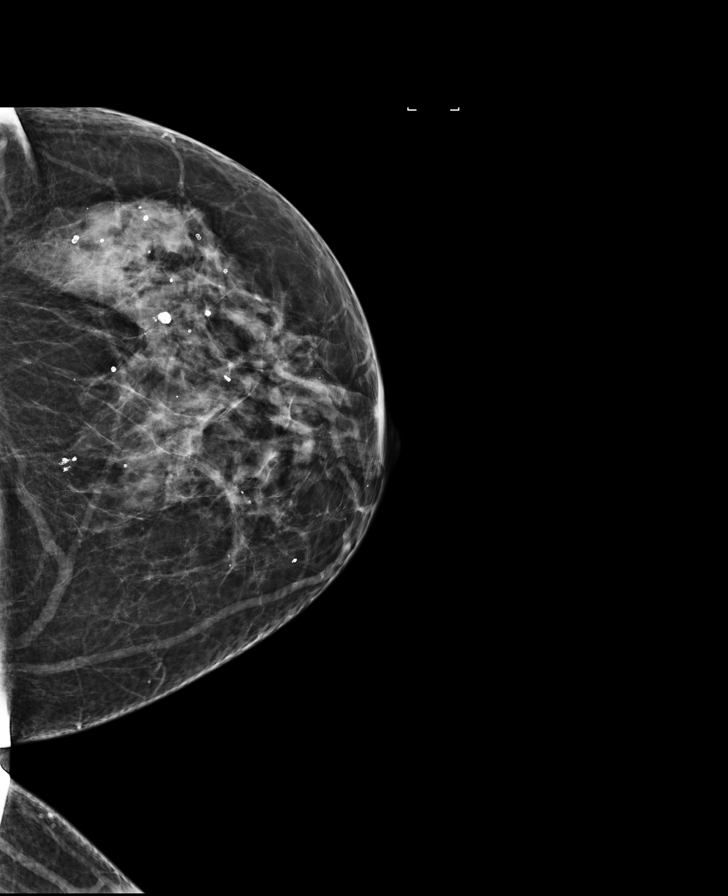

[R MLO synth-2D]
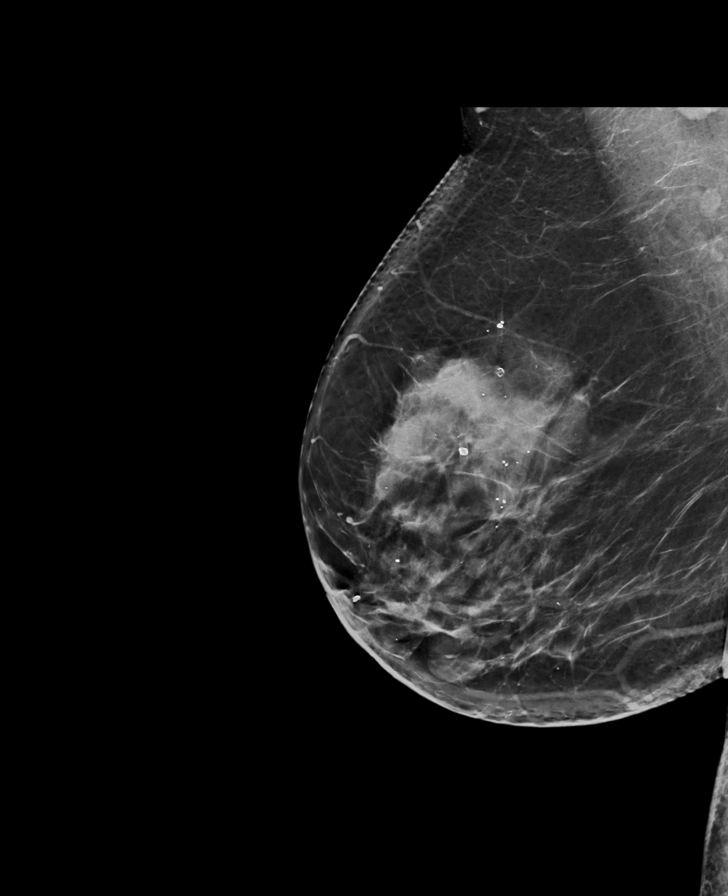

[L CC synth-2D]
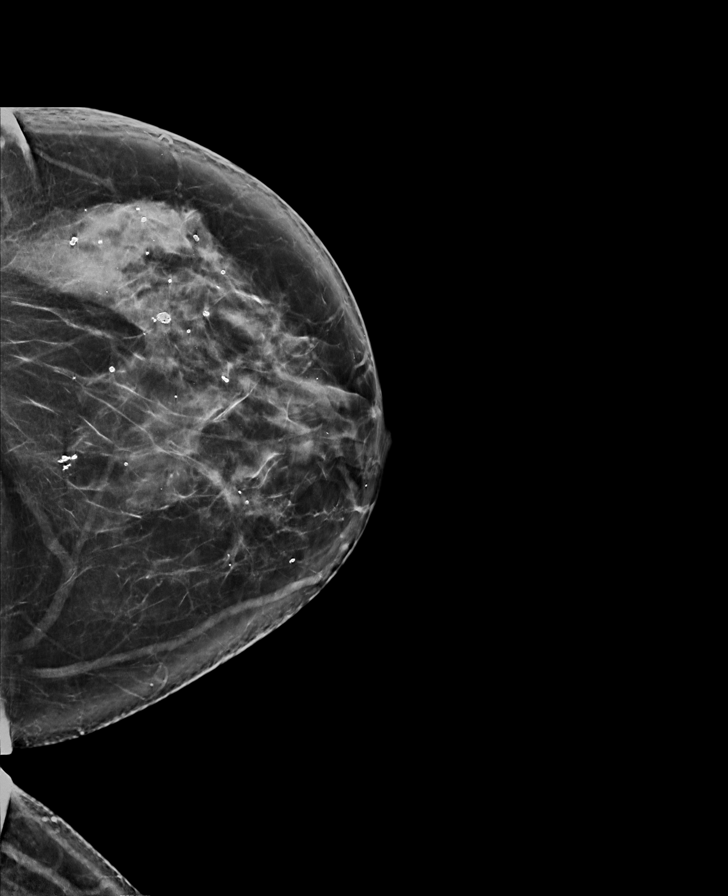

[R CC]
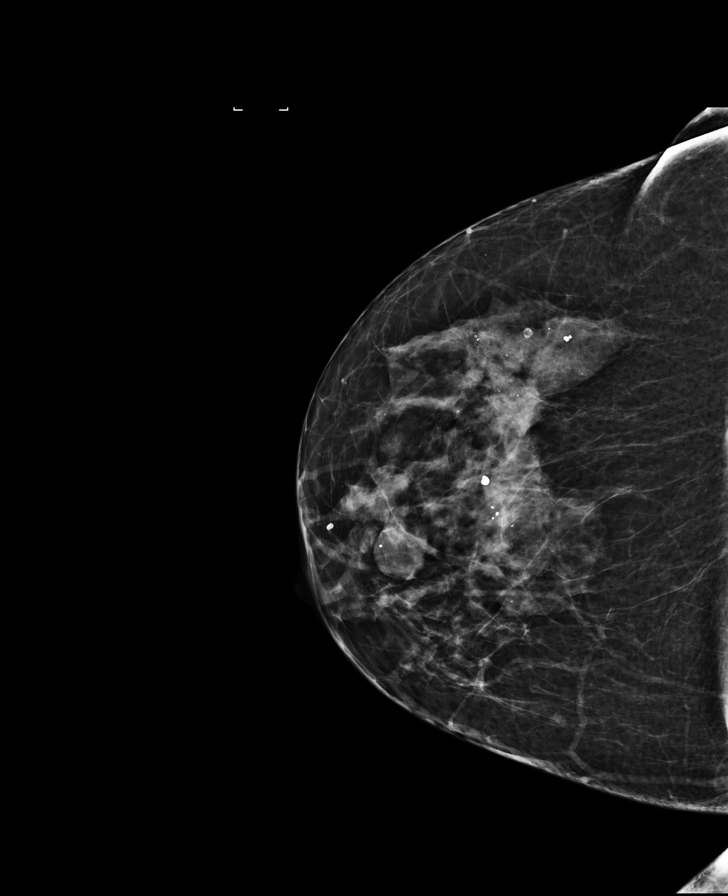

[R MLO]
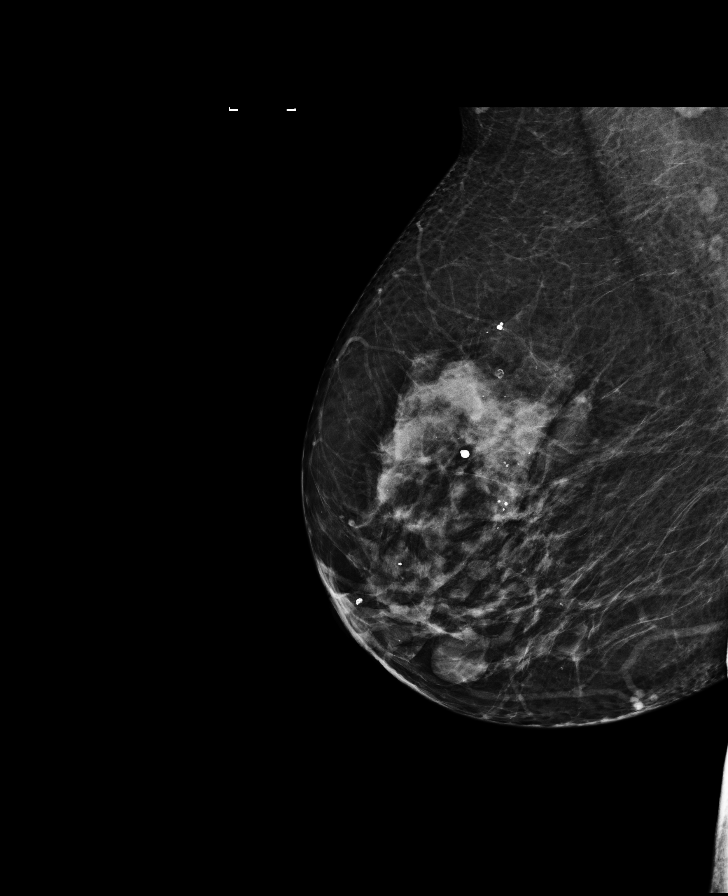

[R CC synth-2D]
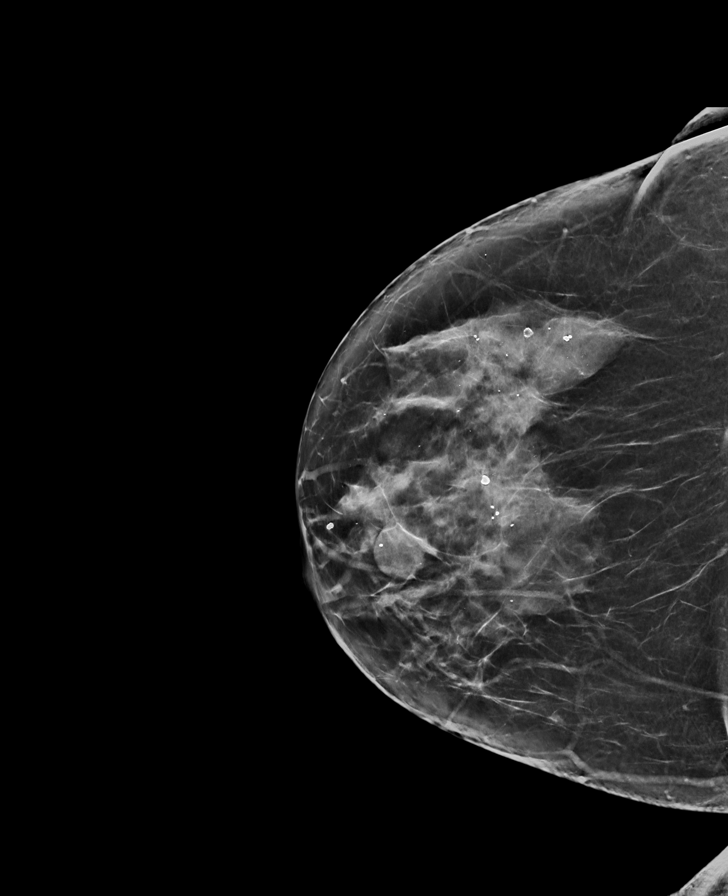

[L MLO synth-2D]
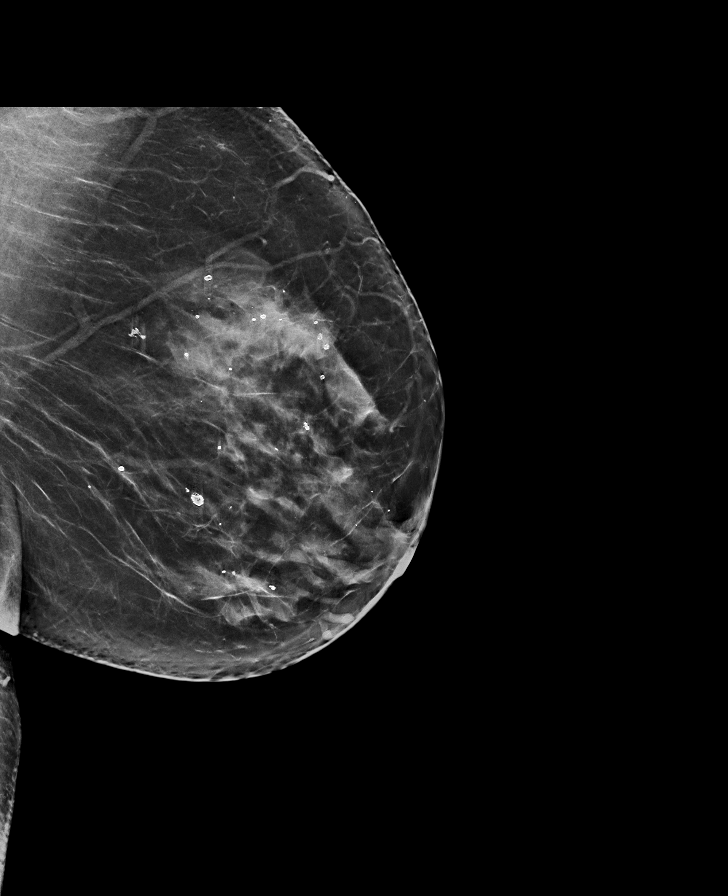

[8 of 29 positions shown; findings below may reference images not displayed]

ACR Breast Density Category c: The breast tissue is heterogeneously
dense, which may obscure small masses.
FINDINGS: There are no findings suspicious for malignancy. Images were
processed with CAD.
IMPRESSION: No mammographic evidence of malignancy. A result letter of this
screening mammogram will be mailed directly to the patient.

RECOMMENDATION:
Screening mammogram in one year. (Code:OA-G-1SS)

BI-RADS CATEGORY  1: Negative.

## 2017-12-16 DIAGNOSIS — H5213 Myopia, bilateral: Secondary | ICD-10-CM | POA: Diagnosis not present

## 2018-02-01 ENCOUNTER — Other Ambulatory Visit (INDEPENDENT_AMBULATORY_CARE_PROVIDER_SITE_OTHER): Payer: 59

## 2018-02-01 DIAGNOSIS — R7989 Other specified abnormal findings of blood chemistry: Secondary | ICD-10-CM

## 2018-02-01 DIAGNOSIS — I10 Essential (primary) hypertension: Secondary | ICD-10-CM

## 2018-02-01 DIAGNOSIS — R739 Hyperglycemia, unspecified: Secondary | ICD-10-CM

## 2018-02-01 DIAGNOSIS — E7800 Pure hypercholesterolemia, unspecified: Secondary | ICD-10-CM | POA: Diagnosis not present

## 2018-02-01 DIAGNOSIS — R945 Abnormal results of liver function studies: Secondary | ICD-10-CM

## 2018-02-01 LAB — CBC WITH DIFFERENTIAL/PLATELET
BASOS PCT: 1 % (ref 0.0–3.0)
Basophils Absolute: 0.1 10*3/uL (ref 0.0–0.1)
EOS PCT: 3.8 % (ref 0.0–5.0)
Eosinophils Absolute: 0.3 10*3/uL (ref 0.0–0.7)
HEMATOCRIT: 41.3 % (ref 36.0–46.0)
Hemoglobin: 14 g/dL (ref 12.0–15.0)
LYMPHS PCT: 34.5 % (ref 12.0–46.0)
Lymphs Abs: 2.3 10*3/uL (ref 0.7–4.0)
MCHC: 34 g/dL (ref 30.0–36.0)
MCV: 87.3 fl (ref 78.0–100.0)
MONOS PCT: 9.1 % (ref 3.0–12.0)
Monocytes Absolute: 0.6 10*3/uL (ref 0.1–1.0)
Neutro Abs: 3.5 10*3/uL (ref 1.4–7.7)
Neutrophils Relative %: 51.6 % (ref 43.0–77.0)
Platelets: 257 10*3/uL (ref 150.0–400.0)
RBC: 4.73 Mil/uL (ref 3.87–5.11)
RDW: 13.9 % (ref 11.5–15.5)
WBC: 6.7 10*3/uL (ref 4.0–10.5)

## 2018-02-01 LAB — HEPATIC FUNCTION PANEL
ALK PHOS: 60 U/L (ref 39–117)
ALT: 73 U/L — ABNORMAL HIGH (ref 0–35)
AST: 35 U/L (ref 0–37)
Albumin: 4.6 g/dL (ref 3.5–5.2)
BILIRUBIN DIRECT: 0.1 mg/dL (ref 0.0–0.3)
Total Bilirubin: 0.5 mg/dL (ref 0.2–1.2)
Total Protein: 7.4 g/dL (ref 6.0–8.3)

## 2018-02-01 LAB — BASIC METABOLIC PANEL
BUN: 17 mg/dL (ref 6–23)
CALCIUM: 9.9 mg/dL (ref 8.4–10.5)
CHLORIDE: 103 meq/L (ref 96–112)
CO2: 27 meq/L (ref 19–32)
Creatinine, Ser: 0.83 mg/dL (ref 0.40–1.20)
GFR: 74.15 mL/min (ref >60.00)
GLUCOSE: 131 mg/dL — AB (ref 70–99)
Potassium: 3.8 mEq/L (ref 3.5–5.1)
SODIUM: 139 meq/L (ref 135–145)

## 2018-02-01 LAB — LIPID PANEL
CHOL/HDL RATIO: 5
Cholesterol: 182 mg/dL (ref 0–200)
HDL: 37.4 mg/dL — ABNORMAL LOW (ref >39.00)
LDL Cholesterol: 114 mg/dL — ABNORMAL HIGH (ref 0–99)
NONHDL: 144.15
Triglycerides: 152 mg/dL — ABNORMAL HIGH (ref 0.0–149.0)
VLDL: 30.4 mg/dL (ref 0.0–40.0)

## 2018-02-01 LAB — HEMOGLOBIN A1C: Hgb A1c MFr Bld: 6.4 % (ref 4.6–6.5)

## 2018-02-01 LAB — TSH: TSH: 2.54 u[IU]/mL (ref 0.35–4.50)

## 2018-02-02 ENCOUNTER — Encounter: Payer: Self-pay | Admitting: Internal Medicine

## 2018-02-08 ENCOUNTER — Ambulatory Visit: Payer: 59 | Admitting: Internal Medicine

## 2018-02-08 ENCOUNTER — Encounter: Payer: Self-pay | Admitting: Internal Medicine

## 2018-02-08 VITALS — BP 124/88 | HR 86 | Temp 97.9°F | Resp 18 | Wt 199.2 lb

## 2018-02-08 DIAGNOSIS — R7989 Other specified abnormal findings of blood chemistry: Secondary | ICD-10-CM

## 2018-02-08 DIAGNOSIS — Z1231 Encounter for screening mammogram for malignant neoplasm of breast: Secondary | ICD-10-CM | POA: Diagnosis not present

## 2018-02-08 DIAGNOSIS — E7800 Pure hypercholesterolemia, unspecified: Secondary | ICD-10-CM

## 2018-02-08 DIAGNOSIS — Z6836 Body mass index (BMI) 36.0-36.9, adult: Secondary | ICD-10-CM | POA: Diagnosis not present

## 2018-02-08 DIAGNOSIS — R928 Other abnormal and inconclusive findings on diagnostic imaging of breast: Secondary | ICD-10-CM | POA: Diagnosis not present

## 2018-02-08 DIAGNOSIS — R739 Hyperglycemia, unspecified: Secondary | ICD-10-CM

## 2018-02-08 DIAGNOSIS — I10 Essential (primary) hypertension: Secondary | ICD-10-CM | POA: Diagnosis not present

## 2018-02-08 DIAGNOSIS — R945 Abnormal results of liver function studies: Secondary | ICD-10-CM

## 2018-02-08 NOTE — Assessment & Plan Note (Signed)
Cholesterol improved.  Low carb diet and exercise.  Follow lipid panel.

## 2018-02-08 NOTE — Progress Notes (Addendum)
[name], female   DOB: 03-08-1956, 62 y.o.   MRN: [id number]   Subjective:    [name], female    DOB: [birth year], 62 y.o.   MRN: [id number]  HPI  Patient here for a scheduled follow up.   She reports she is doing well.  Feels good.  Work is going well.  Mother has adjusted to the move.  Handling stress.  No chest pain.  No sob.  No acid reflux.  No abdominal pain.  Bowels moving.  Overall she feels she is doing well.  Discussed recent labs.  Discussed diet, exercise and weight loss.      Past Medical History:  Diagnosis Date  . Abnormal liver function tests   . Allergy   . Anemia    H/O  . Arthritis    RA  . Depression   . Fatty liver   . Hypercholesterolemia   . Hypertension   . Migraine headache    better since menopause  . Neuromuscular disorder ([place])   . Recurrent sinusitis    allergies  . Tuberculosis 1980'S   + TB TEST-WAS TREATED   Past Surgical History:  Procedure Laterality Date  . BILATERAL CARPAL TUNNEL RELEASE    . BREAST BIOPSY Left 2004   x 2  . BREAST BIOPSY Left 05/12/2017   FRAGMENTS WITH FEATURES OF AN EARLY INTRADUCTAL PAPILLOMA WITH aprocine changes  . BREAST BIOPSY Left 06/12/2017   Procedure: BREAST BIOPSY;  Surgeon: Robert Bellow, MD;  Location: [facility];  Service: General;  Laterality: Left;  . BREAST DUCTAL SYSTEM EXCISION Left 06/12/2017   Procedure: EXCISION DUCTAL SYSTEM BREAST;  Surgeon: Robert Bellow, MD;  Location: [facility];  Service: General;  Laterality: Left;  . COLONOSCOPY WITH PROPOFOL N/A 02/11/2016   Procedure: COLONOSCOPY WITH PROPOFOL;  Surgeon: Lucilla Lame, MD;  Location: [place];  Service: Endoscopy;  Laterality: N/A;  . 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 FRACTURE SURGERY  2017  . OSTECTOMY Left 06/01/2015   Procedure: OSTECTOMY/  part. excision fracture fragment left 4th toe;  Surgeon: Sharlotte Alamo, DPM;  Location: [facility];  Service: Podiatry;  Laterality: Left;  . POLYPECTOMY N/A 02/11/2016   Procedure: POLYPECTOMY;  Surgeon: Lucilla Lame, MD;  Location: [place];  Service: Endoscopy;  Laterality: N/A;  . RHINOPLASTY    . TUBAL LIGATION  1997   Family History  Problem Relation Age of Onset  . Heart disease Father        died age 61 (MI)  . Heart disease Unknown        4 uncles - CABG  . Colon cancer Unknown        great aunt  . Heart disease Paternal Grandmother        first MI - 58s  . Migraines Mother   . Breast cancer Mother 88  . Hypertension Mother   . Hypertension Maternal Grandmother   . Early death Maternal Grandmother   . Diabetes Paternal Grandfather   . Stroke Paternal Grandfather   . Depression Maternal Aunt   . Breast cancer Maternal Aunt   . Breast cancer Paternal Aunt        2 aunts   Social History   Socioeconomic History  . Marital status: Married    Spouse name: Not on  file  . Number of children: 2  . Years of education: Not on file  . Highest education level: Not on file  Occupational History  . Not on file  Social Needs  . Financial resource strain: Not on file  . Food insecurity:    Worry: Not on file    Inability: Not on file  . Transportation needs:    Medical: Not on file    Non-medical: Not on file  Tobacco Use  . Smoking status: Never Smoker  . Smokeless tobacco: Never Used  Substance and Sexual Activity  . Alcohol use: Yes    Alcohol/week: 1.0 standard drinks    Types: 1 Cans of beer per week    Comment: occasional  . Drug use: No  . Sexual activity: Yes    Birth control/protection: Post-menopausal, Surgical  Lifestyle  . Physical activity:    Days per week: Not on file    Minutes per session: Not on file  . Stress: Not on file  Relationships  . Social connections:    Talks on phone: Not on file    Gets together: Not on file    Attends  religious service: Not on file    Active member of club or organization: Not on file    Attends meetings of clubs or organizations: Not on file    Relationship status: Not on file  Other Topics Concern  . Not on file  Social History Narrative  . Not on file    Outpatient Encounter Medications as of 02/08/2018  Medication Sig  . acetaminophen (TYLENOL) 500 MG tablet Take 1,000 mg by mouth daily as needed for moderate pain.   Marland Kitchen amLODipine (NORVASC) 10 MG tablet TAKE 1 TABLET BY MOUTH DAILY  . fexofenadine-pseudoephedrine (ALLEGRA-D) 60-120 MG 12 hr tablet Take 1 tablet by mouth daily as needed (allergies).   . fluticasone (FLONASE) 50 MCG/ACT nasal spray Place 2 sprays into the nose daily as needed for allergies.   . hydrochlorothiazide (HYDRODIURIL) 25 MG tablet TAKE 1 TABLET BY MOUTH DAILY  . ibuprofen (ADVIL,MOTRIN) 200 MG tablet Take 600-800 mg by mouth daily as needed (arthritis).   Marland Kitchen lisinopril (PRINIVIL,ZESTRIL) 40 MG tablet TAKE 1 TABLET BY MOUTH DAILY  . loratadine (CLARITIN) 10 MG tablet Take 10 mg by mouth daily.   . naproxen sodium (ANAPROX) 220 MG tablet Take 440 mg by mouth daily as needed (pain).   Marland Kitchen PARoxetine (PAXIL) 10 MG tablet TAKE 1 TABLET BY MOUTH EVERY MORNING  . [DISCONTINUED] HYDROcodone-acetaminophen (NORCO/VICODIN) 5-325 MG tablet Take 1 tablet by mouth every 4 (four) hours as needed for moderate pain.   No facility-administered encounter medications on file as of 02/08/2018.     Review of Systems  Constitutional: Negative for appetite change and unexpected weight change.  HENT: Negative for congestion and sinus pressure.   Respiratory: Negative for cough, chest tightness and shortness of breath.   Cardiovascular: Negative for chest pain, palpitations and leg swelling.  Gastrointestinal: Negative for abdominal pain, diarrhea, nausea and vomiting.  Genitourinary: Negative for difficulty urinating and dysuria.  Musculoskeletal: Negative for joint swelling and  myalgias.  Skin: Negative for color change and rash.  Neurological: Negative for dizziness, light-headedness and headaches.  Psychiatric/Behavioral: Negative for agitation and dysphoric mood.       Objective:    Physical Exam  Constitutional: She appears well-developed and well-nourished. No distress.  HENT:  Nose: Nose normal.  Mouth/Throat: Oropharynx is clear and moist.  Neck: Neck supple. No thyromegaly present.  Cardiovascular: Normal rate and regular rhythm.  Pulmonary/Chest: Breath sounds normal. No respiratory distress. She has no wheezes.  Abdominal: Soft. Bowel sounds are normal. There is no tenderness.  Musculoskeletal: She exhibits no edema or tenderness.  Lymphadenopathy:    She has no cervical adenopathy.  Skin: No rash noted. No erythema.  Psychiatric: She has a normal mood and affect. Her behavior is normal.    BP 124/88 (BP Location: Left Arm, Patient Position: Sitting, Cuff Size: Normal)   Pulse 86   Temp 97.9 F (36.6 C) (Oral)   Resp 18   Wt 199 lb 3.2 oz (90.4 kg)   LMP 05/30/2007   SpO2 98%   BMI 36.43 kg/m  Wt Readings from Last 3 Encounters:  02/08/18 199 lb 3.2 oz (90.4 kg)  10/05/17 193 lb 2 oz (87.6 kg)  06/03/17 195 lb (88.5 kg)     Lab Results  Component Value Date   WBC 6.7 02/01/2018   HGB 14.0 02/01/2018   HCT 41.3 02/01/2018   PLT 257.0 02/01/2018   GLUCOSE 131 (H) 02/01/2018   CHOL 182 02/01/2018   TRIG 152.0 (H) 02/01/2018   HDL 37.40 (L) 02/01/2018   LDLDIRECT 133.0 10/05/2017   LDLCALC 114 (H) 02/01/2018   ALT 73 (H) 02/01/2018   AST 35 02/01/2018   NA 139 02/01/2018   K 3.8 02/01/2018   CL 103 02/01/2018   CREATININE 0.83 02/01/2018   BUN 17 02/01/2018   CO2 27 02/01/2018   TSH 2.54 02/01/2018   HGBA1C 6.4 02/01/2018   MICROALBUR 3.0 (H) 12/11/2014       Assessment & Plan:   Problem List Items Addressed This Visit    Abnormal liver function tests    Previous ultrasound - fatty liver.  Previously worked up by  GI.  Discussed diet, exercise and weight loss.  Follow liver function tests.        Abnormal mammogram    Previous abnormal mammogram s/p biopsy.  Saw Dr Bary Castilla.  Ordered f/u mammogram.  She wants to schedule.  Discussed f/u with Dr Bary Castilla.  She wants to hold.  Wants to see what mammogram shows and then will decide further w/up.       BMI 36.0-36.9,adult    Discussed diet, exercise and weight loss.        Hypercholesterolemia    Cholesterol improved.  Low carb diet and exercise.  Follow lipid panel.        Relevant Orders   Hepatic function panel   Lipid panel   Hyperglycemia    Low carb diet, exercise and weight loss.  Follow met b and a1c.       Relevant Orders   Hemoglobin A1c   Hypertension    Blood pressure has been well controlled.  Recheck improved.  Continue current medication regimen.  Follow pressures.  Follow metabolic panel.        Relevant Orders   Basic metabolic panel    Other Visit Diagnoses    Visit for screening mammogram    -  Primary   Relevant Orders   MM 3D SCREEN BREAST BILATERAL       Einar Pheasant, MD

## 2018-02-13 ENCOUNTER — Encounter: Payer: Self-pay | Admitting: Internal Medicine

## 2018-02-13 NOTE — Assessment & Plan Note (Signed)
Blood pressure has been well controlled.  Recheck improved.  Continue current medication regimen.  Follow pressures.  Follow metabolic panel.

## 2018-02-13 NOTE — Assessment & Plan Note (Signed)
Previous ultrasound - fatty liver.  Previously worked up by GI.  Discussed diet, exercise and weight loss.  Follow liver function tests.

## 2018-02-13 NOTE — Assessment & Plan Note (Signed)
Low carb diet, exercise and weight loss.  Follow met b and a1c.

## 2018-02-13 NOTE — Assessment & Plan Note (Signed)
Discussed diet, exercise and weight loss

## 2018-02-22 DIAGNOSIS — R928 Other abnormal and inconclusive findings on diagnostic imaging of breast: Secondary | ICD-10-CM | POA: Insufficient documentation

## 2018-02-22 NOTE — Assessment & Plan Note (Signed)
Previous abnormal mammogram s/p biopsy.  Saw Dr Bary Castilla.  Ordered f/u mammogram.  She wants to schedule.  Discussed f/u with Dr Bary Castilla.  She wants to hold.  Wants to see what mammogram shows and then will decide further w/up.

## 2018-02-26 ENCOUNTER — Other Ambulatory Visit: Payer: Self-pay | Admitting: Internal Medicine

## 2018-03-05 ENCOUNTER — Encounter: Payer: Self-pay | Admitting: Internal Medicine

## 2018-03-05 ENCOUNTER — Telehealth: Payer: Self-pay | Admitting: Internal Medicine

## 2018-03-05 NOTE — Telephone Encounter (Signed)
-----   Message from Renda Rolls, [facility] sent at 03/05/2018 11:20 AM EST ----- Regarding: Shingrix Vaccine 1st Dose of Shingrix given 03/05/18 and charted in [facility].

## 2018-03-25 ENCOUNTER — Other Ambulatory Visit: Payer: Self-pay | Admitting: Internal Medicine

## 2018-04-07 ENCOUNTER — Ambulatory Visit
Admission: RE | Admit: 2018-04-07 | Discharge: 2018-04-07 | Disposition: A | Discharge status: Home / Self Care | Payer: 59 | Source: Ambulatory Visit | Attending: Internal Medicine | Admitting: Internal Medicine

## 2018-04-07 DIAGNOSIS — Z1231 Encounter for screening mammogram for malignant neoplasm of breast: Secondary | ICD-10-CM | POA: Diagnosis not present

## 2018-04-12 ENCOUNTER — Other Ambulatory Visit: Payer: Self-pay | Admitting: Internal Medicine

## 2018-06-07 ENCOUNTER — Other Ambulatory Visit (INDEPENDENT_AMBULATORY_CARE_PROVIDER_SITE_OTHER): Payer: 59

## 2018-06-07 DIAGNOSIS — E7800 Pure hypercholesterolemia, unspecified: Secondary | ICD-10-CM

## 2018-06-07 DIAGNOSIS — R739 Hyperglycemia, unspecified: Secondary | ICD-10-CM

## 2018-06-07 DIAGNOSIS — I10 Essential (primary) hypertension: Secondary | ICD-10-CM

## 2018-06-07 LAB — LIPID PANEL
Cholesterol: 211 mg/dL — ABNORMAL HIGH (ref 0–200)
HDL: 36 mg/dL — ABNORMAL LOW (ref >39.00)
LDL Cholesterol: 146 mg/dL — ABNORMAL HIGH (ref 0–99)
NonHDL: 174.6
TRIGLYCERIDES: 144 mg/dL (ref 0.0–149.0)
Total CHOL/HDL Ratio: 6
VLDL: 28.8 mg/dL (ref 0.0–40.0)

## 2018-06-07 LAB — HEPATIC FUNCTION PANEL
ALT: 78 U/L — ABNORMAL HIGH (ref 0–35)
AST: 40 U/L — ABNORMAL HIGH (ref 0–37)
Albumin: 4.5 g/dL (ref 3.5–5.2)
Alkaline Phosphatase: 59 U/L (ref 39–117)
Bilirubin, Direct: 0.1 mg/dL (ref 0.0–0.3)
Total Bilirubin: 0.5 mg/dL (ref 0.2–1.2)
Total Protein: 7.1 g/dL (ref 6.0–8.3)

## 2018-06-07 LAB — BASIC METABOLIC PANEL
BUN: 21 mg/dL (ref 6–23)
CO2: 28 mEq/L (ref 19–32)
Calcium: 9.7 mg/dL (ref 8.4–10.5)
Chloride: 102 mEq/L (ref 96–112)
Creatinine, Ser: 0.94 mg/dL (ref 0.40–1.20)
GFR: 60.37 mL/min (ref >60.00)
Glucose, Bld: 125 mg/dL — ABNORMAL HIGH (ref 70–99)
Potassium: 3.8 mEq/L (ref 3.5–5.1)
Sodium: 139 mEq/L (ref 135–145)

## 2018-06-07 LAB — HEMOGLOBIN A1C: HEMOGLOBIN A1C: 6.7 % — AB (ref 4.6–6.5)

## 2018-06-09 ENCOUNTER — Encounter: Payer: Self-pay | Admitting: Internal Medicine

## 2018-06-14 ENCOUNTER — Other Ambulatory Visit: Payer: Self-pay

## 2018-06-14 ENCOUNTER — Encounter: Payer: Self-pay | Admitting: Internal Medicine

## 2018-06-14 ENCOUNTER — Ambulatory Visit (INDEPENDENT_AMBULATORY_CARE_PROVIDER_SITE_OTHER): Payer: 59 | Admitting: Internal Medicine

## 2018-06-14 ENCOUNTER — Other Ambulatory Visit (HOSPITAL_COMMUNITY)
Admission: RE | Admit: 2018-06-14 | Discharge: 2018-06-14 | Disposition: A | Discharge status: Home / Self Care | Payer: 59 | Source: Ambulatory Visit | Attending: Internal Medicine | Admitting: Internal Medicine

## 2018-06-14 VITALS — BP 118/70 | HR 81 | Temp 98.0°F | Resp 16 | Ht 62.0 in | Wt 203.0 lb

## 2018-06-14 DIAGNOSIS — I10 Essential (primary) hypertension: Secondary | ICD-10-CM | POA: Diagnosis not present

## 2018-06-14 DIAGNOSIS — E7800 Pure hypercholesterolemia, unspecified: Secondary | ICD-10-CM | POA: Diagnosis not present

## 2018-06-14 DIAGNOSIS — Z6837 Body mass index (BMI) 37.0-37.9, adult: Secondary | ICD-10-CM | POA: Diagnosis not present

## 2018-06-14 DIAGNOSIS — R739 Hyperglycemia, unspecified: Secondary | ICD-10-CM

## 2018-06-14 DIAGNOSIS — Z124 Encounter for screening for malignant neoplasm of cervix: Secondary | ICD-10-CM | POA: Diagnosis not present

## 2018-06-14 DIAGNOSIS — R7989 Other specified abnormal findings of blood chemistry: Secondary | ICD-10-CM

## 2018-06-14 DIAGNOSIS — R945 Abnormal results of liver function studies: Secondary | ICD-10-CM | POA: Diagnosis not present

## 2018-06-14 DIAGNOSIS — Z0000 Encounter for general adult medical examination without abnormal findings: Secondary | ICD-10-CM | POA: Diagnosis not present

## 2018-06-14 NOTE — Progress Notes (Signed)
Patient ID: MAEGAN BULLER, female   DOB: [birth year], 63 y.o.   MRN: [id number]   Subjective:    Patient ID: [name], female    DOB: 1956-03-27, 63 y.o.   MRN: [id number]  HPI  Patient here for her physical exam.  States she is doing well.  Feels good.  Plans to start exercising more.  Has been exercising some.  No chest pain.  No sob.  No acid reflux.  No abdominal pain.  Bowels moving.  No urine change.  Discussed labs.  Discussed liver function tests and importance of diet, exercise and weight loss.  Blood pressure has been doing well. Handling stress.     Past Medical History:  Diagnosis Date  . Abnormal liver function tests   . Allergy   . Anemia    H/O  . Arthritis    RA  . Depression   . Fatty liver   . Hypercholesterolemia   . Hypertension   . Migraine headache    better since menopause  . Neuromuscular disorder ([place])   . Recurrent sinusitis    allergies  . Tuberculosis 1980'S   + TB TEST-WAS TREATED   Past Surgical History:  Procedure Laterality Date  . BILATERAL CARPAL TUNNEL RELEASE    . BREAST BIOPSY Left 2004   x 2  . BREAST BIOPSY Left 05/12/2017   FRAGMENTS WITH FEATURES OF AN EARLY INTRADUCTAL PAPILLOMA WITH aprocine changes  . BREAST BIOPSY Left 06/12/2017   Procedure: BREAST BIOPSY;  Surgeon: Robert Bellow, MD;  Location: [facility];  Service: General;  Laterality: Left;  . BREAST DUCTAL SYSTEM EXCISION Left 06/12/2017   Procedure: EXCISION DUCTAL SYSTEM BREAST;  Surgeon: Robert Bellow, MD;  Location: [facility];  Service: General;  Laterality: Left;  . COLONOSCOPY WITH PROPOFOL N/A 02/11/2016   Procedure: COLONOSCOPY WITH PROPOFOL;  Surgeon: Lucilla Lame, MD;  Location: [place];  Service: Endoscopy;  Laterality: N/A;  . 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 FRACTURE SURGERY  2017  .  OSTECTOMY Left 06/01/2015   Procedure: OSTECTOMY/ part. excision fracture fragment left 4th toe;  Surgeon: Sharlotte Alamo, DPM;  Location: [facility];  Service: Podiatry;  Laterality: Left;  . POLYPECTOMY N/A 02/11/2016   Procedure: POLYPECTOMY;  Surgeon: Lucilla Lame, MD;  Location: [place];  Service: Endoscopy;  Laterality: N/A;  . RHINOPLASTY    . TUBAL LIGATION  1997   Family History  Problem Relation Age of Onset  . Heart disease Father        died age 14 (MI)  . Heart disease Other        4 uncles - CABG  . Colon cancer Other        great aunt  . Heart disease Paternal Grandmother        first MI - 44s  . Migraines Mother   . Breast cancer Mother 74  . Hypertension Mother   . Hypertension Maternal Grandmother   . Early death Maternal Grandmother   . Diabetes Paternal Grandfather   . Stroke Paternal Grandfather   . Depression Maternal Aunt   . Breast cancer Maternal Aunt   . Breast cancer Paternal Aunt        2 aunts   Social History   Socioeconomic History  . Marital status: Married    Spouse name:  Not on file  . Number of children: 2  . Years of education: Not on file  . Highest education level: Not on file  Occupational History  . Not on file  Social Needs  . Financial resource strain: Not on file  . Food insecurity:    Worry: Not on file    Inability: Not on file  . Transportation needs:    Medical: Not on file    Non-medical: Not on file  Tobacco Use  . Smoking status: Never Smoker  . Smokeless tobacco: Never Used  Substance and Sexual Activity  . Alcohol use: Yes    Alcohol/week: 1.0 standard drinks    Types: 1 Cans of beer per week    Comment: occasional  . Drug use: No  . Sexual activity: Yes    Birth control/protection: Post-menopausal, Surgical  Lifestyle  . Physical activity:    Days per week: Not on file    Minutes per session: Not on file  . Stress: Not on file  Relationships  . Social connections:    Talks on phone: Not on file     Gets together: Not on file    Attends religious service: Not on file    Active member of club or organization: Not on file    Attends meetings of clubs or organizations: Not on file    Relationship status: Not on file  Other Topics Concern  . Not on file  Social History Narrative  . Not on file    Outpatient Encounter Medications as of 06/14/2018  Medication Sig  . acetaminophen (TYLENOL) 500 MG tablet Take 1,000 mg by mouth daily as needed for moderate pain.   Marland Kitchen amLODipine (NORVASC) 10 MG tablet TAKE 1 TABLET BY MOUTH DAILY  . fexofenadine-pseudoephedrine (ALLEGRA-D) 60-120 MG 12 hr tablet Take 1 tablet by mouth daily as needed (allergies).   . fluticasone (FLONASE) 50 MCG/ACT nasal spray Place 2 sprays into the nose daily as needed for allergies.   . hydrochlorothiazide (HYDRODIURIL) 25 MG tablet TAKE 1 TABLET BY MOUTH DAILY  . ibuprofen (ADVIL,MOTRIN) 200 MG tablet Take 600-800 mg by mouth daily as needed (arthritis).   Marland Kitchen lisinopril (PRINIVIL,ZESTRIL) 40 MG tablet TAKE 1 TABLET BY MOUTH DAILY  . loratadine (CLARITIN) 10 MG tablet Take 10 mg by mouth daily.   . naproxen sodium (ANAPROX) 220 MG tablet Take 440 mg by mouth daily as needed (pain).   Marland Kitchen PARoxetine (PAXIL) 10 MG tablet TAKE 1 TABLET BY MOUTH EVERY MORNING   No facility-administered encounter medications on file as of 06/14/2018.     Review of Systems  Constitutional: Negative for appetite change and unexpected weight change.  HENT: Negative for congestion and sinus pressure.   Eyes: Negative for pain and visual disturbance.  Respiratory: Negative for cough, chest tightness and shortness of breath.   Cardiovascular: Negative for chest pain, palpitations and leg swelling.  Gastrointestinal: Negative for abdominal pain, diarrhea, nausea and vomiting.  Genitourinary: Negative for difficulty urinating and dysuria.  Musculoskeletal: Negative for joint swelling and myalgias.  Skin: Negative for color change and rash.   Neurological: Negative for dizziness, light-headedness and headaches.  Hematological: Negative for adenopathy. Does not bruise/bleed easily.  Psychiatric/Behavioral: Negative for agitation and dysphoric mood.       Objective:    Physical Exam Constitutional:      General: She is not in acute distress.    Appearance: Normal appearance. She is well-developed.  HENT:     Nose: Nose normal. No  congestion.     Mouth/Throat:     Pharynx: No oropharyngeal exudate or posterior oropharyngeal erythema.  Eyes:     General: No scleral icterus.       Right eye: No discharge.        Left eye: No discharge.  Neck:     Musculoskeletal: Neck supple. No muscular tenderness.     Thyroid: No thyromegaly.  Cardiovascular:     Rate and Rhythm: Normal rate and regular rhythm.  Pulmonary:     Effort: No tachypnea, accessory muscle usage or respiratory distress.     Breath sounds: Normal breath sounds. No decreased breath sounds or wheezing.  Chest:     Breasts:        Right: No inverted nipple, mass, nipple discharge or tenderness (no axillary adenopathy).        Left: No inverted nipple, mass, nipple discharge or tenderness (no axilarry adenopathy).  Abdominal:     General: Bowel sounds are normal.     Palpations: Abdomen is soft.     Tenderness: There is no abdominal tenderness.  Genitourinary:    Comments: Normal external genitalia.  Vaginal vault without lesions.  Cervix identified.  Pap smear performed.  Could not appreciate any adnexal masses or tenderness.   Musculoskeletal:        General: No swelling or tenderness.  Lymphadenopathy:     Cervical: No cervical adenopathy.  Skin:    Findings: No erythema or rash.  Neurological:     Mental Status: She is alert and oriented to person, place, and time.  Psychiatric:        Mood and Affect: Mood normal.        Behavior: Behavior normal.     BP 118/70   Pulse 81   Temp 98 F (36.7 C) (Oral)   Resp 16   Ht _0  (1.575 m)   Wt 203  lb (92.1 kg)   LMP 05/30/2007   SpO2 97%   BMI 37.13 kg/m  Wt Readings from Last 3 Encounters:  06/14/18 203 lb (92.1 kg)  02/08/18 199 lb 3.2 oz (90.4 kg)  10/05/17 193 lb 2 oz (87.6 kg)     Lab Results  Component Value Date   WBC 6.7 02/01/2018   HGB 14.0 02/01/2018   HCT 41.3 02/01/2018   PLT 257.0 02/01/2018   GLUCOSE 125 (H) 06/07/2018   CHOL 211 (H) 06/07/2018   TRIG 144.0 06/07/2018   HDL 36.00 (L) 06/07/2018   LDLDIRECT 133.0 10/05/2017   LDLCALC 146 (H) 06/07/2018   ALT 78 (H) 06/07/2018   AST 40 (H) 06/07/2018   NA 139 06/07/2018   K 3.8 06/07/2018   CL 102 06/07/2018   CREATININE 0.94 06/07/2018   BUN 21 06/07/2018   CO2 28 06/07/2018   TSH 2.54 02/01/2018   HGBA1C 6.7 (H) 06/07/2018   MICROALBUR 3.0 (H) 12/11/2014    Mm 3d Screen Breast Bilateral  Result Date: 04/07/2018 CLINICAL DATA:  Screening. EXAM: DIGITAL SCREENING BILATERAL MAMMOGRAM WITH TOMO AND CAD COMPARISON:  Previous exam(s). ACR Breast Density Category c: The breast tissue is heterogeneously dense, which may obscure small masses. FINDINGS: There are no findings suspicious for malignancy. Images were processed with CAD. IMPRESSION: No mammographic evidence of malignancy. A result letter of this screening mammogram will be mailed directly to the patient. RECOMMENDATION: Screening mammogram in one year. (Code:SM-B-01Y) BI-RADS CATEGORY  1: Negative. Electronically Signed   By: Nolon Nations M.D.   On: 04/07/2018 10:36  Assessment & Plan:   Problem List Items Addressed This Visit    Abnormal liver function tests    Previous ultrasound - fatty liver.  Previously worked up by GI.  Discussed diet and exercise and weight loss.  Follow liver function tests.  Reports no increased alcohol intake.        Relevant Orders   Hepatic function panel   Adult BMI 37.0-37.9 kg/sq m    Discussed diet and exercise.  Follow.        Health care maintenance    Physical today 06/14/18.  PAP 06/14/18.   Mammogram 04/07/18 - Birads I.  Colonoscopy 01/2016 - recommended f/u in 5 years.        Hypercholesterolemia    Low carb diet and exercise.  Follow lipid panel.        Hyperglycemia    Low carb diet and exercise.  Follow met b and a1c.        Hypertension    Blood pressure under good control.  Continue same medication regimen.  Follow pressures.  Follow metabolic panel.         Other Visit Diagnoses    Routine general medical examination at a health care facility    -  Primary   Cervical cancer screening       Relevant Orders   Cytology - PAP( [place]) (Completed)       Einar Pheasant, MD

## 2018-06-14 NOTE — Assessment & Plan Note (Signed)
Physical today 06/14/18.  PAP 06/14/18.  Mammogram 04/07/18 - Birads I.  Colonoscopy 01/2016 - recommended f/u in 5 years.

## 2018-06-15 ENCOUNTER — Encounter: Payer: Self-pay | Admitting: Internal Medicine

## 2018-06-15 LAB — CYTOLOGY - PAP
Diagnosis: NEGATIVE
HPV: NOT DETECTED

## 2018-06-19 ENCOUNTER — Encounter: Payer: Self-pay | Admitting: Internal Medicine

## 2018-06-19 NOTE — Assessment & Plan Note (Signed)
Low carb diet and exercise.  Follow lipid panel.  

## 2018-06-19 NOTE — Assessment & Plan Note (Signed)
Blood pressure under good control.  Continue same medication regimen.  Follow pressures.  Follow metabolic panel.   

## 2018-06-19 NOTE — Assessment & Plan Note (Signed)
Previous ultrasound - fatty liver.  Previously worked up by GI.  Discussed diet and exercise and weight loss.  Follow liver function tests.  Reports no increased alcohol intake.

## 2018-06-19 NOTE — Assessment & Plan Note (Signed)
Low carb diet and exercise.  Follow met b and a1c.   

## 2018-06-19 NOTE — Assessment & Plan Note (Signed)
Discussed diet and exercise.  Follow.  

## 2018-07-02 ENCOUNTER — Encounter: Payer: Self-pay | Admitting: Internal Medicine

## 2018-07-26 ENCOUNTER — Other Ambulatory Visit (INDEPENDENT_AMBULATORY_CARE_PROVIDER_SITE_OTHER): Payer: 59

## 2018-07-26 ENCOUNTER — Other Ambulatory Visit: Payer: Self-pay

## 2018-07-26 DIAGNOSIS — R945 Abnormal results of liver function studies: Secondary | ICD-10-CM

## 2018-07-26 DIAGNOSIS — R7989 Other specified abnormal findings of blood chemistry: Secondary | ICD-10-CM

## 2018-07-26 LAB — HEPATIC FUNCTION PANEL
ALT: 50 U/L — ABNORMAL HIGH (ref 0–35)
AST: 26 U/L (ref 0–37)
Albumin: 4.4 g/dL (ref 3.5–5.2)
Alkaline Phosphatase: 54 U/L (ref 39–117)
Bilirubin, Direct: 0.1 mg/dL (ref 0.0–0.3)
Total Bilirubin: 0.6 mg/dL (ref 0.2–1.2)
Total Protein: 7.1 g/dL (ref 6.0–8.3)

## 2018-07-27 ENCOUNTER — Encounter: Payer: Self-pay | Admitting: Internal Medicine

## 2018-09-06 ENCOUNTER — Other Ambulatory Visit: Payer: Self-pay | Admitting: Internal Medicine

## 2018-10-04 ENCOUNTER — Other Ambulatory Visit: Payer: Self-pay | Admitting: Internal Medicine

## 2018-10-11 DIAGNOSIS — M25552 Pain in left hip: Secondary | ICD-10-CM | POA: Insufficient documentation

## 2018-10-14 ENCOUNTER — Telehealth: Payer: Self-pay | Admitting: Internal Medicine

## 2018-10-14 NOTE — Telephone Encounter (Signed)
-----   Message from Renda Rolls, [facility] sent at 10/13/2018 12:14 PM EDT ----- Regarding: Shingrix 2nd Shingrix vaccination given 10/12/18 - Series complete.  Charted in [facility].

## 2018-10-20 ENCOUNTER — Other Ambulatory Visit: Payer: Self-pay | Admitting: Internal Medicine

## 2018-11-01 ENCOUNTER — Encounter: Payer: Self-pay | Admitting: Internal Medicine

## 2018-11-01 ENCOUNTER — Ambulatory Visit (INDEPENDENT_AMBULATORY_CARE_PROVIDER_SITE_OTHER): Payer: 59 | Admitting: Internal Medicine

## 2018-11-01 ENCOUNTER — Other Ambulatory Visit: Payer: Self-pay

## 2018-11-01 DIAGNOSIS — I10 Essential (primary) hypertension: Secondary | ICD-10-CM

## 2018-11-01 DIAGNOSIS — M25552 Pain in left hip: Secondary | ICD-10-CM

## 2018-11-01 DIAGNOSIS — R739 Hyperglycemia, unspecified: Secondary | ICD-10-CM

## 2018-11-01 DIAGNOSIS — E7800 Pure hypercholesterolemia, unspecified: Secondary | ICD-10-CM | POA: Diagnosis not present

## 2018-11-01 DIAGNOSIS — R945 Abnormal results of liver function studies: Secondary | ICD-10-CM

## 2018-11-01 DIAGNOSIS — R7989 Other specified abnormal findings of blood chemistry: Secondary | ICD-10-CM

## 2018-11-01 NOTE — Progress Notes (Signed)
[name], female   DOB: Sep 01, 1955, 63 y.o.   MRN: [id number]   Virtual Visit via telephone Note  This visit type was conducted due to national recommendations for restrictions regarding the COVID-19 pandemic (e.g. social distancing).  This format is felt to be most appropriate for this patient at this time.  All issues noted in this document were discussed and addressed.  No physical exam was performed (except for noted visual exam findings with Video Visits).   I connected with Dalbert Batman by a video enabled telemedicine application and verified that I am speaking with the correct person using two identifiers. Location patient: home Location provider: work Persons participating in the virtual visit: patient, provider  I discussed the limitations, risks, security and privacy concerns of performing an evaluation and management service by telephone and the availability of in person appointments. The patient expressed understanding and agreed to proceed.   Reason for visit: scheduled follow up  HPI: She reports she is doing relatively well.  Working.  Handling stress.  Is having increased left hip pain.  Tylenol helps.  Describes pain into left buttock and radiates to front of left thigh.  No numbness or tingling.  Sitting aggravates more.  Discussed physical therapy.  She is in agreement.  No chest pain.  No sob.  No acid reflux.  No abdominal pain.  Bowels moving.     ROS: See pertinent positives and negatives per HPI.  Past Medical History:  Diagnosis Date  . Abnormal liver function tests   . Allergy   . Anemia    H/O  . Arthritis    RA  . Depression   . Fatty liver   . Hypercholesterolemia   . Hypertension   . Migraine headache    better since menopause  . Neuromuscular disorder ([place])   . Recurrent sinusitis    allergies  . Tuberculosis 1980'S   + TB TEST-WAS TREATED    Past Surgical History:  Procedure Laterality Date  . BILATERAL CARPAL TUNNEL  RELEASE    . BREAST BIOPSY Left 2004   x 2  . BREAST BIOPSY Left 05/12/2017   FRAGMENTS WITH FEATURES OF AN EARLY INTRADUCTAL PAPILLOMA WITH aprocine changes  . BREAST BIOPSY Left 06/12/2017   Procedure: BREAST BIOPSY;  Surgeon: Robert Bellow, MD;  Location: [facility];  Service: General;  Laterality: Left;  . BREAST DUCTAL SYSTEM EXCISION Left 06/12/2017   Procedure: EXCISION DUCTAL SYSTEM BREAST;  Surgeon: Robert Bellow, MD;  Location: [facility];  Service: General;  Laterality: Left;  . COLONOSCOPY WITH PROPOFOL N/A 02/11/2016   Procedure: COLONOSCOPY WITH PROPOFOL;  Surgeon: Lucilla Lame, MD;  Location: [place];  Service: Endoscopy;  Laterality: N/A;  . 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 FRACTURE SURGERY  2017  . OSTECTOMY Left 06/01/2015   Procedure: OSTECTOMY/ part. excision fracture fragment left 4th toe;  Surgeon: Sharlotte Alamo, DPM;  Location: [facility];  Service: Podiatry;  Laterality: Left;  . POLYPECTOMY N/A 02/11/2016   Procedure: POLYPECTOMY;  Surgeon: Lucilla Lame, MD;  Location: [place];  Service: Endoscopy;  Laterality: N/A;  . RHINOPLASTY    . TUBAL LIGATION  1997    Family History  Problem Relation Age of Onset  . Heart disease Father        died age 89 (MI)  .  Heart disease Other        4 uncles - CABG  . Colon cancer Other        great aunt  . Heart disease Paternal Grandmother        first MI - 76s  . Migraines Mother   . Breast cancer Mother 83  . Hypertension Mother   . Hypertension Maternal Grandmother   . Early death Maternal Grandmother   . Diabetes Paternal Grandfather   . Stroke Paternal Grandfather   . Depression Maternal Aunt   . Breast cancer Maternal Aunt   . Breast cancer Paternal Aunt        2 aunts    SOCIAL HX: reviewed.    Current Outpatient Medications:  .   acetaminophen (TYLENOL) 500 MG tablet, Take 1,000 mg by mouth daily as needed for moderate pain. , Disp: , Rfl:  .  amLODipine (NORVASC) 10 MG tablet, TAKE 1 TABLET BY MOUTH DAILY, Disp: 90 tablet, Rfl: 1 .  fexofenadine-pseudoephedrine (ALLEGRA-D) 60-120 MG 12 hr tablet, Take 1 tablet by mouth daily as needed (allergies). , Disp: , Rfl:  .  fluticasone (FLONASE) 50 MCG/ACT nasal spray, Place 2 sprays into the nose daily as needed for allergies. , Disp: , Rfl:  .  hydrochlorothiazide (HYDRODIURIL) 25 MG tablet, TAKE 1 TABLET BY MOUTH DAILY, Disp: 90 tablet, Rfl: 1 .  ibuprofen (ADVIL,MOTRIN) 200 MG tablet, Take 600-800 mg by mouth daily as needed (arthritis). , Disp: , Rfl:  .  lisinopril (ZESTRIL) 40 MG tablet, TAKE 1 TABLET BY MOUTH DAILY, Disp: 90 tablet, Rfl: 1 .  loratadine (CLARITIN) 10 MG tablet, Take 10 mg by mouth daily. , Disp: , Rfl:  .  naproxen sodium (ANAPROX) 220 MG tablet, Take 440 mg by mouth daily as needed (pain). , Disp: , Rfl:  .  PARoxetine (PAXIL) 10 MG tablet, TAKE 1 TABLET BY MOUTH EVERY MORNING, Disp: 90 tablet, Rfl: 1  EXAM:  VITALS per patient if applicable:  381/82  GENERAL: alert.  Sounds to be in no acute distress.  Answering questions appropriately.    PSYCH/NEURO: pleasant and cooperative, no obvious depression or anxiety, speech and thought processing grossly intact  ASSESSMENT AND PLAN:  Discussed the following assessment and plan:  Abnormal liver function tests Previous ultrasound - fatty liver.  Previously worked up by GI.  Discussed diet and exercise.  Follow liver function tests.    Hypercholesterolemia Low carb diet and exercise.  Follow lipid panel.    Hyperglycemia Low carb diet and exercise.  Follow met b and a1c.   Hypertension Blood pressure under good control.  Continue current medication regimen.  Follow pressures.  Follow metabolic panel.    Left hip pain Persistent pain from buttock into left thigh.  Refer to physical therapy.       I discussed the assessment and treatment plan with the patient. The patient was provided an opportunity to ask questions and all were answered. The patient agreed with the plan and demonstrated an understanding of the instructions.   The patient was advised to call back or seek an in-person evaluation if the symptoms worsen or if the condition fails to improve as anticipated.  I provided 16 minutes of non-face-to-face time during this encounter.   Einar Pheasant, MD

## 2018-11-05 ENCOUNTER — Telehealth: Payer: Self-pay | Admitting: Internal Medicine

## 2018-11-05 DIAGNOSIS — I10 Essential (primary) hypertension: Secondary | ICD-10-CM

## 2018-11-05 DIAGNOSIS — R945 Abnormal results of liver function studies: Secondary | ICD-10-CM

## 2018-11-05 DIAGNOSIS — R739 Hyperglycemia, unspecified: Secondary | ICD-10-CM

## 2018-11-05 DIAGNOSIS — R7989 Other specified abnormal findings of blood chemistry: Secondary | ICD-10-CM

## 2018-11-05 DIAGNOSIS — E7800 Pure hypercholesterolemia, unspecified: Secondary | ICD-10-CM

## 2018-11-05 NOTE — Telephone Encounter (Signed)
Orders needed please and Thank you! Pt is scheduled for 11/11/2018.

## 2018-11-05 NOTE — Telephone Encounter (Signed)
Pt is coming in for labs on 8/13. Wasn't sure exactly what you wanted to order

## 2018-11-06 ENCOUNTER — Encounter: Payer: Self-pay | Admitting: Internal Medicine

## 2018-11-06 DIAGNOSIS — M25552 Pain in left hip: Secondary | ICD-10-CM | POA: Insufficient documentation

## 2018-11-06 NOTE — Telephone Encounter (Signed)
Orders placed for f/u las.   

## 2018-11-06 NOTE — Assessment & Plan Note (Signed)
Blood pressure under good control.  Continue current medication regimen.  Follow pressures.  Follow metabolic panel.

## 2018-11-06 NOTE — Assessment & Plan Note (Signed)
Low carb diet and exercise.  Follow met b and a1c.  

## 2018-11-06 NOTE — Assessment & Plan Note (Signed)
Persistent pain from buttock into left thigh.  Refer to physical therapy.

## 2018-11-06 NOTE — Assessment & Plan Note (Signed)
Low carb diet and exercise.  Follow lipid panel.  

## 2018-11-06 NOTE — Assessment & Plan Note (Signed)
Previous ultrasound - fatty liver.  Previously worked up by GI.  Discussed diet and exercise.  Follow liver function tests.

## 2018-11-08 ENCOUNTER — Encounter: Payer: Self-pay | Admitting: Internal Medicine

## 2018-11-11 ENCOUNTER — Other Ambulatory Visit (INDEPENDENT_AMBULATORY_CARE_PROVIDER_SITE_OTHER): Payer: 59

## 2018-11-11 ENCOUNTER — Other Ambulatory Visit: Payer: Self-pay

## 2018-11-11 DIAGNOSIS — R739 Hyperglycemia, unspecified: Secondary | ICD-10-CM | POA: Diagnosis not present

## 2018-11-11 DIAGNOSIS — E7800 Pure hypercholesterolemia, unspecified: Secondary | ICD-10-CM | POA: Diagnosis not present

## 2018-11-11 DIAGNOSIS — R945 Abnormal results of liver function studies: Secondary | ICD-10-CM | POA: Diagnosis not present

## 2018-11-11 DIAGNOSIS — R7989 Other specified abnormal findings of blood chemistry: Secondary | ICD-10-CM

## 2018-11-11 DIAGNOSIS — I10 Essential (primary) hypertension: Secondary | ICD-10-CM | POA: Diagnosis not present

## 2018-11-11 LAB — BASIC METABOLIC PANEL
BUN: 15 mg/dL (ref 6–23)
CO2: 26 mEq/L (ref 19–32)
Calcium: 9.6 mg/dL (ref 8.4–10.5)
Chloride: 104 mEq/L (ref 96–112)
Creatinine, Ser: 0.78 mg/dL (ref 0.40–1.20)
GFR: 74.77 mL/min (ref >60.00)
Glucose, Bld: 122 mg/dL — ABNORMAL HIGH (ref 70–99)
Potassium: 4.2 mEq/L (ref 3.5–5.1)
Sodium: 140 mEq/L (ref 135–145)

## 2018-11-11 LAB — HEPATIC FUNCTION PANEL
ALT: 45 U/L — ABNORMAL HIGH (ref 0–35)
AST: 29 U/L (ref 0–37)
Albumin: 4.5 g/dL (ref 3.5–5.2)
Alkaline Phosphatase: 54 U/L (ref 39–117)
Bilirubin, Direct: 0.1 mg/dL (ref 0.0–0.3)
Total Bilirubin: 0.7 mg/dL (ref 0.2–1.2)
Total Protein: 6.8 g/dL (ref 6.0–8.3)

## 2018-11-11 LAB — LIPID PANEL
Cholesterol: 217 mg/dL — ABNORMAL HIGH (ref 0–200)
HDL: 34.8 mg/dL — ABNORMAL LOW (ref >39.00)
NonHDL: 182.15
Total CHOL/HDL Ratio: 6
Triglycerides: 208 mg/dL — ABNORMAL HIGH (ref 0.0–149.0)
VLDL: 41.6 mg/dL — ABNORMAL HIGH (ref 0.0–40.0)

## 2018-11-11 LAB — HEMOGLOBIN A1C: Hgb A1c MFr Bld: 6.8 % — ABNORMAL HIGH (ref 4.6–6.5)

## 2018-11-11 LAB — LDL CHOLESTEROL, DIRECT: Direct LDL: 139 mg/dL

## 2018-11-15 ENCOUNTER — Encounter: Payer: Self-pay | Admitting: Physical Therapy

## 2018-11-15 ENCOUNTER — Other Ambulatory Visit: Payer: Self-pay

## 2018-11-15 ENCOUNTER — Ambulatory Visit: Payer: 59 | Attending: Internal Medicine | Admitting: Physical Therapy

## 2018-11-15 DIAGNOSIS — R278 Other lack of coordination: Secondary | ICD-10-CM | POA: Insufficient documentation

## 2018-11-15 DIAGNOSIS — R2689 Other abnormalities of gait and mobility: Secondary | ICD-10-CM | POA: Insufficient documentation

## 2018-11-15 DIAGNOSIS — M62838 Other muscle spasm: Secondary | ICD-10-CM | POA: Diagnosis not present

## 2018-11-15 DIAGNOSIS — M533 Sacrococcygeal disorders, not elsewhere classified: Secondary | ICD-10-CM | POA: Diagnosis not present

## 2018-11-15 NOTE — Therapy (Signed)
[place] MAIN [facility] SERVICES [address] [place], [place], [address] Phone: [phone]   Fax:  [phone]  Physical Therapy Evaluation  Patient Details  Name: Kathleen Yu MRN: [id number] Date of Birth: 02/18/1956 No data recorded  Encounter Date: 11/15/2018    Past Medical History:  Diagnosis Date  . Abnormal liver function tests   . Allergy   . Anemia    H/O  . Arthritis    RA  . Depression   . Fatty liver   . Hypercholesterolemia   . Hypertension   . Migraine headache    better since menopause  . Neuromuscular disorder ([place])   . Recurrent sinusitis    allergies  . Tuberculosis 1980'S   + TB TEST-WAS TREATED    Past Surgical History:  Procedure Laterality Date  . BILATERAL CARPAL TUNNEL RELEASE    . BREAST BIOPSY Left 2004   x 2  . BREAST BIOPSY Left 05/12/2017   FRAGMENTS WITH FEATURES OF AN EARLY INTRADUCTAL PAPILLOMA WITH aprocine changes  . BREAST BIOPSY Left 06/12/2017   Procedure: BREAST BIOPSY;  Surgeon: Robert Bellow, MD;  Location: [facility];  Service: General;  Laterality: Left;  . BREAST DUCTAL SYSTEM EXCISION Left 06/12/2017   Procedure: EXCISION DUCTAL SYSTEM BREAST;  Surgeon: Robert Bellow, MD;  Location: [facility];  Service: General;  Laterality: Left;  . COLONOSCOPY WITH PROPOFOL N/A 02/11/2016   Procedure: COLONOSCOPY WITH PROPOFOL;  Surgeon: Lucilla Lame, MD;  Location: [place];  Service: Endoscopy;  Laterality: N/A;  . 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 FRACTURE SURGERY  2017  . OSTECTOMY Left 06/01/2015   Procedure: OSTECTOMY/ part. excision fracture fragment left 4th toe;  Surgeon: Sharlotte Alamo, DPM;  Location: [facility];  Service: Podiatry;  Laterality: Left;  . POLYPECTOMY N/A 02/11/2016   Procedure: POLYPECTOMY;  Surgeon: Lucilla Lame,  MD;  Location: [place];  Service: Endoscopy;  Laterality: N/A;  . RHINOPLASTY    . TUBAL LIGATION  1997    There were no vitals filed for this visit.   Subjective Assessment - 11/15/18 0818    Subjective L hip pain started 3 months ago "like someone grabbed my butt cheek and set it on fire". Pain level 9/10.  It occurred when she turned to talk to her husband while walking .  Denied radiating pain.  With Tylenol, it went down to 3-4/10.  When sitting for a long time, it hurts in the top thigh. Pt folds up a pillow case and places it under her L thigh when sitting at work, it feels better from a 9/10 to 2-3/10. Worst after sitting at work. Her chair seat has a slump in it.  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 Pt is on her feet 50% of the time, sitting at different desks and chairs 50% of the time. Hip also hurts when sleeping on L side which pt resorts because she can not breath with draining sinus when sleeping on her R side. Pt sleeps half the time in her recliner due to the L hip pain. Pt likes to line dance once a week and her L hip starts to burn. R hip will hurt as well.    Pertinent History  Fall on her L side on carport/ kitchen steps 3 years ago,  RA, Morton's Neuroma Surgery on L foot, Surgery on L foot 4th toe to remove "bone chip". Pt wore a post op shoe for 6 weeks. Pt does not have LBP.    Patient Stated Goals  to hurt when sitting or sleeping         [facility] PT Assessment - 11/15/18 0858      Observation/Other Assessments   Observations  sits with a folded towel under L posterior thigh to relief L hip pain      Posture/Postural Control   Posture Comments  limited thoracic excursion       AROM   Overall AROM Comments  L hip pain with R trunk rotation ( post Tx: no pain). other ROM [facility]      Strength   Overall Strength Comments  R hip flex 4-/5, L hip flexion 5/5, knee flex/ext B 5/5 , hip abd 4+/5 B       Palpation   Spinal mobility  L scapular down rotaion  initiates first, L lateral shift at T/L junction, L QL more posterior/ increased tightness. L hip pain with R trunk rotation  ( ROM WFL for all 4 directions)      SI assessment   increased L QL tightness     Palpation comment  standing: L iliac higher than R, ( post Tx: levelled)       Bed Mobility   Bed Mobility  --   crunch lift               Objective measurements completed on examination: See above findings.    Pelvic Floor Special Questions - 11/15/18 0858    Diastasis Recti  abdominal bulge       [facility] Adult PT Treatment/Exercise - 11/15/18 6295      Ambulation/Gait   Gait Comments  L hip higher ( pre Tx), hip levelled ( post Tx)       Neuro Re-ed    Neuro Re-ed Details   cued for technique for HEP, body mechanics/ ergonomic at work station, work [employer]   Exercises  --   see instructions       Manual Therapy   Manual therapy comments  rotational mob L iliac, PA mob at L sacrum to improve nutation, MW/ STM at L QL                    PT Long Term Goals - 11/15/18 1331      PT LONG TERM GOAL #1   Title  Pt will demo equal alignment of iliac crest across 2 weeks in order to sleep on L side without interruption of pain to improve quality of sleep    Time  4    Period  Weeks    Status  New    [place] Date  12/13/18      PT LONG TERM GOAL #2   Title  Pt will demo no imbalance of mm tightness along L QL, restored mobility at thoracic spine, proper lower kinetic chain co-activation in order to participate in line dance and perform the routine pt used to do without L and R hip burning    Time  6    Period  Weeks    Status  New    Target Date  12/27/18      PT LONG TERM GOAL #3   Title  Pt will apply biomechanics / ergonomics at her work station in  order to sit at work without pain    Time  2    Period  Weeks    Status  New    Target Date  11/29/18      PT LONG TERM GOAL #4   Title  Pt will demo decreased score on  [place] from 83% to < 53% in order to improve QOL    Time  10    Period  Weeks    Status  New    Target Date  01/10/19      PT LONG TERM GOAL #5   Title  Pt will no longer need to sleep in her recliner across 2 weeks in order to optimize quality of sleep    Time  8    Period  Weeks    Status  New    Target Date  01/10/19             Plan - 11/15/18 1348    Clinical Impression Statement  Pt is a 63 yo female who complains of R hip pain three months ago which occurs with sitting and sleeping on L side. Denied radiating pain, denied abdominal pain, LBP. Clinical presentations showed pelvic malalignment, ( L iliac crest higher), pain with R trunk rotation, increased QL tensions on L, gait deviations. Signs of poor intraabdominal pressure system and postural stability include: limited thoracic mobility with dyscoordination of deep core, and abdominal bulging. Additional factors that impact pt's Sx include Hx of fall 3 years ago onto her L side, RA, Morton's Neuroma surgery on L foot, surgery on L foot 4th toe. Pt will benefit from regional interdependent approach given multiple factors impacting her lower kinetic chain. Following Tx today, manual Tx was applied to achieve pelvic alignment and decreased  L QL mm tensions. Post Tx, pt reported no pain with R trunk rotation and with sitting. Plan to address abdominal bulging and thoracic spine and deep core system.    Personal Factors and Comorbidities  Comorbidity 2    Comorbidities  Arthritis, HBP, High Cholesterol,    Examination-Participation Restrictions  Other    Stability/Clinical Decision Making  Evolving/Moderate complexity    Clinical Decision Making  Moderate    Rehab Potential  Good    PT Frequency  1x / week    PT Duration  Other (comment)   10   PT Treatment/Interventions  Balance training;Neuromuscular re-education;Stair training;Moist Heat;Functional mobility training;Therapeutic activities;Therapeutic exercise;Patient/family  education;Manual techniques;Scar mobilization;Energy conservation;Manual lymph drainage;Taping;ADLs/Self Care Home Management    Consulted and Agree with Plan of Care  Patient       Patient will benefit from skilled therapeutic intervention in order to improve the following deficits and impairments:  Abnormal gait, Improper body mechanics, Pain, Increased muscle spasms, Postural dysfunction, Decreased coordination, Decreased strength, Decreased endurance, Decreased activity tolerance, Hypomobility, Difficulty walking, Decreased safety awareness, Decreased balance, Decreased range of motion  Visit Diagnosis: 1. Other abnormalities of gait and mobility   2. Sacrococcygeal disorders, not elsewhere classified   3. Other muscle spasm   4. Other lack of coordination        Problem List Patient Active Problem List   Diagnosis Date Noted  . Left hip pain 11/06/2018  . Abnormal mammogram 02/22/2018  . Mass of upper outer quadrant of left breast 06/04/2017  . 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 History of colonic polyps 02/13/2016  . Special screening for malignant neoplasms, colon   . Benign neoplasm of ascending colon   . Rectal polyp   .  Abdominal pain 12/01/2015  . Hyperglycemia 08/07/2014  . Health care maintenance 05/04/2014  . Adult BMI 37.0-37.9 kg/sq m 05/04/2014  . Foot pain 11/22/2013  . Post-menopausal bleeding 07/13/2012  . Hypertension 03/18/2012  . Hypercholesterolemia 03/18/2012  . Abnormal liver function tests 03/18/2012  . Migraine headache 03/18/2012    Jerl Mina ,PT, DPT, E-RYT  11/15/2018, 1:54 PM  [place] MAIN [facility] SERVICES [address] [place], [place], [address] Phone: [phone]   Fax:  [phone]  Name: Kathleen Yu MRN: [id number] Date of Birth: 1955-11-30

## 2018-11-15 NOTE — Patient Instructions (Signed)
Morning/ night:   Open book ( handout)  R sidelying  Only this week  Open L relaxed hands. Arms   15 reps rotate L   ___  Log rolling out of bed   ___   During the day at work   _ trunk rotations L/R every 2hours 10 reps   _back stretch from the table,    Walking hands to the R side to lengthen L low back    ___  At work:  Place a towel in the bucket seat to level up the surface

## 2018-11-22 ENCOUNTER — Other Ambulatory Visit: Payer: Self-pay

## 2018-11-22 ENCOUNTER — Ambulatory Visit: Payer: 59 | Admitting: Physical Therapy

## 2018-11-22 DIAGNOSIS — R278 Other lack of coordination: Secondary | ICD-10-CM | POA: Diagnosis not present

## 2018-11-22 DIAGNOSIS — M62838 Other muscle spasm: Secondary | ICD-10-CM | POA: Diagnosis not present

## 2018-11-22 DIAGNOSIS — M533 Sacrococcygeal disorders, not elsewhere classified: Secondary | ICD-10-CM

## 2018-11-22 DIAGNOSIS — R2689 Other abnormalities of gait and mobility: Secondary | ICD-10-CM

## 2018-11-22 NOTE — Patient Instructions (Signed)
Stretches:  open book from last week ,   Do not lift the arms   add angel wings,. Do not lift the arms off the bed  10 x   add knee to chest ( opp feet down, knee bent) 5 , exhale, knee to chest   __   Uponreturning from work  Prop legs up over pillows,  Settling into the resting position  Lower ribs first then buttocks to not have your back arched

## 2018-11-22 NOTE — Therapy (Addendum)
[place] MAIN [facility] SERVICES [address] [place], [place], [address] Phone: [phone]   Fax:  [phone]  Physical Therapy Treatment  Patient Details  Name: Kathleen Yu MRN: [id number] Date of Birth: [birth year] No data recorded  Encounter Date: 11/22/2018  PT End of Session - 11/22/18 0927    Visit Number  2    Number of Visits  10    PT Start Time  0803    PT Stop Time  0900    PT Time Calculation (min)  57 min       Past Medical History:  Diagnosis Date  . Abnormal liver function tests   . Allergy   . Anemia    H/O  . Arthritis    RA  . Depression   . Fatty liver   . Hypercholesterolemia   . Hypertension   . Migraine headache    better since menopause  . Neuromuscular disorder ([place])   . Recurrent sinusitis    allergies  . Tuberculosis 1980'S   + TB TEST-WAS TREATED    Past Surgical History:  Procedure Laterality Date  . BILATERAL CARPAL TUNNEL RELEASE    . BREAST BIOPSY Left 2004   x 2  . BREAST BIOPSY Left 05/12/2017   FRAGMENTS WITH FEATURES OF AN EARLY INTRADUCTAL PAPILLOMA WITH aprocine changes  . BREAST BIOPSY Left 06/12/2017   Procedure: BREAST BIOPSY;  Surgeon: Robert Bellow, MD;  Location: [facility];  Service: General;  Laterality: Left;  . BREAST DUCTAL SYSTEM EXCISION Left 06/12/2017   Procedure: EXCISION DUCTAL SYSTEM BREAST;  Surgeon: Robert Bellow, MD;  Location: [facility];  Service: General;  Laterality: Left;  . COLONOSCOPY WITH PROPOFOL N/A 02/11/2016   Procedure: COLONOSCOPY WITH PROPOFOL;  Surgeon: Lucilla Lame, MD;  Location: [place];  Service: Endoscopy;  Laterality: N/A;  . 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 FRACTURE SURGERY  2017  . OSTECTOMY Left 06/01/2015   Procedure: OSTECTOMY/ part. excision fracture fragment left 4th  toe;  Surgeon: Sharlotte Alamo, DPM;  Location: [facility];  Service: Podiatry;  Laterality: Left;  . POLYPECTOMY N/A 02/11/2016   Procedure: POLYPECTOMY;  Surgeon: Lucilla Lame, MD;  [facility];  Service: Endoscopy;  Laterality: N/A;  . RHINOPLASTY    . TUBAL LIGATION  1997    There were no vitals filed for this visit.  Subjective Assessment - 11/22/18 0807    Subjective  pt felt a little sore lafter last session but it was a good sore because she felt looser. Her L hip did not bother her when sitting long periods all last week. Doing the exercises did help when the pain came on when sitting at the des at job.    Pertinent History  Fall on her L side on carport/ kitchen steps 3 years ago, RA, Morton's Neuroma Surgery on L foot, Surgery on L foot 4th toe to remove bone chip. Pt wore a post op shoe for 6 weeks. Pt does not have LBP.    Patient Stated Goals  to hurt when sitting or sleeping         [facility] PT Assessment - 11/22/18 0811      Coordination   Coordination and Movement Description  limited diaphragmatic excursion pre Tx, increased post Tx)  Palpation   Spinal mobility  R rotation ~ 30%, L ~60% ( Post Tx: R 60% )      SI assessment   R PSIS more posterior standing  9 Post Tx: levelled)     Palpation comment  increased paraspinal mm tightness / medial scapula B.  Restricted facial over umbilica scar                    [facility] Adult PT Treatment/Exercise - 11/22/18 7209      Neuro Re-ed    Neuro Re-ed Details   cued for less upper trp overuse . explained about bra tightness with extensor on strap. to optomize diaphramgatic excursion , new HEP stretch for scap depression /retraction      Moist Heat Therapy   Number Minutes Moist Heat  5 Minutes    Moist Heat Location  --   guided on decerasing lumbar lordosis     Manual Therapy   Manual therapy comments  long axis distraction R LE, rotational mob. superior glide on sacrum , STM/ MWM along  paraspinals, PA mob Grade III along T-spine to promote thoracic extension,                    PT Long Term Goals - 11/15/18 1331      PT LONG TERM GOAL #1   Title  Pt will demo equal alignment of iliac crest across 2 weeks in order to sleep on L side without interruption of pain to improve quality of sleep    Time  4    Period  Weeks    Status  New    Target Date  12/13/18      PT LONG TERM GOAL #2   Title  Pt will demo no imbalance of mm tightness along L QL, restored mobility at thoracic spine, proper lower kinetic chain co-activation in order to participate in line dance and perform the routine pt used to do without L and R hip burning    Time  6    Period  Weeks    Status  New    Target Date  12/27/18      PT LONG TERM GOAL #3   Title  Pt will apply biomechanics / ergonomics at her work station in order to sit at work without pain    Time  2    Period  Weeks    Status  New    Target Date  11/29/18      PT LONG TERM GOAL #4   Title  Pt will demo decreased score on [place] from 83% to < 53% in order to improve QOL    Time  10    Period  Weeks    Status  New    Target Date  01/10/19      PT LONG TERM GOAL #5   Title  Pt will no longer need to sleep in her recliner across 2 weeks in order to optimize quality of sleep    Time  8    Period  Weeks    Status  New    Target Date  01/10/19            Plan - 11/22/18 0934    Clinical Impression Statement  Pt is responded positively to last session as her L hip did not bother her when sitting long periods all last week. Continued to decrease spinal mm tensions and to minimize upper trap overuse to optimize  diaphragmatic excursion. Pt tolerated manual Tx without complaints. Also addressed R pelvic obliquity today with manual Tx which rendered more equal pelvic alignment and increased R trunk rotation. 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 Suspect repeated  overuse / positions are one of the contributing factors to hip pain. Progress with deep core strengthening at upcoming session. Pt continues to benefit from skilled PT.    Personal Factors and Comorbidities  Comorbidity 2    Comorbidities  Arthritis, HBP, High Cholesterol,    Examination-Participation Restrictions  Other    Stability/Clinical Decision Making  Evolving/Moderate complexity    Rehab Potential  Good    PT Frequency  1x / week    PT Duration  Other (comment)   10   PT Treatment/Interventions  Balance training;Neuromuscular re-education;Stair training;Moist Heat;Functional mobility training;Therapeutic activities;Therapeutic exercise;Patient/family education;Manual techniques;Scar mobilization;Energy conservation;Manual lymph drainage;Taping;ADLs/Self Care Home Management    Consulted and Agree with Plan of Care  Patient       Patient will benefit from skilled therapeutic intervention in order to improve the following deficits and impairments:  Abnormal gait, Improper body mechanics, Pain, Increased muscle spasms, Postural dysfunction, Decreased coordination, Decreased strength, Decreased endurance, Decreased activity tolerance, Hypomobility, Difficulty walking, Decreased safety awareness, Decreased balance, Decreased range of motion  Visit Diagnosis: Other abnormalities of gait and mobility  Sacrococcygeal disorders, not elsewhere classified  Other muscle spasm  Other lack of coordination     Problem List Patient Active Problem List   Diagnosis Date Noted  . Left hip pain 11/06/2018  . Abnormal mammogram 02/22/2018  . Mass of upper outer quadrant of left breast 06/04/2017  . 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 History of colonic polyps 02/13/2016  . Special screening for malignant neoplasms, colon   . Benign neoplasm of ascending colon   . Rectal polyp   . Abdominal pain 12/01/2015  . Hyperglycemia 08/07/2014  . Health care maintenance 05/04/2014  . Adult BMI  37.0-37.9 kg/sq m 05/04/2014  . Foot pain 11/22/2013  . Post-menopausal bleeding 07/13/2012  . Hypertension 03/18/2012  . Hypercholesterolemia 03/18/2012  . Abnormal liver function tests 03/18/2012  . Migraine headache 03/18/2012    Jerl Mina 11/22/2018, 9:55 AM  [place] MAIN [facility] SERVICES [address] [place], [place], [address] Phone: [phone]   Fax:  [phone]  Name: ABBAGALE GOGUEN MRN: [id number] Date of Birth: 01/31/1956

## 2018-11-25 ENCOUNTER — Telehealth: Payer: Self-pay | Admitting: *Deleted

## 2018-11-25 DIAGNOSIS — E1165 Type 2 diabetes mellitus with hyperglycemia: Secondary | ICD-10-CM

## 2018-11-25 NOTE — Telephone Encounter (Signed)
Patient called for results: Notes recorded by Einar Pheasant, MD on 11/12/2018 at 5:20 AM EDT  Notify pt that her triglycerides have increased. Bad cholesterol stable. Send [facility] Lipid diet. Low carb diet and exercise. We will follow. One liver test is slightly elevated. Remainder of liver function tests are wnl. Continue diet and exercise. Overall sugar control is slightly increased from previous check. a1c 6.8. This is c/w diabetes. If has not been, would like to refer her to [facility] for diabetes education and diet instruction. Also, would like to schedule an appt (doxy) to discuss treatment options.  Patient advised of lab results and PCP recommendation. Patient has not received anything in mail regarding diet. Patient will need referral to [facility] and appointment with provider. Attempted to contact office- unable to get through and patient is at work- please call her back to schedule appointment-follow up

## 2018-11-26 ENCOUNTER — Encounter: Payer: Self-pay | Admitting: *Deleted

## 2018-11-26 NOTE — Telephone Encounter (Signed)
Patient is agreeable to referral. She also has been scheduled for a doxy visit on 9/8 with Dr Nicki Reaper. I have mailed [place].

## 2018-11-28 DIAGNOSIS — E1165 Type 2 diabetes mellitus with hyperglycemia: Secondary | ICD-10-CM | POA: Insufficient documentation

## 2018-11-28 NOTE — Telephone Encounter (Signed)
Order placed for [facility] referral.   

## 2018-11-28 NOTE — Addendum Note (Signed)
Addended by: Alisa Graff on: 11/28/2018 10:59 PM   Modules accepted: Orders

## 2018-11-29 ENCOUNTER — Other Ambulatory Visit: Payer: Self-pay

## 2018-11-29 ENCOUNTER — Ambulatory Visit: Payer: 59 | Admitting: Physical Therapy

## 2018-11-29 DIAGNOSIS — R278 Other lack of coordination: Secondary | ICD-10-CM | POA: Diagnosis not present

## 2018-11-29 DIAGNOSIS — M533 Sacrococcygeal disorders, not elsewhere classified: Secondary | ICD-10-CM

## 2018-11-29 DIAGNOSIS — R2689 Other abnormalities of gait and mobility: Secondary | ICD-10-CM

## 2018-11-29 DIAGNOSIS — M62838 Other muscle spasm: Secondary | ICD-10-CM | POA: Diagnosis not present

## 2018-11-29 NOTE — Patient Instructions (Signed)
Line dancing;  more forward center of mass ( navel over shin bones/ ballmounds) not heavy on your heels   Put equal weight across ballmounds not just on pinky side  ___  Seated stretches:  At work    Foot up and out 20 reps   One knee extended, toes up,  10 reps    ___   After work:  prop legs up elevated  10 mins   toe and toes range of motions   Deep core level 1 and 2    Calf and hamstring stretches with towel/ long strap ( 10 reps with toes pointed forward, 10 reps toes pointed towards to middle)

## 2018-11-29 NOTE — Therapy (Addendum)
[place] MAIN [facility] SERVICES [address] [place], [place], [address] Phone: [phone]   Fax:  [phone]  Physical Therapy Treatment  Patient Details  Name: Kathleen Yu MRN: [id number] Date of Birth: Jan 12, 1956 No data recorded  Encounter Date: 11/29/2018  PT End of Session - 11/29/18 0902    Visit Number  3    Number of Visits  10    PT Start Time  0804    PT Stop Time  0903    PT Time Calculation (min)  59 min    Activity Tolerance  Patient tolerated treatment well;No increased pain    Behavior During Therapy  WFL for tasks assessed/performed       Past Medical History:  Diagnosis Date  . Abnormal liver function tests   . Allergy   . Anemia    H/O  . Arthritis    RA  . Depression   . Fatty liver   . Hypercholesterolemia   . Hypertension   . Migraine headache    better since menopause  . Neuromuscular disorder ([place])   . Recurrent sinusitis    allergies  . Tuberculosis 1980'S   + TB TEST-WAS TREATED    Past Surgical History:  Procedure Laterality Date  . BILATERAL CARPAL TUNNEL RELEASE    . BREAST BIOPSY Left 2004   x 2  . BREAST BIOPSY Left 05/12/2017   FRAGMENTS WITH FEATURES OF AN EARLY INTRADUCTAL PAPILLOMA WITH aprocine changes  . BREAST BIOPSY Left 06/12/2017   Procedure: BREAST BIOPSY;  Surgeon: Robert Bellow, MD;  Location: [facility];  Service: General;  Laterality: Left;  . BREAST DUCTAL SYSTEM EXCISION Left 06/12/2017   Procedure: EXCISION DUCTAL SYSTEM BREAST;  Surgeon: Robert Bellow, MD;  Location: [facility];  Service: General;  Laterality: Left;  . COLONOSCOPY WITH PROPOFOL N/A 02/11/2016   Procedure: COLONOSCOPY WITH PROPOFOL;  Surgeon: Lucilla Lame, MD;  Location: [place];  Service: Endoscopy;  Laterality: N/A;  . EXCISION MORTON'S NEUROMA Left 06/01/2015   Procedure: EXCISION MORTON'S NEUROMA;  Surgeon: Sharlotte Alamo, DPM;  Location: [facility];  Service: Podiatry;  Laterality: Left;   Local w/ mac  . FRACTURE SURGERY  2017  . OSTECTOMY Left 06/01/2015   Procedure: OSTECTOMY/ part. excision fracture fragment left 4th toe;  Surgeon: Sharlotte Alamo, DPM;  Location: [facility];  Service: Podiatry;  Laterality: Left;  . POLYPECTOMY N/A 02/11/2016   Procedure: POLYPECTOMY;  Surgeon: Lucilla Lame, MD;  Location: [place];  Service: Endoscopy;  Laterality: N/A;  . RHINOPLASTY    . TUBAL LIGATION  1997    There were no vitals filed for this visit.  Subjective Assessment - 11/29/18 0814    Subjective  Pt  felt a 2/10 pain in L hip after line dancing this weekend. Pt did not complete dancing through the whole song. Pt feels a pokey pain in the lower glut.    Pertinent History  Fall on her L side on carport/ kitchen steps 3 years ago, RA, Morton's Neuroma Surgery on L foot, Surgery on L foot 4th toe to remove bone chip. Pt wore a post op shoe for 6 weeks. Pt does not have LBP.    Patient Stated Goals  to hurt when sitting or sleeping         [facility] PT Assessment - 11/29/18 0856      Other:   Other/ Comments  simulated line dancing routine: noted posterior COM, supination, minimal pivoting on  transverse arch       Palpation   SI assessment   L iliac crest higher, SIJ limited in hip ER/abd  ( post Tx: improved, iliac crest levelled)    Palpation comment  increased mm  transverse arch R, peroneus longus/brevis, ant tib/ ext digitorium on RLE , L peroneus longus/ brevis/ gastroc L                    [facility] Adult PT Treatment/Exercise - 11/29/18 0850      Neuro Re-ed    Neuro Re-ed Details   cued for stretches and more anterior COM/ transverse arch coactivaiton when line dancing ( simulated dance routine)       Moist Heat Therapy   Number Minutes Moist Heat  5 Minutes    Moist Heat Location  --   RLE leg /foot      Manual Therapy   Manual therapy comments  long axis distraction, PA mob on LLE/ SIJ to faciliate inferior positio of ilia and hip ER/abd , MWM /STM  on at transverse arch R, peroneus longus/brevis, ant tib/ ext digitorium on RLE , L peroneus longus/ brevis/ gastroc L                   PT Long Term Goals - 11/15/18 1331      PT LONG TERM GOAL #1   Title  Pt will demo equal alignment of iliac crest across 2 weeks in order to sleep on L side without interruption of pain to improve quality of sleep    Time  4    Period  Weeks    Status  New    Target Date  12/13/18      PT LONG TERM GOAL #2   Title  Pt will demo no imbalance of mm tightness along L QL, restored mobility at thoracic spine, proper lower kinetic chain co-activation in order to participate in line dance and perform the routine pt used to do without L and R hip burning    Time  6    Period  Weeks    Status  New    Target Date  12/27/18      PT LONG TERM GOAL #3   Title  Pt will apply biomechanics / ergonomics at her work station in order to sit at work without pain    Time  2    Period  Weeks    Status  New    Target Date  11/29/18      PT LONG TERM GOAL #4   Title  Pt will demo decreased score on [place] from 83% to < 53% in order to improve QOL    Time  10    Period  Weeks    Status  New    Target Date  01/10/19      PT LONG TERM GOAL #5   Title  Pt will no longer need to sleep in her recliner across 2 weeks in order to optimize quality of sleep    Time  8    Period  Weeks    Status  New    Target Date  01/10/19            Plan - 11/29/18 0910    Clinical Impression Statement  Addressed pt's goal with line dancing. Pt had experienced 2/10 pain with L hip  with line dancing over the weekend. Assessed simulated line dancing routine and pt demo'd posterior COM, supination  with backward stepping, minimal use of transverse arch with pivot turns. Palpation showed L upslip of iliac crest,  increased tightness at B leg and feet.  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 Following Tx, pt demo'd increased feet mobility and mm pliability and was able to demo more anterior COM and use of transverse arch in simulated dance routine. Plan to assess her shoe wear, pelvic floor, and reassess iliac crest alignment at next session. Pt continues to benefit from skilled PT.    Personal Factors and Comorbidities  Comorbidity 2    Comorbidities  Arthritis, HBP, High Cholesterol,    Examination-Participation Restrictions  Other    Stability/Clinical Decision Making  Evolving/Moderate complexity    Rehab Potential  Good    PT Frequency  1x / week    PT Duration  Other (comment)   10   PT Treatment/Interventions  Balance training;Neuromuscular re-education;Stair training;Moist Heat;Functional mobility training;Therapeutic activities;Therapeutic exercise;Patient/family education;Manual techniques;Scar mobilization;Energy conservation;Manual lymph drainage;Taping;ADLs/Self Care Home Management    Consulted and Agree with Plan of Care  Patient       Patient will benefit from skilled therapeutic intervention in order to improve the following deficits and impairments:  Abnormal gait, Improper body mechanics, Pain, Increased muscle spasms, Postural dysfunction, Decreased coordination, Decreased strength, Decreased endurance, Decreased activity tolerance, Hypomobility, Difficulty walking, Decreased safety awareness, Decreased balance, Decreased range of motion  Visit Diagnosis: Other abnormalities of gait and mobility  Sacrococcygeal disorders, not elsewhere classified  Other muscle spasm  Other lack of coordination     Problem List Patient Active Problem List   Diagnosis Date Noted  . Type 2 diabetes mellitus with hyperglycemia ([place]) 11/28/2018  . Left hip pain 11/06/2018  . Abnormal mammogram 02/22/2018  . Mass of upper outer quadrant of left breast 06/04/2017  . 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 History of colonic polyps 02/13/2016  .  Special screening for malignant neoplasms, colon   . Benign neoplasm of ascending colon   . Rectal polyp   . Abdominal pain 12/01/2015  . Hyperglycemia 08/07/2014  . Health care maintenance 05/04/2014  . Adult BMI 37.0-37.9 kg/sq m 05/04/2014  . Foot pain 11/22/2013  . Post-menopausal bleeding 07/13/2012  . Hypertension 03/18/2012  . Hypercholesterolemia 03/18/2012  . Abnormal liver function tests 03/18/2012  . Migraine headache 03/18/2012    Jerl Mina ,PT, DPT, E-RYT  11/29/2018, 9:21 AM  [place] MAIN [facility] SERVICES [address] [place], [place], [address] Phone: [phone]   Fax:  [phone]  Name: Kathleen Yu MRN: [id number] Date of Birth: 1955/05/07

## 2018-12-07 ENCOUNTER — Other Ambulatory Visit: Payer: Self-pay

## 2018-12-07 ENCOUNTER — Ambulatory Visit (INDEPENDENT_AMBULATORY_CARE_PROVIDER_SITE_OTHER): Payer: 59 | Admitting: Internal Medicine

## 2018-12-07 DIAGNOSIS — R945 Abnormal results of liver function studies: Secondary | ICD-10-CM

## 2018-12-07 DIAGNOSIS — I10 Essential (primary) hypertension: Secondary | ICD-10-CM

## 2018-12-07 DIAGNOSIS — M25552 Pain in left hip: Secondary | ICD-10-CM | POA: Diagnosis not present

## 2018-12-07 DIAGNOSIS — E7800 Pure hypercholesterolemia, unspecified: Secondary | ICD-10-CM | POA: Diagnosis not present

## 2018-12-07 DIAGNOSIS — R739 Hyperglycemia, unspecified: Secondary | ICD-10-CM | POA: Diagnosis not present

## 2018-12-07 DIAGNOSIS — R7989 Other specified abnormal findings of blood chemistry: Secondary | ICD-10-CM

## 2018-12-07 DIAGNOSIS — E1165 Type 2 diabetes mellitus with hyperglycemia: Secondary | ICD-10-CM

## 2018-12-07 MED ORDER — METFORMIN HCL 500 MG PO TABS: 500.0000 mg | ORAL_TABLET | Freq: Every day | ORAL | 1 refills | Status: DC

## 2018-12-07 NOTE — Progress Notes (Signed)
[name], female   DOB: [birth year], 63 y.o.   MRN: [id number]   Virtual Visit via video Note  This visit type was conducted due to national recommendations for restrictions regarding the COVID-19 pandemic (e.g. social distancing).  This format is felt to be most appropriate for this patient at this time.  All issues noted in this document were discussed and addressed.  No physical exam was performed (except for noted visual exam findings with Video Visits).   I connected with Dalbert Batman by a video enabled telemedicine application and verified that I am speaking with the correct person using two identifiers. Location patient: home Location provider: work Persons participating in the virtual visit: patient, provider  I discussed the limitations, risks, security and privacy concerns of performing an evaluation and management service by video and the availability of in person appointments.  The patient expressed understanding and agreed to proceed.   Reason for visit: workin in appt.    HPI: Here to discuss labs and diabetes - treatment options.  Discussed recent labs. Discussed elevated a1c 6.8 and triglycerides.  Discussed low carb diet and exercise.  She tries to stay active.  No chest pain.  No sob.  No acid reflux.  No abdominal pain.  Bowels moving.  Doing therapy for her hip.  Therapy helping.  Discussed treatment for sugars.  Agreeable to start metformin.     ROS: See pertinent positives and negatives per HPI.  Past Medical History:  Diagnosis Date  . Abnormal liver function tests   . Allergy   . Anemia    H/O  . Arthritis    RA  . Depression   . Fatty liver   . Hypercholesterolemia   . Hypertension   . Migraine headache    better since menopause  . Neuromuscular disorder ([place])   . Recurrent sinusitis    allergies  . Tuberculosis 1980'S   + TB TEST-WAS TREATED    Past Surgical History:  Procedure Laterality Date  . BILATERAL CARPAL TUNNEL RELEASE     . BREAST BIOPSY Left 2004   x 2  . BREAST BIOPSY Left 05/12/2017   FRAGMENTS WITH FEATURES OF AN EARLY INTRADUCTAL PAPILLOMA WITH aprocine changes  . BREAST BIOPSY Left 06/12/2017   Procedure: BREAST BIOPSY;  Surgeon: Robert Bellow, MD;  Location: [facility];  Service: General;  Laterality: Left;  . BREAST DUCTAL SYSTEM EXCISION Left 06/12/2017   Procedure: EXCISION DUCTAL SYSTEM BREAST;  Surgeon: Robert Bellow, MD;  Location: [facility];  Service: General;  Laterality: Left;  . COLONOSCOPY WITH PROPOFOL N/A 02/11/2016   Procedure: COLONOSCOPY WITH PROPOFOL;  Surgeon: Lucilla Lame, MD;  Location: [facility];  Service: Endoscopy;  Laterality: N/A;  . 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 FRACTURE SURGERY  2017  . OSTECTOMY Left 06/01/2015   Procedure: OSTECTOMY/ part. excision fracture fragment left 4th toe;  Surgeon: Sharlotte Alamo, DPM;  Location: [facility];  Service: Podiatry;  Laterality: Left;  . POLYPECTOMY N/A 02/11/2016   Procedure: POLYPECTOMY;  Surgeon: Lucilla Lame, MD;  Location: [facility];  Service: Endoscopy;  Laterality: N/A;  . RHINOPLASTY    . TUBAL LIGATION  1997    Family History  Problem Relation Age of Onset  . Heart disease Father        died age 42 (  MI)  . Heart disease Other        4 uncles - CABG  . Colon cancer Other        great aunt  . Heart disease Paternal Grandmother        first MI - 36s  . Migraines Mother   . Breast cancer Mother 50  . Hypertension Mother   . Hypertension Maternal Grandmother   . Early death Maternal Grandmother   . Diabetes Paternal Grandfather   . Stroke Paternal Grandfather   . Depression Maternal Aunt   . Breast cancer Maternal Aunt   . Breast cancer Paternal Aunt        2 aunts    SOCIAL HX: reviewed.    Current Outpatient Medications:  .  acetaminophen  (TYLENOL) 500 MG tablet, Take 1,000 mg by mouth daily as needed for moderate pain. , Disp: , Rfl:  .  amLODipine (NORVASC) 10 MG tablet, TAKE 1 TABLET BY MOUTH DAILY, Disp: 90 tablet, Rfl: 1 .  fexofenadine-pseudoephedrine (ALLEGRA-D) 60-120 MG 12 hr tablet, Take 1 tablet by mouth daily as needed (allergies). , Disp: , Rfl:  .  fluticasone (FLONASE) 50 MCG/ACT nasal spray, Place 2 sprays into the nose daily as needed for allergies. , Disp: , Rfl:  .  hydrochlorothiazide (HYDRODIURIL) 25 MG tablet, TAKE 1 TABLET BY MOUTH DAILY, Disp: 90 tablet, Rfl: 1 .  ibuprofen (ADVIL,MOTRIN) 200 MG tablet, Take 600-800 mg by mouth daily as needed (arthritis). , Disp: , Rfl:  .  lisinopril (ZESTRIL) 40 MG tablet, TAKE 1 TABLET BY MOUTH DAILY, Disp: 90 tablet, Rfl: 1 .  loratadine (CLARITIN) 10 MG tablet, Take 10 mg by mouth daily. , Disp: , Rfl:  .  metFORMIN (GLUCOPHAGE) 500 MG tablet, Take 1 tablet (500 mg total) by mouth daily., Disp: 30 tablet, Rfl: 1 .  naproxen sodium (ANAPROX) 220 MG tablet, Take 440 mg by mouth daily as needed (pain). , Disp: , Rfl:  .  PARoxetine (PAXIL) 10 MG tablet, TAKE 1 TABLET BY MOUTH EVERY MORNING, Disp: 90 tablet, Rfl: 1  EXAM:  GENERAL: alert, oriented, appears well and in no acute distress  HEENT: atraumatic, conjunttiva clear, no obvious abnormalities on inspection of external nose and ears  NECK: normal movements of the head and neck  LUNGS: on inspection no signs of respiratory distress, breathing rate appears normal, no obvious gross SOB, gasping or wheezing  CV: no obvious cyanosis  PSYCH/NEURO: pleasant and cooperative, no obvious depression or anxiety, speech and thought processing grossly intact  ASSESSMENT AND PLAN:  Discussed the following assessment and plan:  Abnormal liver function tests Previous ultrasound - fatty liver.  Previously worked up by GI.  Discussed low carb diet and exercise.  Follow liver function tests.    Hypercholesterolemia Low  carb diet and exercise.  Follow liver function tests.    Hyperglycemia Low carb diet and exercise.  Follow met b and a1c.   Hypertension Blood pressure under good control.  Continue same medication regimen.  Follow pressures.  Follow metabolic panel.    Left hip pain Better with therapy.  Continue exercises.    Type 2 diabetes mellitus with hyperglycemia (HCC) Low carb diet and exercise.  Discussed with her today.  She declines Lifestyles referral.  agreeable to start metformin.  Give her a glucometer to check sugars.  Follow met b and a1c.     I discussed the assessment and treatment plan with the patient. The patient was provided an opportunity to  ask questions and all were answered. The patient agreed with the plan and demonstrated an understanding of the instructions.   The patient was advised to call back or seek an in-person evaluation if the symptoms worsen or if the condition fails to improve as anticipated.   Einar Pheasant, MD

## 2018-12-09 ENCOUNTER — Ambulatory Visit: Payer: 59 | Attending: Internal Medicine | Admitting: Physical Therapy

## 2018-12-09 ENCOUNTER — Other Ambulatory Visit: Payer: Self-pay

## 2018-12-09 DIAGNOSIS — R2689 Other abnormalities of gait and mobility: Secondary | ICD-10-CM | POA: Diagnosis not present

## 2018-12-09 DIAGNOSIS — M533 Sacrococcygeal disorders, not elsewhere classified: Secondary | ICD-10-CM | POA: Diagnosis not present

## 2018-12-09 DIAGNOSIS — M62838 Other muscle spasm: Secondary | ICD-10-CM | POA: Diagnosis not present

## 2018-12-09 DIAGNOSIS — R278 Other lack of coordination: Secondary | ICD-10-CM | POA: Diagnosis not present

## 2018-12-10 NOTE — Therapy (Addendum)
[place] MAIN [facility] SERVICES [address] [place], [place], [address] Phone: [phone]   Fax:  [phone]  Physical Therapy Treatment / Progress Note  Patient Details  Name: Kathleen Yu MRN: [id number] Date of Birth: Dec 28, 1955 No data recorded  Encounter Date: 12/09/2018  PT End of Session - 12/09/18 0942    Visit Number  4    Number of Visits  10    PT Start Time  0911    PT Stop Time  1005    PT Time Calculation (min)  54 min    Activity Tolerance  Patient tolerated treatment well;No increased pain    Behavior During Therapy  WFL for tasks assessed/performed       Past Medical History:  Diagnosis Date  . Abnormal liver function tests   . Allergy   . Anemia    H/O  . Arthritis    RA  . Depression   . Fatty liver   . Hypercholesterolemia   . Hypertension   . Migraine headache    better since menopause  . Neuromuscular disorder ([place])   . Recurrent sinusitis    allergies  . Tuberculosis 1980'S   + TB TEST-WAS TREATED    Past Surgical History:  Procedure Laterality Date  . BILATERAL CARPAL TUNNEL RELEASE    . BREAST BIOPSY Left 2004   x 2  . BREAST BIOPSY Left 05/12/2017   FRAGMENTS WITH FEATURES OF AN EARLY INTRADUCTAL PAPILLOMA WITH aprocine changes  . BREAST BIOPSY Left 06/12/2017   Procedure: BREAST BIOPSY;  Surgeon: Robert Bellow, MD;  Location: [facility];  Service: General;  Laterality: Left;  . BREAST DUCTAL SYSTEM EXCISION Left 06/12/2017   Procedure: EXCISION DUCTAL SYSTEM BREAST;  Surgeon: Robert Bellow, MD;  Location: [facility];  Service: General;  Laterality: Left;  . COLONOSCOPY WITH PROPOFOL N/A 02/11/2016   Procedure: COLONOSCOPY WITH PROPOFOL;  Surgeon: Lucilla Lame, MD;  Location: [facility];  Service: Endoscopy;  Laterality: N/A;  . EXCISION MORTON'S NEUROMA Left 06/01/2015   Procedure: EXCISION MORTON'S NEUROMA;  Surgeon: Sharlotte Alamo, DPM;  Location: [facility];  Service: Podiatry;   Laterality: Left;  Local w/ mac  . FRACTURE SURGERY  2017  . OSTECTOMY Left 06/01/2015   Procedure: OSTECTOMY/ part. excision fracture fragment left 4th toe;  Surgeon: Sharlotte Alamo, DPM;  Location: [facility];  Service: Podiatry;  Laterality: Left;  . POLYPECTOMY N/A 02/11/2016   Procedure: POLYPECTOMY;  Surgeon: Lucilla Lame, MD;  Location: [facility];  Service: Endoscopy;  Laterality: N/A;  . RHINOPLASTY    . TUBAL LIGATION  1997    There were no vitals filed for this visit.  Subjective Assessment - 12/09/18 0916    Subjective  Pt reported she danced with her husband through 2 karaoke songs without pain. During the first song, she felt something shift in her hip and every thing went back into alignment again. 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    Pertinent History  Fall on her L side on carport/ kitchen steps 3 years ago, RA, Morton's Neuroma Surgery on L foot, Surgery on L foot 4th toe to remove bone chip. Pt wore a post op shoe for 6 weeks. Pt does not have LBP.    Patient Stated Goals  to hurt when sitting or sleeping         [facility] PT Assessment - 12/09/18 0919  Ambulation/Gait   Gait Comments  with work shoes:  limited push off, supination throughout, limited hip and knee flexion                     [facility] Adult PT Treatment/Exercise - 12/10/18 1002      Ambulation/Gait   Gait Comments  with work shoes:  limited push off, supination throughout, limited hip and knee flexion        Therapeutic Activites    Work Simulation  semi tandem stance and weight shift     Other Therapeutic Activities  proper shoe wear to promote ankle/ feet mobility considering Hx of Morton's neuroma and to minimize pelvic/ hip issues       Neuro Re-ed    Neuro Re-ed Details   cued for gait , less supination                   PT Long Term Goals - 12/09/18 0925      PT LONG TERM GOAL #1   Title  Pt will demo equal  alignment of iliac crest across 2 weeks in order to sleep on L side without interruption of pain to improve quality of sleep    Time  4    Period  Weeks    Status  Achieved      PT LONG TERM GOAL #2   Title  Pt will demo no imbalance of mm tightness along L QL, restored mobility at thoracic spine, proper lower kinetic chain co-activation in order to participate in line dance and perform the routine pt used to do without L and R hip burning    Time  6    Period  Weeks    Status  Achieved      PT LONG TERM GOAL #3   Title  Pt will apply biomechanics / ergonomics at her work station in order to sit at work without pain    Time  2    Period  Weeks    Status  Achieved      PT LONG TERM GOAL #4   Title  Pt will demo decreased score on [place] from 83% to < 53% in order to improve QOL  ( 9/10: 14%)    Time  10    Period  Weeks    Status  Achieved      PT LONG TERM GOAL #5   Title  Pt will no longer need to sleep in her recliner across 2 weeks in order to optimize quality of sleep    Time  8    Period  Weeks    Status  Achieved      Additional Long Term Goals   Additional Long Term Goals  Yes      PT LONG TERM GOAL #6   Title  Pt will report no L hip pain across 1 month in orher to return to dancing    Time  8    Period  Weeks    Status  New    [place] Date  02/03/19      PT LONG TERM GOAL #7   Title  Pt will demo proper techniques and alignment with fitness/ gym routine in order to maintain wellness without relapse of L hip pain and minimize injuries    Time  8    Period  Weeks    Status  New    [place] Date  02/03/19  Plan - 12/10/18 1005    Clinical Impression Statement  Across the past 4 visits, pt has achieved 5/7 goals and is progressing well towards her remaining goals. Her pelvic alignment has been restored along with spinal/ hip mobility across the past visits. Pt started to return to dancing again. She danced with her husband through 2 karaoke songs without  pain. 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 Pt has progressed to deep core strengthening. Regional interdependent approach is in place to address her lower kinetic chain given Hx of Morton's neuroma. Addressing lower kinetic chain will help minimize the risk for relapse with long-term benefits.   Pt continues to benefit from skilled PT with decreased frequency to 2 x month as pt enters the fitness/ maintenance phase of rehab.      Personal Factors and Comorbidities  Comorbidity 2    Comorbidities  Arthritis, HBP, High Cholesterol,    Examination-Participation Restrictions  Other    Stability/Clinical Decision Making  Evolving/Moderate complexity    Rehab Potential  Good    PT Frequency 2 x monthly    PT Duration  Other (comment)   10   PT Treatment/Interventions  Balance training;Neuromuscular re-education;Stair training;Moist Heat;Functional mobility training;Therapeutic activities;Therapeutic exercise;Patient/family education;Manual techniques;Scar mobilization;Energy conservation;Manual lymph drainage;Taping;ADLs/Self Care Home Management    Consulted and Agree with Plan of Care  Patient       Patient will benefit from skilled therapeutic intervention in order to improve the following deficits and impairments:  Abnormal gait, Improper body mechanics, Pain, Increased muscle spasms, Postural dysfunction, Decreased coordination, Decreased strength, Decreased endurance, Decreased activity tolerance, Hypomobility, Difficulty walking, Decreased safety awareness, Decreased balance, Decreased range of motion  Visit Diagnosis: Other abnormalities of gait and mobility  Sacrococcygeal disorders, not elsewhere classified  Other muscle spasm  Other lack of coordination     Problem List Patient Active Problem List   Diagnosis Date Noted  . Type 2 diabetes mellitus with hyperglycemia ([place]) 11/28/2018  . Left hip pain 11/06/2018  . Abnormal  mammogram 02/22/2018  . Mass of upper outer quadrant of left breast 06/04/2017  . 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 History of colonic polyps 02/13/2016  . Special screening for malignant neoplasms, colon   . Benign neoplasm of ascending colon   . Rectal polyp   . Abdominal pain 12/01/2015  . Hyperglycemia 08/07/2014  . Health care maintenance 05/04/2014  . Adult BMI 37.0-37.9 kg/sq m 05/04/2014  . Foot pain 11/22/2013  . Post-menopausal bleeding 07/13/2012  . Hypertension 03/18/2012  . Hypercholesterolemia 03/18/2012  . Abnormal liver function tests 03/18/2012  . Migraine headache 03/18/2012    Jerl Mina ,PT, DPT, E-RYT  12/10/2018, 10:06 AM  [place] [facility] SERVICES [address] [place], [place], [address] Phone: [phone]   Fax:  [phone]  Name: WESLEIGH MARKOVIC MRN: [id number] Date of Birth: Mar 05, 1956

## 2018-12-10 NOTE — Patient Instructions (Signed)
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

## 2018-12-11 ENCOUNTER — Encounter: Payer: Self-pay | Admitting: Internal Medicine

## 2018-12-11 NOTE — Assessment & Plan Note (Signed)
Blood pressure under good control.  Continue same medication regimen.  Follow pressures.  Follow metabolic panel.   

## 2018-12-11 NOTE — Assessment & Plan Note (Signed)
Low carb diet and exercise.  Follow met b and a1c.  

## 2018-12-11 NOTE — Assessment & Plan Note (Signed)
Low carb diet and exercise.  Discussed with her today.  She declines Lifestyles referral.  agreeable to start metformin.  Give her a glucometer to check sugars.  Follow met b and a1c.

## 2018-12-11 NOTE — Assessment & Plan Note (Signed)
Previous ultrasound - fatty liver.  Previously worked up by GI.  Discussed low carb diet and exercise.  Follow liver function tests.

## 2018-12-11 NOTE — Assessment & Plan Note (Signed)
Better with therapy.  Continue exercises.

## 2018-12-11 NOTE — Assessment & Plan Note (Signed)
Low carb diet and exercise.  Follow liver function tests.

## 2018-12-13 NOTE — Addendum Note (Signed)
Addended by: Jerl Mina on: 12/13/2018 08:30 AM   Modules accepted: Orders

## 2018-12-15 ENCOUNTER — Telehealth: Payer: Self-pay

## 2018-12-15 NOTE — Telephone Encounter (Signed)
Pt has picked up [facility] lipid diet and glucometer

## 2018-12-15 NOTE — Telephone Encounter (Signed)
-----   Message from Einar Pheasant, MD sent at 12/11/2018  7:31 PM EDT ----- Regarding: [facility] lipid diet and glucometer Please send pt a copy of [facility] Lipid diet.  Also, did we give her a glucometer.  If not, needs one and can send rx for strips.    Dr Nicki Reaper

## 2018-12-16 ENCOUNTER — Encounter: Payer: 59 | Admitting: Physical Therapy

## 2018-12-22 DIAGNOSIS — H524 Presbyopia: Secondary | ICD-10-CM | POA: Diagnosis not present

## 2018-12-23 ENCOUNTER — Ambulatory Visit: Payer: 59 | Admitting: Physical Therapy

## 2018-12-23 ENCOUNTER — Other Ambulatory Visit: Payer: Self-pay

## 2018-12-23 DIAGNOSIS — M62838 Other muscle spasm: Secondary | ICD-10-CM

## 2018-12-23 DIAGNOSIS — R278 Other lack of coordination: Secondary | ICD-10-CM

## 2018-12-23 DIAGNOSIS — R2689 Other abnormalities of gait and mobility: Secondary | ICD-10-CM

## 2018-12-23 DIAGNOSIS — M533 Sacrococcygeal disorders, not elsewhere classified: Secondary | ICD-10-CM

## 2018-12-23 NOTE — Patient Instructions (Addendum)
Bridging series w/ resistive band other side of doorknob:  Level 1:  Position:  Elbows bent, knees hip width apart, heels under knees on top of stable  foot stool   Stabilization points: shoulders, upper arms, back of head pressed into floor. Heel press downward.   Movement: inhale do nothing, exhale pull band by side, lower fists to floor completely while lifting hips.Keep stabilization points engaged when you allow the band to go back to starting position  10 x 2 reps       Level 2:  Position:  Elbows straight, arms raised to ceiling at shoulder height, knees apart like a ballerina,heels together, heels under knees, on top of stable  foot stool   Stabilization points: shoulders, upper arms, back of head pressed into floor. Heel press downward.   Movement: inhale do nothing, exhale pull band by side, lower fists to floor completely while lifting hips. Keep stabilization points engaged when you allow the band to go back to starting position   10 x 2 reps  Shoulder training: Try to imagine you are squeezing a pencil under your armpit and your shoulder blades are down away from your ears and towards each other    __      Transition from standing to floor:  stand to floor transfer :      _ slow     _ mini squat      _ crawl down     _downward dog  - >  shoulders down and bac-  walk the dog ( knee bents to lengthe hamstrings)      Floor to stand :   downward dog   crawl hands back, butt is back, knees behind toes -> squat  Hands at waist , elbows back, chest lifts      ___   Avoid straining pelvic floor, abdominal muscles , spine  Use log rolling technique instead of getting out of bed with your neck or the sit-up     Log rolling into and out of bed   Log rolling into and out of bed If getting out of bed on R side, Bent knees, scoot hips/ shoulder to L  Raise R arm completely overhead, rolling onto armpit  Then lower bent knees to bed to get into complete  side lying position  Then drop legs off bed, and push up onto R elbow/forearm, and use L hand to push onto the bed

## 2018-12-23 NOTE — Therapy (Signed)
[place] MAIN [facility] SERVICES [address] [place], [place], [address] Phone: [phone]   Fax:  [phone]  Physical Therapy Treatment  Patient Details  Name: Kathleen Yu MRN: [id number] Date of Birth: 1955/04/27 No data recorded  Encounter Date: 12/23/2018  PT End of Session - 12/23/18 0806    Visit Number  5    Date for PT Re-Evaluation  02/17/19    PT Start Time  0803    PT Stop Time  0850    PT Time Calculation (min)  47 min    Activity Tolerance  Patient tolerated treatment well;No increased pain    Behavior During Therapy  WFL for tasks assessed/performed       Past Medical History:  Diagnosis Date  . Abnormal liver function tests   . Allergy   . Anemia    H/O  . Arthritis    RA  . Depression   . Fatty liver   . Hypercholesterolemia   . Hypertension   . Migraine headache    better since menopause  . Neuromuscular disorder ([place])   . Recurrent sinusitis    allergies  . Tuberculosis 1980'S   + TB TEST-WAS TREATED    Past Surgical History:  Procedure Laterality Date  . BILATERAL CARPAL TUNNEL RELEASE    . BREAST BIOPSY Left 2004   x 2  . BREAST BIOPSY Left 05/12/2017   FRAGMENTS WITH FEATURES OF AN EARLY INTRADUCTAL PAPILLOMA WITH aprocine changes  . BREAST BIOPSY Left 06/12/2017   Procedure: BREAST BIOPSY;  Surgeon: Robert Bellow, MD;  Location: [facility];  Service: General;  Laterality: Left;  . BREAST DUCTAL SYSTEM EXCISION Left 06/12/2017   Procedure: EXCISION DUCTAL SYSTEM BREAST;  Surgeon: Robert Bellow, MD;  Location: [facility];  Service: General;  Laterality: Left;  . COLONOSCOPY WITH PROPOFOL N/A 02/11/2016   Procedure: COLONOSCOPY WITH PROPOFOL;  Surgeon: Lucilla Lame, MD;  Location: [place];  Service: Endoscopy;  Laterality: N/A;  . EXCISION MORTON'S NEUROMA Left 06/01/2015   Procedure: EXCISION MORTON'S NEUROMA;  Surgeon: Sharlotte Alamo, DPM;  Location: [facility];  Service: Podiatry;   Laterality: Left;  Local w/ mac  . FRACTURE SURGERY  2017  . OSTECTOMY Left 06/01/2015   Procedure: OSTECTOMY/ part. excision fracture fragment left 4th toe;  Surgeon: Sharlotte Alamo, DPM;  Location: [facility];  Service: Podiatry;  Laterality: Left;  . POLYPECTOMY N/A 02/11/2016   Procedure: POLYPECTOMY;  Surgeon: Lucilla Lame, MD;  Location: [place];  Service: Endoscopy;  Laterality: N/A;  . RHINOPLASTY    . TUBAL LIGATION  1997    There were no vitals filed for this visit.  Subjective Assessment - 12/23/18 0806    Subjective  Pt is trying out sneakers to wear at work instead of the [employer] . Pt had no L hip pain across the past 2 weeks and was able to host her son's wedding without pain during and after.    Pertinent History  Fall on her L side on carport/ kitchen steps 3 years ago, RA, Morton's Neuroma Surgery on L foot, Surgery on L foot 4th toe to remove bone chip. Pt wore a post op shoe for 6 weeks. Pt does not have LBP.    Patient Stated Goals  to hurt when sitting or sleeping         [facility] PT Assessment - 12/23/18 0817      Observation/Other Assessments   Observations  full bridge with excessive  pelvic perturbation,  knee pain after 20 reps of both exercise, no knee pain with feet propped on stool to decrease knee flexion       Coordination   Coordination and Movement Description  posterior sling: perturbation/ 3+/5 LE/UE B , multifidis delayed,       Strength   Overall Strength Comments  posterior sling:  3+/5 LE/UE B , hip ext 4-5 L, 3+/5 R, midtrap/rhomboids 3+/5 B  ,      Bed Mobility   Bed Mobility  --   head lift, half logrolling                  [facility] Adult PT Treatment/Exercise - 12/23/18 9024      Neuro Re-ed    Neuro Re-ed Details   excessive cues for thoracolumbar strengthening with bands,                   PT Long Term Goals - 12/23/18 0973      PT LONG TERM GOAL #1   Title  Pt will demo equal alignment of iliac crest across  2 weeks in order to sleep on L side without interruption of pain to improve quality of sleep    Time  4    Period  Weeks    Status  Achieved      PT LONG TERM GOAL #2   Title  Pt will demo no imbalance of mm tightness along L QL, restored mobility at thoracic spine, proper lower kinetic chain co-activation in order to participate in line dance and perform the routine pt used to do without L and R hip burning    Time  6    Period  Weeks    Status  Achieved      PT LONG TERM GOAL #3   Title  Pt will apply biomechanics / ergonomics at her work station in order to sit at work without pain    Time  2    Period  Weeks    Status  Achieved      PT LONG TERM GOAL #4   Title  Pt will demo decreased score on [place] from 83% to < 53% in order to improve QOL  ( 9/10: 14%)    Time  10    Period  Weeks    Status  Achieved      PT LONG TERM GOAL #5   Title  Pt will no longer need to sleep in her recliner across 2 weeks in order to optimize quality of sleep    Time  8    Period  Weeks    Status  Achieved      PT LONG TERM GOAL #6   Title  Pt will report no L hip pain across 1 month in order to return to dancing    Time  8    Period  Weeks    Status  Achieved      PT LONG TERM GOAL #7   Title  Pt will demo proper techniques and alignment with fitness/ gym routine in order to maintain wellness without relapse of L hip pain and minimize injuries    Time  8    Period  Weeks    Status  New            Plan - 12/23/18 5329    Clinical Impression Statement  Pt continues to have no L hip pain and has returned to activities ( including hostessing son's  wedding and linedancing 1.5x/ week)  and being on her feet without reoccurence of pain. Progressed to thoracolumbar strengthening due to weakness in posterior chain. Pt continues to benefit from skilled PT.    Personal Factors and Comorbidities  Comorbidity 2    Comorbidities  Arthritis, HBP, High Cholesterol,    Examination-Participation  Restrictions  Other    Stability/Clinical Decision Making  Evolving/Moderate complexity    Rehab Potential  Good    PT Frequency  Biweekly    PT Duration  Other (comment)   10   PT Treatment/Interventions  Balance training;Neuromuscular re-education;Stair training;Moist Heat;Functional mobility training;Therapeutic activities;Therapeutic exercise;Patient/family education;Manual techniques;Scar mobilization;Energy conservation;Manual lymph drainage;Taping;ADLs/Self Care Home Management    Consulted and Agree with Plan of Care  Patient       Patient will benefit from skilled therapeutic intervention in order to improve the following deficits and impairments:  Abnormal gait, Improper body mechanics, Pain, Increased muscle spasms, Postural dysfunction, Decreased coordination, Decreased strength, Decreased endurance, Decreased activity tolerance, Hypomobility, Difficulty walking, Decreased safety awareness, Decreased balance, Decreased range of motion  Visit Diagnosis: Other abnormalities of gait and mobility  Sacrococcygeal disorders, not elsewhere classified  Other muscle spasm  Other lack of coordination     Problem List Patient Active Problem List   Diagnosis Date Noted  . Type 2 diabetes mellitus with hyperglycemia ([place]) 11/28/2018  . Left hip pain 11/06/2018  . Abnormal mammogram 02/22/2018  . Mass of upper outer quadrant of left breast 06/04/2017  . 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 History of colonic polyps 02/13/2016  . Special screening for malignant neoplasms, colon   . Benign neoplasm of ascending colon   . Rectal polyp   . Abdominal pain 12/01/2015  . Hyperglycemia 08/07/2014  . Health care maintenance 05/04/2014  . Adult BMI 37.0-37.9 kg/sq m 05/04/2014  . Foot pain 11/22/2013  . Post-menopausal bleeding 07/13/2012  . Hypertension 03/18/2012  . Hypercholesterolemia 03/18/2012  . Abnormal liver function tests 03/18/2012  . Migraine headache 03/18/2012     Jerl Mina ,PT, DPT, E-RYT  12/23/2018, 8:56 AM  [place] MAIN [facility] SERVICES [address] [place], [place], [address] Phone: [phone]   Fax:  [phone]  Name: MAGNOLIA MATTILA MRN: [id number] Date of Birth: [birth year]

## 2018-12-30 ENCOUNTER — Encounter: Payer: 59 | Admitting: Physical Therapy

## 2019-01-03 ENCOUNTER — Other Ambulatory Visit: Payer: Self-pay

## 2019-01-03 ENCOUNTER — Ambulatory Visit: Payer: 59 | Attending: Internal Medicine | Admitting: Physical Therapy

## 2019-01-03 ENCOUNTER — Encounter: Payer: Self-pay | Admitting: *Deleted

## 2019-01-03 ENCOUNTER — Encounter: Payer: 59 | Admitting: *Deleted

## 2019-01-03 VITALS — BP 134/80

## 2019-01-03 VITALS — BP 110/80 | Ht 62.0 in | Wt 196.5 lb

## 2019-01-03 DIAGNOSIS — M62838 Other muscle spasm: Secondary | ICD-10-CM | POA: Insufficient documentation

## 2019-01-03 DIAGNOSIS — M533 Sacrococcygeal disorders, not elsewhere classified: Secondary | ICD-10-CM | POA: Insufficient documentation

## 2019-01-03 DIAGNOSIS — E119 Type 2 diabetes mellitus without complications: Secondary | ICD-10-CM

## 2019-01-03 DIAGNOSIS — R2689 Other abnormalities of gait and mobility: Secondary | ICD-10-CM | POA: Diagnosis not present

## 2019-01-03 DIAGNOSIS — R278 Other lack of coordination: Secondary | ICD-10-CM | POA: Diagnosis not present

## 2019-01-03 NOTE — Progress Notes (Signed)
Diabetes Self-Management Education  Visit Type: First/Initial  Appt. Start Time: 1110 Appt. End Time: 1210  01/03/2019  [name], identified by name and date of birth, is a 63 y.o. female with a diagnosis of Diabetes: Type 2.   ASSESSMENT  Blood pressure 110/80, height 5\' 2"  (1.575 m), weight 196 lb 8 oz (89.1 kg) Body mass index is 35.94 kg/m.  Diabetes Self-Management Education - 01/03/19 1603      Visit Information   Visit Type  First/Initial      Initial Visit   Diabetes Type  Type 2    Are you currently following a meal plan?  Yes    What type of meal plan do you follow?  "following some handout that Dr Nicki Reaper gave me"    Are you taking your medications as prescribed?  Yes    Date Diagnosed  Pt reports that she was prediabetic - discussed ADA diagnosis based on A1C (pt was 6.7% in March and now 6.8 %)      Health Coping   How would you rate your overall health?  Good      Psychosocial Assessment   Patient Belief/Attitude about Diabetes  Other (comment)   "don't think a problem - hopefully can do diet control"   Self-care barriers  None    Self-management support  Doctor's office;Family    Patient Concerns  Nutrition/Meal planning;Monitoring;Weight Control;Healthy Lifestyle    Special Needs  None    Preferred Learning Style  Auditory;Visual;Hands on    [place] in progress    How often do you need to have someone help you when you read instructions, pamphlets, or other written materials from your doctor or pharmacy?  1 - Never    What is the last grade level you completed in school?  RN diploma and some college      Pre-Education Assessment   Patient understands the diabetes disease and treatment process.  Needs Review    Patient understands incorporating nutritional management into lifestyle.  Needs Review    Patient undertands incorporating physical activity into lifestyle.  Needs Review    Patient understands using medications safely.   Needs Review    Patient understands monitoring blood glucose, interpreting and using results  Needs Review    Patient understands prevention, detection, and treatment of acute complications.  Needs Review    Patient understands prevention, detection, and treatment of chronic complications.  Needs Review    Patient understands how to develop strategies to address psychosocial issues.  Needs Review    Patient understands how to develop strategies to promote health/change behavior.  Needs Review      Complications   Last HgB A1C per patient/outside source  6.8 %   11/11/2018   How often do you check your blood sugar?  3-4 times / week   Pt reports checking 1 x week at work   Fasting Blood glucose range (mg/dL)  70-129   She reports taking BG during her night shift before a meal with readings of 86-99 mg/dL.   Have you had a dilated eye exam in the past 12 months?  Yes    Have you had a dental exam in the past 12 months?  Yes    Are you checking your feet?  Yes    How many days per week are you checking your feet?  7      Dietary Intake   Breakfast  9:00 am - cereal and milk; occasional eggs  and cheese; if working 1:00 am - soup    Snack (morning)  2 snacks/day - pickles, celery, carrots; fruit every other day - apples, peaches, plums, bananas    Lunch  1-2 pm - soup, noodles, occasional sandwich; if working she would be sleeping    Dinner  5:30-6 pm whether at work or home - beef, chicken, pork, fish with potaotes, corn, beans, green beans, pasta, rice, cuccumbers, lettuce, tomatoes, spinach, cauliflower, broccoli, squash, okra    Beverage(s)  water, coffee, cranberry juice      Exercise   Exercise Type  Light (walking / raking leaves)    How many days per week to you exercise?  3    How many minutes per day do you exercise?  30    Total minutes per week of exercise  90      Patient Education   Previous Diabetes Education  Yes (please comment)   nursing school   Disease state   Factors  that contribute to the development of diabetes;Explored patient's options for treatment of their diabetes    Nutrition management   Role of diet in the treatment of diabetes and the relationship between the three main macronutrients and blood glucose level;Food label reading, portion sizes and measuring food.;Reviewed blood glucose goals for pre and post meals and how to evaluate the patients' food intake on their blood glucose level.    Physical activity and exercise   Role of exercise on diabetes management, blood pressure control and cardiac health.    Medications  Reviewed patients medication for diabetes, action, purpose, timing of dose and side effects.    Monitoring  Purpose and frequency of SMBG.;Taught/discussed recording of test results and interpretation of SMBG.;Identified appropriate SMBG and/or A1C goals.    Chronic complications  Relationship between chronic complications and blood glucose control    Psychosocial adjustment  Identified and addressed patients feelings and concerns about diabetes      Individualized Goals (developed by patient)   Reducing Risk Prevent diabetes complications Lose weight Lead a healthier lifestyle Become more fit     Outcomes   Expected Outcomes  Demonstrated interest in learning. Expect positive outcomes       Individualized Plan for Diabetes Self-Management Training:   Learning Objective:  Patient will have a greater understanding of diabetes self-management. Patient education plan is to attend individual and/or group sessions per assessed needs and concerns.   Plan:   Patient Instructions  Check blood sugars before breakfast and 2 hrs after one meal 3 x week Bring blood sugar records to the next class Exercise: Continue walking for  30 minutes 2-3 days a week and gradually increase to 30 minutes 5 x week Eat 3 meals day,  1-2  snacks a day Space meals 4-6 hours apart Don't skip meals Limit sugar sweetened drinks (juices)  Expected  Outcomes:  Demonstrated interest in learning. Expect positive outcomes  Education material provided:  General Meal Planning Guidelines Simple Meal Plan  If problems or questions, patient to contact team via:   Johny Drilling, RN, CCM, CDE [phone]  Future DSME appointment:  Pt to check her work calendar and call back to schedule classes.

## 2019-01-03 NOTE — Patient Instructions (Addendum)
Timer to turn off TV2 hours before going to bed/ sleep    [place] routine   To avoid falling asleep in recliner   ____  Heel raises 20 reps   Complimentary Calf stretch 1) front ballmound on wall                      2) front foot down, back leg hip width apart, ski track stance, knee bent     ____   Previous band exercises: Stabilize all points of contact when bridging up  Lift buttocks up before the point of shakiness  Make sure to lower fist down

## 2019-01-03 NOTE — Therapy (Signed)
[place] MAIN [facility] SERVICES [address] [place], [place], [address] Phone: [phone]   Fax:  [phone]  Physical Therapy Treatment  Patient Details  Name: Kathleen Yu MRN: [id number] Date of Birth: 1956/02/24 No data recorded  Encounter Date: 01/03/2019  PT End of Session - 01/03/19 0810    Visit Number  6    Date for PT Re-Evaluation  02/17/19    PT Start Time  0809    PT Stop Time  0905    PT Time Calculation (min)  56 min    Activity Tolerance  Patient tolerated treatment well;No increased pain    Behavior During Therapy  WFL for tasks assessed/performed       Past Medical History:  Diagnosis Date  . Abnormal liver function tests   . Allergy   . Anemia    H/O  . Arthritis    RA  . Depression   . Diabetes mellitus without complication ([place])   . Fatty liver   . Hypercholesterolemia   . Hypertension   . Migraine headache    better since menopause  . Neuromuscular disorder ([place])   . Recurrent sinusitis    allergies  . Tuberculosis 1980'S   + TB TEST-WAS TREATED    Past Surgical History:  Procedure Laterality Date  . BILATERAL CARPAL TUNNEL RELEASE    . BREAST BIOPSY Left 2004   x 2  . BREAST BIOPSY Left 05/12/2017   FRAGMENTS WITH FEATURES OF AN EARLY INTRADUCTAL PAPILLOMA WITH aprocine changes  . BREAST BIOPSY Left 06/12/2017   Procedure: BREAST BIOPSY;  Surgeon: Robert Bellow, MD;  Location: [facility];  Service: General;  Laterality: Left;  . BREAST DUCTAL SYSTEM EXCISION Left 06/12/2017   Procedure: EXCISION DUCTAL SYSTEM BREAST;  Surgeon: Robert Bellow, MD;  Location: [facility];  Service: General;  Laterality: Left;  . COLONOSCOPY WITH PROPOFOL N/A 02/11/2016   Procedure: COLONOSCOPY WITH PROPOFOL;  Surgeon: Lucilla Lame, MD;  Location: [place];  Service: Endoscopy;  Laterality: N/A;  . EXCISION MORTON'S NEUROMA Left 06/01/2015   Procedure: EXCISION MORTON'S NEUROMA;  Surgeon: Sharlotte Alamo,  DPM;  Location: [facility];  Service: Podiatry;  Laterality: Left;  Local w/ mac  . FRACTURE SURGERY  2017  . OSTECTOMY Left 06/01/2015   Procedure: OSTECTOMY/ part. excision fracture fragment left 4th toe;  Surgeon: Sharlotte Alamo, DPM;  Location: [facility];  Service: Podiatry;  Laterality: Left;  . POLYPECTOMY N/A 02/11/2016   Procedure: POLYPECTOMY;  Surgeon: Lucilla Lame, MD;  Location: [place];  Service: Endoscopy;  Laterality: N/A;  . RHINOPLASTY    . TUBAL LIGATION  1997    Vitals:   01/03/19 0913  BP: 134/80    Subjective Assessment - 01/03/19 0809    Subjective  Pt has had no issues. Pt was only able to practice the band exericses 3x. Pt falls asleep in her recliner and wakes up to pee. Then she goes to the bed.    Pertinent History  Fall on her L side on carport/ kitchen steps 3 years ago, RA, Morton's Neuroma Surgery on L foot, Surgery on L foot 4th toe to remove bone chip. Pt wore a post op shoe for 6 weeks. Pt does not have LBP.    Patient Stated Goals  to hurt when sitting or sleeping         [facility] PT Assessment - 01/03/19 0826      Other:   Other/ Comments  golfing swing with posterior weightbearing/ posterior LOB but self corrected .  Facial grimace reaction to her back.  Pt voiced understanding on anterior COM        Strength   Overall Strength Comments  single UE on wall: PF standing: 4/5  L 20reps, R 30 reps  ( L foot with more supination, genu valgus  and difficulty)                    [facility] Adult PT Treatment/Exercise - 01/03/19 0849      Therapeutic Activites    Other Therapeutic Activities  cued for golf swing with more anterior COM,       Neuro Re-ed    Neuro Re-ed Details   cued for more pelvic stability w/ bridges in band exercise, cued stand> floor                   PT Long Term Goals - 01/03/19 0930      PT LONG TERM GOAL #1   Title  Pt will demo equal alignment of iliac crest across 2 weeks in order to sleep on L side  without interruption of pain to improve quality of sleep    Time  4    Period  Weeks    Status  Achieved      PT LONG TERM GOAL #2   Title  Pt will demo no imbalance of mm tightness along L QL, restored mobility at thoracic spine, proper lower kinetic chain co-activation in order to participate in line dance and perform the routine pt used to do without L and R hip burning    Time  6    Period  Weeks    Status  Achieved      PT LONG TERM GOAL #3   Title  Pt will apply biomechanics / ergonomics at her work station in order to sit at work without pain    Time  2    Period  Weeks    Status  Achieved      PT LONG TERM GOAL #4   Title  Pt will demo decreased score on [place] from 83% to < 53% in order to improve QOL  ( 9/10: 14%)    Time  10    Period  Weeks    Status  Achieved      PT LONG TERM GOAL #5   Title  Pt will no longer need to sleep in her recliner across 2 weeks in order to optimize quality of sleep    Time  8    Period  Weeks    Status  Achieved      Additional Long Term Goals   Additional Long Term Goals  Yes      PT LONG TERM GOAL #6   Title  Pt will report no L hip pain across 1 month in order to return to dancing    Time  8    Period  Weeks    Status  Achieved      PT LONG TERM GOAL #7   Title  Pt will demo proper techniques and alignment with fitness/ gym routine in order to maintain wellness without relapse of L hip pain and minimize injuries    Time  8    Period  Weeks    Status  New      PT LONG TERM GOAL #8   Title  Pt will demo increase PF with single UE from20  reps on L to > 25 reps Grade MMT 4/5 without genu valgus in order to play golf and linedance with less risk for injuries    Time  4    Period  Weeks    Status  New    Target Date  01/31/19            Plan - 01/03/19 1048    Clinical Impression Statement  Pt required cues for proper technique in thoracolumbar band exercises given last week. Pt also was educate don proper technique with  more propioception on ballmounds of feet when swinging golf club because pt plans to return to golf.  Pt reported feeling a hot flash after the band exercises requiring a cup of cold water . BP was 134/80 which was higher than normal. Pt reported feeling she was cooling down before leaving session. Pt continues to benefit from skilled PT    Personal Factors and Comorbidities  Comorbidity 2    Comorbidities  Arthritis, HBP, High Cholesterol,    Examination-Participation Restrictions  Other    Stability/Clinical Decision Making  Evolving/Moderate complexity    Rehab Potential  Good    PT Frequency  Biweekly    PT Duration  Other (comment)   10   PT Treatment/Interventions  Balance training;Neuromuscular re-education;Stair training;Moist Heat;Functional mobility training;Therapeutic activities;Therapeutic exercise;Patient/family education;Manual techniques;Scar mobilization;Energy conservation;Manual lymph drainage;Taping;ADLs/Self Care Home Management    Consulted and Agree with Plan of Care  Patient       Patient will benefit from skilled therapeutic intervention in order to improve the following deficits and impairments:  Abnormal gait, Improper body mechanics, Pain, Increased muscle spasms, Postural dysfunction, Decreased coordination, Decreased strength, Decreased endurance, Decreased activity tolerance, Hypomobility, Difficulty walking, Decreased safety awareness, Decreased balance, Decreased range of motion  Visit Diagnosis: Other abnormalities of gait and mobility  Sacrococcygeal disorders, not elsewhere classified  Other muscle spasm  Other lack of coordination     Problem List Patient Active Problem List   Diagnosis Date Noted  . Type 2 diabetes mellitus with hyperglycemia ([place]) 11/28/2018  . Left hip pain 11/06/2018  . Abnormal mammogram 02/22/2018  . Mass of upper outer quadrant of left breast 06/04/2017  . 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 History of colonic polyps  02/13/2016  . Special screening for malignant neoplasms, colon   . Benign neoplasm of ascending colon   . Rectal polyp   . Abdominal pain 12/01/2015  . Hyperglycemia 08/07/2014  . Health care maintenance 05/04/2014  . Adult BMI 37.0-37.9 kg/sq m 05/04/2014  . Foot pain 11/22/2013  . Post-menopausal bleeding 07/13/2012  . Hypertension 03/18/2012  . Hypercholesterolemia 03/18/2012  . Abnormal liver function tests 03/18/2012  . Migraine headache 03/18/2012    Jerl Mina ,PT, DPT, E-RYT  01/03/2019, 2:55 PM  [place] MAIN [facility] SERVICES [address] [place], [place], [address] Phone: [phone]   Fax:  [phone]  Name: Kathleen Yu MRN: [id number] Date of Birth: [birth year]

## 2019-01-03 NOTE — Patient Instructions (Signed)
Check blood sugars before breakfast and 2 hrs after one meal 3 x week Bring blood sugar records to the next class  Exercise: Continue walking for  30 minutes 2-3 days a week and gradually increase to 30 minutes 5 x week  Eat 3 meals day,  1-2  snacks a day Space meals 4-6 hours apart Don't skip meals Limit sugar sweetened drinks (juices)  Return for classes on:

## 2019-01-05 ENCOUNTER — Encounter: Payer: Self-pay | Admitting: Internal Medicine

## 2019-01-06 ENCOUNTER — Telehealth: Payer: Self-pay | Admitting: *Deleted

## 2019-01-06 NOTE — Telephone Encounter (Signed)
Received voicemail from patient regarding diabetes classes. She will start Oct 15 for Class 1. Due to her work schedule she will attend Class 3 on Oct 29 and Class 2 on Nov 12.

## 2019-01-10 ENCOUNTER — Ambulatory Visit: Payer: 59 | Admitting: Physical Therapy

## 2019-01-13 ENCOUNTER — Encounter: Payer: 59 | Admitting: Dietician

## 2019-01-13 ENCOUNTER — Encounter: Payer: Self-pay | Admitting: Dietician

## 2019-01-13 ENCOUNTER — Other Ambulatory Visit: Payer: Self-pay

## 2019-01-13 VITALS — Ht 62.0 in | Wt 194.6 lb

## 2019-01-13 DIAGNOSIS — E119 Type 2 diabetes mellitus without complications: Secondary | ICD-10-CM

## 2019-01-13 NOTE — Progress Notes (Signed)
Appt. Start Time: 0900 Appt. End Time: 1120  Class 1 Diabetes Overview - define DM; state own type of DM; identify functions of pancreas and insulin; define insulin deficiency vs insulin resistance  Psychosocial - identify DM as a source of stress; state the effects of stress on BG control  Nutritional Management - describe effects of food on blood glucose; identify sources of carbohydrate, protein and fat; verbalize the importance of balance meals in controlling blood glucose  Exercise - describe the effects of exercise on blood glucose and importance of regular exercise in controlling diabetes; state a plan for personal exercise; verbalize contraindications for exercise  Self-Monitoring - state importance of SMBG; use SMBG results to effectively manage diabetes; identify importance of regular HbA1C testing and goals for results  Acute Complications - recognize hyperglycemia and hypoglycemia with causes and effects; identify blood glucose results as high, low or in control; list steps in treating and preventing high and low blood glucose  Chronic Complications/Foot, Skin, Eye Dental Care - identify possible long-term complications of diabetes (retinopathy, neuropathy, nephropathy, cardiovascular disease, infections); state importance of daily self-foot exams; describe how to examine feet and what to look for; explain appropriate eye and dental care  Lifestyle Changes/Goals & Health/Community Resources - state benefits of making appropriate lifestyle changes; identify habits that need to change (meals, tobacco, alcohol); identify strategies to reduce risk factors for personal health  Pregnancy/Sexual Health - define gestational diabetes; state importance of good blood glucose control and birth control prior to pregnancy; state importance of good blood glucose control in preventing sexual problems (impotence, vaginal dryness, infections, loss of desire); state relationship of blood glucose control  and pregnancy outcome; describe risk of maternal and fetal complications  Teaching Materials Used: Class 1 Slides/Notebook Diabetes Booklet ID Card  Medic Alert/Medic ID Forms Sleep Evaluation Exercise Handout Planning a Balanced Meal Goals for Class 1

## 2019-01-17 ENCOUNTER — Other Ambulatory Visit: Payer: Self-pay

## 2019-01-17 ENCOUNTER — Ambulatory Visit: Payer: 59 | Admitting: Physical Therapy

## 2019-01-17 ENCOUNTER — Encounter: Payer: 59 | Admitting: Physical Therapy

## 2019-01-17 DIAGNOSIS — R2689 Other abnormalities of gait and mobility: Secondary | ICD-10-CM

## 2019-01-17 DIAGNOSIS — M533 Sacrococcygeal disorders, not elsewhere classified: Secondary | ICD-10-CM

## 2019-01-17 DIAGNOSIS — M62838 Other muscle spasm: Secondary | ICD-10-CM

## 2019-01-17 DIAGNOSIS — R278 Other lack of coordination: Secondary | ICD-10-CM | POA: Diagnosis not present

## 2019-01-17 NOTE — Patient Instructions (Signed)
Stretches for back:  childs poses rocking with pillow under belly if need more support/ relaxation  childs poses rocking  Toes tucked, shoulders down and back, on forearms , hands shoulder width apart 10 reps   Side rib stretch  ( at work)  Stand perpendicular to wall  Posterior tilt of pelvis Stretch arm up and down to feel rib rib lengthen 10 reps    Thread the needle standing  (at work)   Standing, knee bent,  hands shoulder width apart on table/ chair  L hand on R thigh, look under R armpit Repeat on this side for 5 reps,.  Switch    ____  Strengthening posterior core muscles: multifidis   Multifidis twist  Seated,  Band is on doorknob: stand further away from door (facing perpendicular)   Twisting trunk without moving the hips and knees Hold band at the level of ribcage, elbows bent,shoulder blades roll back and down like squeezing a pencil under armpit    Exhale twist,.10-15 deg away from door without moving your hips/ knees. Continue to maintain equal weight through legs. Keep knee unlocked.  10 x 2   ____

## 2019-01-17 NOTE — Therapy (Addendum)
[place] MAIN [facility] SERVICES [address] [place], [place], [address] Phone: [phone]   Fax:  [phone]  Physical Therapy Treatment / Discharge Summary    Patient Details  Name: Kathleen Yu MRN: [id number] Date of Birth: Aug 23, 1955 No data recorded  Encounter Date: 01/17/2019  PT End of Session - 01/17/19 0821    Visit Number  7    Date for PT Re-Evaluation  02/17/19    PT Start Time  0801    PT Stop Time  0845    PT Time Calculation (min)  44 min    Activity Tolerance  Patient tolerated treatment well;No increased pain    Behavior During Therapy  WFL for tasks assessed/performed       Past Medical History:  Diagnosis Date  . Abnormal liver function tests   . Allergy   . Anemia    H/O  . Arthritis    RA  . Depression   . Diabetes mellitus without complication ([place])   . Fatty liver   . Hypercholesterolemia   . Hypertension   . Migraine headache    better since menopause  . Neuromuscular disorder ([place])   . Recurrent sinusitis    allergies  . Tuberculosis 1980'S   + TB TEST-WAS TREATED    Past Surgical History:  Procedure Laterality Date  . BILATERAL CARPAL TUNNEL RELEASE    . BREAST BIOPSY Left 2004   x 2  . BREAST BIOPSY Left 05/12/2017   FRAGMENTS WITH FEATURES OF AN EARLY INTRADUCTAL PAPILLOMA WITH aprocine changes  . BREAST BIOPSY Left 06/12/2017   Procedure: BREAST BIOPSY;  Surgeon: Robert Bellow, MD;  Location: [facility];  Service: General;  Laterality: Left;  . BREAST DUCTAL SYSTEM EXCISION Left 06/12/2017   Procedure: EXCISION DUCTAL SYSTEM BREAST;  Surgeon: Robert Bellow, MD;  Location: [facility];  Service: General;  Laterality: Left;  . COLONOSCOPY WITH PROPOFOL N/A 02/11/2016   Procedure: COLONOSCOPY WITH PROPOFOL;  Surgeon: Lucilla Lame, MD;  Location: [place];  Service: Endoscopy;  Laterality: N/A;  . EXCISION MORTON'S NEUROMA Left 06/01/2015   Procedure: EXCISION MORTON'S NEUROMA;   Surgeon: Sharlotte Alamo, DPM;  Location: [facility];  Service: Podiatry;  Laterality: Left;  Local w/ mac  . FRACTURE SURGERY  2017  . OSTECTOMY Left 06/01/2015   Procedure: OSTECTOMY/ part. excision fracture fragment left 4th toe;  Surgeon: Sharlotte Alamo, DPM;  Location: [facility];  Service: Podiatry;  Laterality: Left;  . POLYPECTOMY N/A 02/11/2016   Procedure: POLYPECTOMY;  Surgeon: Lucilla Lame, MD;  Location: [place];  Service: Endoscopy;  Laterality: N/A;  . RHINOPLASTY    . TUBAL LIGATION  1997    There were no vitals filed for this visit.  Subjective Assessment - 01/17/19 0808    Subjective  Pt reported she showed her dtr and future son-in-law who said he thinks his mom who is 20 years younger might not be able to do the downward facing dog stretch.  Pt made sure she ate this morning because at her last session pt did not eat anything which caused her to almost pass out.    Pertinent History  Fall on her L side on carport/ kitchen steps 3 years ago, RA, Morton's Neuroma Surgery on L foot, Surgery on L foot 4th toe to remove bone chip. Pt wore a post op shoe for 6 weeks. Pt does not have LBP.    Patient Stated Goals  to hurt when sitting  or sleeping         [facility] PT Assessment - 01/17/19 0822      Observation/Other Assessments   Observations  increased anterior tilt ini new HEP, downgraded exercise to minimize lumbar lordosis       Coordination   Coordination and Movement Description  glut-hamstring-multifidis firing in B       Strength   Overall Strength  --   PF w/ single UE 25 reps B MMT 4/5.    Overall Strength Comments  posterior sling test:  B 5/5/ minimal perturbation .                    [place] Adult PT Treatment/Exercise - 01/17/19 2947      Therapeutic Activites    Other Therapeutic Activities  reassessed goals                   PT Long Term Goals - 01/17/19 0827      PT LONG TERM GOAL #1   Title  Pt will demo equal alignment of iliac  crest across 2 weeks in order to sleep on L side without interruption of pain to improve quality of sleep    Time  4    Period  Weeks    Status  Achieved      PT LONG TERM GOAL #2   Title  Pt will demo no imbalance of mm tightness along L QL, restored mobility at thoracic spine, proper lower kinetic chain co-activation in order to participate in line dance and perform the routine pt used to do without L and R hip burning    Time  6    Period  Weeks    Status  Achieved      PT LONG TERM GOAL #3   Title  Pt will apply biomechanics / ergonomics at her work station in order to sit at work without pain    Time  2    Period  Weeks    Status  Achieved      PT LONG TERM GOAL #4   Title  Pt will demo decreased score on [place] from 83% to < 53% in order to improve QOL  ( 9/10: 14%)    Time  10    Period  Weeks    Status  Achieved      PT LONG TERM GOAL #5   Title  Pt will no longer need to sleep in her recliner across 2 weeks in order to optimize quality of sleep    Time  8    Period  Weeks    Status  Achieved      PT LONG TERM GOAL #6   Title  Pt will report no L hip pain across 1 month in order to return to dancing    Time  8    Period  Weeks    Status  Achieved      PT LONG TERM GOAL #7   Title  Pt will demo proper techniques and alignment with fitness/ gym routine in order to maintain wellness without relapse of L hip pain and minimize injuries    Time  8    Period  Weeks    Status  Achieved      PT LONG TERM GOAL #8   Title  Pt will demo increase PF with single UE from20 reps on L to > 25 reps Grade MMT 4/5 without genu valgus in order to play golf and  linedance with less risk for injuries    Time  4    Period  Weeks    Status  Achieved            Plan - 01/17/19 0827    Clinical Impression Statement Across the past 7 weeks, pt has achieved 100% of her goals with resolved L hip pain. She achieved the following improvements:   _Pt demo'd more equal alignment of her  pelvic girdle, less back mm/ thoracic, leg/ feet tensions, significantly improved less forward head posture, stronger thoracolumbar mm strength.   _Pt's Pain Disability Index score decreased from 83% to 14% which indicates signficant improvement in function.  _Pt is sleeping better without L hip pain with 85-95% improvement aspt demo'd good carry over with equal pelvic girdle alignment since the 1st treatment.   _Pt has been able to return to line dancing with better balance as pt demonstrated decreased tightness in feet and increased plantarflexion strength.  _Pt demo'd IND with biomechanics / ergonomics at her work station and reports being able to sit at work without pain.   _Pt also showed improved gait mechanics with co-activation of deep core and intrinsic feet muscles.   Pt is ready for d/c at this time. Thank you for your referral!       Personal Factors and Comorbidities  Comorbidity 2    Comorbidities  Arthritis, HBP, High Cholesterol,    Examination-Participation Restrictions  Other    Stability/Clinical Decision Making  Evolving/Moderate complexity    Rehab Potential  Good    PT Frequency  Biweekly    PT Duration  Other (comment)   10   PT Treatment/Interventions  Balance training;Neuromuscular re-education;Stair training;Moist Heat;Functional mobility training;Therapeutic activities;Therapeutic exercise;Patient/family education;Manual techniques;Scar mobilization;Energy conservation;Manual lymph drainage;Taping;ADLs/Self Care Home Management    Consulted and Agree with Plan of Care  Patient       Patient will benefit from skilled therapeutic intervention in order to improve the following deficits and impairments:  Abnormal gait, Improper body mechanics, Pain, Increased muscle spasms, Postural dysfunction, Decreased coordination, Decreased strength, Decreased endurance, Decreased activity tolerance, Hypomobility, Difficulty walking, Decreased safety awareness, Decreased  balance, Decreased range of motion  Visit Diagnosis: Other abnormalities of gait and mobility  Sacrococcygeal disorders, not elsewhere classified  Other muscle spasm  Other lack of coordination     Problem List Patient Active Problem List   Diagnosis Date Noted  . Type 2 diabetes mellitus with hyperglycemia ([place]) 11/28/2018  . Left hip pain 11/06/2018  . Abnormal mammogram 02/22/2018  . Mass of upper outer quadrant of left breast 06/04/2017  . 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 History of colonic polyps 02/13/2016  . Special screening for malignant neoplasms, colon   . Benign neoplasm of ascending colon   . Rectal polyp   . Abdominal pain 12/01/2015  . Hyperglycemia 08/07/2014  . Health care maintenance 05/04/2014  . Adult BMI 37.0-37.9 kg/sq m 05/04/2014  . Foot pain 11/22/2013  . Post-menopausal bleeding 07/13/2012  . Hypertension 03/18/2012  . Hypercholesterolemia 03/18/2012  . Abnormal liver function tests 03/18/2012  . Migraine headache 03/18/2012    Jerl Mina ,PT, DPT, E-RYT  01/17/2019, 8:52 AM  [place] MAIN [facility] SERVICES [address] [place], [place], [address] Phone: [phone]   Fax:  [phone]  Name: PRIYANKA CAUSEY MRN: [id number] Date of Birth: [birth year]

## 2019-01-27 ENCOUNTER — Other Ambulatory Visit: Payer: Self-pay

## 2019-01-27 ENCOUNTER — Encounter: Payer: Self-pay | Admitting: Dietician

## 2019-01-27 ENCOUNTER — Encounter: Payer: 59 | Admitting: Dietician

## 2019-01-27 VITALS — BP 104/70 | Ht 62.0 in | Wt 194.6 lb

## 2019-01-27 DIAGNOSIS — E119 Type 2 diabetes mellitus without complications: Secondary | ICD-10-CM

## 2019-01-27 DIAGNOSIS — R2689 Other abnormalities of gait and mobility: Secondary | ICD-10-CM | POA: Diagnosis not present

## 2019-01-27 DIAGNOSIS — M62838 Other muscle spasm: Secondary | ICD-10-CM | POA: Diagnosis not present

## 2019-01-27 DIAGNOSIS — R278 Other lack of coordination: Secondary | ICD-10-CM | POA: Diagnosis not present

## 2019-01-27 DIAGNOSIS — M533 Sacrococcygeal disorders, not elsewhere classified: Secondary | ICD-10-CM | POA: Diagnosis not present

## 2019-01-27 NOTE — Progress Notes (Signed)

## 2019-01-31 ENCOUNTER — Other Ambulatory Visit: Payer: Self-pay | Admitting: Internal Medicine

## 2019-02-10 ENCOUNTER — Encounter: Payer: 59 | Attending: Internal Medicine | Admitting: *Deleted

## 2019-02-10 ENCOUNTER — Encounter: Payer: Self-pay | Admitting: *Deleted

## 2019-02-10 ENCOUNTER — Other Ambulatory Visit: Payer: Self-pay

## 2019-02-10 VITALS — Wt 192.5 lb

## 2019-02-10 DIAGNOSIS — E119 Type 2 diabetes mellitus without complications: Secondary | ICD-10-CM

## 2019-02-10 NOTE — Progress Notes (Signed)

## 2019-02-15 ENCOUNTER — Encounter: Payer: Self-pay | Admitting: *Deleted

## 2019-03-07 ENCOUNTER — Telehealth: Payer: Self-pay | Admitting: Internal Medicine

## 2019-03-07 ENCOUNTER — Other Ambulatory Visit: Payer: Self-pay

## 2019-03-07 ENCOUNTER — Ambulatory Visit (INDEPENDENT_AMBULATORY_CARE_PROVIDER_SITE_OTHER): Payer: 59 | Admitting: Internal Medicine

## 2019-03-07 ENCOUNTER — Encounter: Payer: Self-pay | Admitting: Internal Medicine

## 2019-03-07 VITALS — BP 110/68 | Ht 62.0 in | Wt 193.0 lb

## 2019-03-07 DIAGNOSIS — Z1231 Encounter for screening mammogram for malignant neoplasm of breast: Secondary | ICD-10-CM | POA: Diagnosis not present

## 2019-03-07 DIAGNOSIS — Z7189 Other specified counseling: Secondary | ICD-10-CM

## 2019-03-07 DIAGNOSIS — I10 Essential (primary) hypertension: Secondary | ICD-10-CM | POA: Diagnosis not present

## 2019-03-07 DIAGNOSIS — R7989 Other specified abnormal findings of blood chemistry: Secondary | ICD-10-CM

## 2019-03-07 DIAGNOSIS — E7800 Pure hypercholesterolemia, unspecified: Secondary | ICD-10-CM

## 2019-03-07 DIAGNOSIS — Z7185 Encounter for immunization safety counseling: Secondary | ICD-10-CM

## 2019-03-07 DIAGNOSIS — R945 Abnormal results of liver function studies: Secondary | ICD-10-CM | POA: Diagnosis not present

## 2019-03-07 DIAGNOSIS — E119 Type 2 diabetes mellitus without complications: Secondary | ICD-10-CM | POA: Diagnosis not present

## 2019-03-07 MED ORDER — METFORMIN HCL ER 500 MG PO TB24: 500.0000 mg | ORAL_TABLET | Freq: Every day | ORAL | 2 refills | Status: DC

## 2019-03-07 MED ORDER — PAROXETINE HCL 10 MG PO TABS: 10.0000 mg | ORAL_TABLET | Freq: Every morning | ORAL | 1 refills | Status: DC

## 2019-03-07 MED ORDER — HYDROCHLOROTHIAZIDE 25 MG PO TABS: 25.0000 mg | ORAL_TABLET | Freq: Every day | ORAL | 1 refills | Status: DC

## 2019-03-07 MED ORDER — LISINOPRIL 40 MG PO TABS: 40.0000 mg | ORAL_TABLET | Freq: Every day | ORAL | 1 refills | Status: DC

## 2019-03-07 MED ORDER — AMLODIPINE BESYLATE 10 MG PO TABS: 10.0000 mg | ORAL_TABLET | Freq: Every day | ORAL | 1 refills | Status: DC

## 2019-03-07 NOTE — Progress Notes (Signed)
[name], female   DOB: 05-Apr-1955, 63 y.o.   MRN: [id number]   Virtual Visit via video Note  This visit type was conducted due to national recommendations for restrictions regarding the COVID-19 pandemic (e.g. social distancing).  This format is felt to be most appropriate for this patient at this time.  All issues noted in this document were discussed and addressed.  No physical exam was performed (except for noted visual exam findings with Video Visits).   I connected with Dalbert Batman by a video enabled telemedicine application and verified that I am speaking with the correct person using two identifiers. Location patient: home Location provider: work Persons participating in the virtual visit: patient, provider  I discussed the limitations, risks, security and privacy concerns of performing an evaluation and management service by video and the availability of in person appointments.  The patient expressed understanding and agreed to proceed.   Reason for visit: scheduled follow up.    HPI: She reports she is doing relatively well.  a1c last check 6.8.  She went to [employer].  Has been watching her diet.  Exercising more.  States sugars are ok.  Had two episodes when her sugar was in the 60s.  She did not eat for a long period of time when this occurred.  No other low sugars and states it is not a problem when she eats regularly.  No chest pain.  No sob.  No acid reflux.  No abdominal pain.  Since starting metformin, she is having loose stools.  Discussed side effects of metformin.  Discussed changing to ER form.  Discussed the recent recall of certain lot numbers.  She is agreeable to start.  Is up to date with colonoscopy.  Will schedule her own mammogram.  Hip and back doing well.  Son and his wife are going to have a baby end of February.    ROS: See pertinent positives and negatives per HPI.  Past Medical History:  Diagnosis Date  . Abnormal liver function tests    . Allergy   . Anemia    H/O  . Arthritis    RA  . Depression   . Diabetes mellitus without complication ([place])   . Fatty liver   . Hypercholesterolemia   . Hypertension   . Migraine headache    better since menopause  . Neuromuscular disorder ([place])   . Recurrent sinusitis    allergies  . Tuberculosis 1980'S   + TB TEST-WAS TREATED    Past Surgical History:  Procedure Laterality Date  . BILATERAL CARPAL TUNNEL RELEASE    . BREAST BIOPSY Left 2004   x 2  . BREAST BIOPSY Left 05/12/2017   FRAGMENTS WITH FEATURES OF AN EARLY INTRADUCTAL PAPILLOMA WITH aprocine changes  . BREAST BIOPSY Left 06/12/2017   Procedure: BREAST BIOPSY;  Surgeon: Robert Bellow, MD;  Location: [facility];  Service: General;  Laterality: Left;  . BREAST DUCTAL SYSTEM EXCISION Left 06/12/2017   Procedure: EXCISION DUCTAL SYSTEM BREAST;  Surgeon: Robert Bellow, MD;  Location: [facility];  Service: General;  Laterality: Left;  . COLONOSCOPY WITH PROPOFOL N/A 02/11/2016   Procedure: COLONOSCOPY WITH PROPOFOL;  Surgeon: Lucilla Lame, MD;  Location: [place];  Service: Endoscopy;  Laterality: N/A;  . 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 FRACTURE SURGERY  2017  . OSTECTOMY Left 06/01/2015   Procedure: OSTECTOMY/ part. excision fracture fragment left 4th toe;  Surgeon: Sharlotte Alamo, DPM;  Location: [facility];  Service: Podiatry;  Laterality: Left;  . POLYPECTOMY N/A 02/11/2016   Procedure: POLYPECTOMY;  Surgeon: Lucilla Lame, MD;  Location: [place];  Service: Endoscopy;  Laterality: N/A;  . RHINOPLASTY    . TUBAL LIGATION  1997    Family History  Problem Relation Age of Onset  . Heart disease Father        died age 68 (MI)  . Heart disease Other        4 uncles - CABG  . Colon cancer Other        great aunt  . Heart disease Paternal  Grandmother        first MI - 33s  . Migraines Mother   . Breast cancer Mother 68  . Hypertension Mother   . Hypertension Maternal Grandmother   . Early death Maternal Grandmother   . Diabetes Paternal Grandfather   . Stroke Paternal Grandfather   . Depression Maternal Aunt   . Breast cancer Maternal Aunt   . Breast cancer Paternal Aunt        2 aunts    SOCIAL HX: reviewed.    Current Outpatient Medications:  .  acetaminophen (TYLENOL) 500 MG tablet, Take 1,000 mg by mouth daily as needed for moderate pain. , Disp: , Rfl:  .  amLODipine (NORVASC) 10 MG tablet, Take 1 tablet (10 mg total) by mouth daily., Disp: 90 tablet, Rfl: 1 .  fexofenadine-pseudoephedrine (ALLEGRA-D) 60-120 MG 12 hr tablet, Take 1 tablet by mouth daily as needed (allergies). , Disp: , Rfl:  .  fluticasone (FLONASE) 50 MCG/ACT nasal spray, Place 2 sprays into the nose daily as needed for allergies. , Disp: , Rfl:  .  hydrochlorothiazide (HYDRODIURIL) 25 MG tablet, Take 1 tablet (25 mg total) by mouth daily., Disp: 90 tablet, Rfl: 1 .  ibuprofen (ADVIL,MOTRIN) 200 MG tablet, Take 600-800 mg by mouth daily as needed (arthritis). , Disp: , Rfl:  .  lisinopril (ZESTRIL) 40 MG tablet, Take 1 tablet (40 mg total) by mouth daily., Disp: 90 tablet, Rfl: 1 .  loratadine (CLARITIN) 10 MG tablet, Take 10 mg by mouth daily. , Disp: , Rfl:  .  metFORMIN (GLUCOPHAGE XR) 500 MG 24 hr tablet, Take 1 tablet (500 mg total) by mouth daily with breakfast., Disp: 30 tablet, Rfl: 2 .  naproxen sodium (ANAPROX) 220 MG tablet, Take 440 mg by mouth daily as needed (pain). , Disp: , Rfl:  .  PARoxetine (PAXIL) 10 MG tablet, Take 1 tablet (10 mg total) by mouth every morning., Disp: 90 tablet, Rfl: 1  EXAM:  VITALS per patient if applicable: 170/01  GENERAL: alert, oriented, appears well and in no acute distress  HEENT: atraumatic, conjunttiva clear, no obvious abnormalities on inspection of external nose and ears  NECK: normal  movements of the head and neck  LUNGS: on inspection no signs of respiratory distress, breathing rate appears normal, no obvious gross SOB, gasping or wheezing  CV: no obvious cyanosis  PSYCH/NEURO: pleasant and cooperative, no obvious depression or anxiety, speech and thought processing grossly intact  ASSESSMENT AND PLAN:  Discussed the following assessment and plan:  Abnormal liver function tests Previous ultrasound revealed fatty liver.  Previously worked up by GI.  Low carb diet, exercise and weight loss.  Placed on metformin.  Follow liver function tests.  Hypercholesterolemia Low carb diet and exercise.  Follow lipid panel.    Diabetes mellitus without complication ([facility]) Low carb diet and exercise.  Discussed labs.  On metformin.  Some loose stool.  Will change to extended release form.  See if this help with bowel issues.  Follow met b and a1c.   Hypertension Blood pressure under good control.  Continue same medication regimen.  Follow pressures.  Follow metabolic panel.    Immunization counseling Needs pneumovax.  Discussed with her today.  When she comes in for labs, will get pneumovax.     I discussed the assessment and treatment plan with the patient. The patient was provided an opportunity to ask questions and all were answered. The patient agreed with the plan and demonstrated an understanding of the instructions.   The patient was advised to call back or seek an in-person evaluation if the symptoms worsen or if the condition fails to improve as anticipated.   Einar Pheasant, MD

## 2019-03-07 NOTE — Telephone Encounter (Signed)
Lm for pt to call back and schedule the following appts Return in about 4 months for physical schedule fasting labs in the next 1-2 weeks.Needs Pneumovax with labs.

## 2019-03-07 NOTE — Assessment & Plan Note (Signed)
Previous ultrasound revealed fatty liver.  Previously worked up by GI.  Low carb diet, exercise and weight loss.  Placed on metformin.  Follow liver function tests.

## 2019-03-08 ENCOUNTER — Encounter: Payer: Self-pay | Admitting: Internal Medicine

## 2019-03-08 DIAGNOSIS — Z7189 Other specified counseling: Secondary | ICD-10-CM | POA: Insufficient documentation

## 2019-03-08 DIAGNOSIS — Z7185 Encounter for immunization safety counseling: Secondary | ICD-10-CM | POA: Insufficient documentation

## 2019-03-08 NOTE — Assessment & Plan Note (Signed)
Low carb diet and exercise.  Follow lipid panel.  

## 2019-03-08 NOTE — Telephone Encounter (Signed)
Pt called back and scheduled

## 2019-03-08 NOTE — Assessment & Plan Note (Signed)
Low carb diet and exercise.  Discussed labs.  On metformin.  Some loose stool.  Will change to extended release form.  See if this help with bowel issues.  Follow met b and a1c.

## 2019-03-08 NOTE — Assessment & Plan Note (Signed)
Blood pressure under good control.  Continue same medication regimen.  Follow pressures.  Follow metabolic panel.   

## 2019-03-08 NOTE — Assessment & Plan Note (Signed)
Needs pneumovax.  Discussed with her today.  When she comes in for labs, will get pneumovax.

## 2019-03-30 ENCOUNTER — Other Ambulatory Visit: Payer: Self-pay

## 2019-03-30 ENCOUNTER — Other Ambulatory Visit (INDEPENDENT_AMBULATORY_CARE_PROVIDER_SITE_OTHER): Payer: 59

## 2019-03-30 ENCOUNTER — Ambulatory Visit: Payer: 59

## 2019-03-30 DIAGNOSIS — E7800 Pure hypercholesterolemia, unspecified: Secondary | ICD-10-CM

## 2019-03-30 DIAGNOSIS — I10 Essential (primary) hypertension: Secondary | ICD-10-CM

## 2019-03-30 DIAGNOSIS — E119 Type 2 diabetes mellitus without complications: Secondary | ICD-10-CM

## 2019-03-30 DIAGNOSIS — R945 Abnormal results of liver function studies: Secondary | ICD-10-CM | POA: Diagnosis not present

## 2019-03-30 DIAGNOSIS — R7989 Other specified abnormal findings of blood chemistry: Secondary | ICD-10-CM

## 2019-03-30 LAB — LDL CHOLESTEROL, DIRECT: Direct LDL: 93 mg/dL

## 2019-03-30 LAB — CBC WITH DIFFERENTIAL/PLATELET
Basophils Absolute: 0 10*3/uL (ref 0.0–0.1)
Basophils Relative: 1 % (ref 0.0–3.0)
Eosinophils Absolute: 0 10*3/uL (ref 0.0–0.7)
Eosinophils Relative: 1.2 % (ref 0.0–5.0)
HCT: 38.1 % (ref 36.0–46.0)
Hemoglobin: 12.8 g/dL (ref 12.0–15.0)
Lymphocytes Relative: 32.6 % (ref 12.0–46.0)
Lymphs Abs: 1.2 10*3/uL (ref 0.7–4.0)
MCHC: 33.5 g/dL (ref 30.0–36.0)
MCV: 84 fl (ref 78.0–100.0)
Monocytes Absolute: 0.5 10*3/uL (ref 0.1–1.0)
Monocytes Relative: 12.5 % — ABNORMAL HIGH (ref 3.0–12.0)
Neutro Abs: 1.9 10*3/uL (ref 1.4–7.7)
Neutrophils Relative %: 52.7 % (ref 43.0–77.0)
Platelets: 249 10*3/uL (ref 150.0–400.0)
RBC: 4.54 Mil/uL (ref 3.87–5.11)
RDW: 13.9 % (ref 11.5–15.5)
WBC: 3.6 10*3/uL — ABNORMAL LOW (ref 4.0–10.5)

## 2019-03-30 LAB — MICROALBUMIN / CREATININE URINE RATIO
Creatinine,U: 79.3 mg/dL
Microalb Creat Ratio: 6.4 mg/g (ref 0.0–30.0)
Microalb, Ur: 5 mg/dL — ABNORMAL HIGH (ref 0.0–1.9)

## 2019-03-30 LAB — BASIC METABOLIC PANEL
BUN: 14 mg/dL (ref 6–23)
CO2: 27 mEq/L (ref 19–32)
Calcium: 9.7 mg/dL (ref 8.4–10.5)
Chloride: 104 mEq/L (ref 96–112)
Creatinine, Ser: 0.81 mg/dL (ref 0.40–1.20)
GFR: 71.49 mL/min (ref >60.00)
Glucose, Bld: 121 mg/dL — ABNORMAL HIGH (ref 70–99)
Potassium: 3.8 mEq/L (ref 3.5–5.1)
Sodium: 138 mEq/L (ref 135–145)

## 2019-03-30 LAB — LIPID PANEL
Cholesterol: 176 mg/dL (ref 0–200)
HDL: 36.5 mg/dL — ABNORMAL LOW (ref >39.00)
NonHDL: 139.57
Total CHOL/HDL Ratio: 5
Triglycerides: 260 mg/dL — ABNORMAL HIGH (ref 0.0–149.0)
VLDL: 52 mg/dL — ABNORMAL HIGH (ref 0.0–40.0)

## 2019-03-30 LAB — HEMOGLOBIN A1C: Hgb A1c MFr Bld: 6.4 % (ref 4.6–6.5)

## 2019-03-30 LAB — HEPATIC FUNCTION PANEL
ALT: 37 U/L — ABNORMAL HIGH (ref 0–35)
AST: 23 U/L (ref 0–37)
Albumin: 4.4 g/dL (ref 3.5–5.2)
Alkaline Phosphatase: 62 U/L (ref 39–117)
Bilirubin, Direct: 0.1 mg/dL (ref 0.0–0.3)
Total Bilirubin: 0.5 mg/dL (ref 0.2–1.2)
Total Protein: 6.9 g/dL (ref 6.0–8.3)

## 2019-03-30 LAB — TSH: TSH: 2.1 u[IU]/mL (ref 0.35–4.50)

## 2019-03-31 ENCOUNTER — Encounter: Payer: Self-pay | Admitting: Internal Medicine

## 2019-03-31 ENCOUNTER — Other Ambulatory Visit: Payer: Self-pay | Admitting: Internal Medicine

## 2019-03-31 DIAGNOSIS — D72819 Decreased white blood cell count, unspecified: Secondary | ICD-10-CM

## 2019-03-31 NOTE — Progress Notes (Signed)
Order placed for f/u [facility].   

## 2019-04-04 ENCOUNTER — Telehealth: Payer: Self-pay

## 2019-04-04 NOTE — Telephone Encounter (Signed)
Pt needs non fasting labs in 3-4 weeks. Please schedule.

## 2019-04-04 NOTE — Telephone Encounter (Signed)
Unable to lm to schedule appt  Will try again

## 2019-04-04 NOTE — Telephone Encounter (Signed)
Dr Nicki Reaper: This was routed to you in error. Sending to [facility] for scheduling.

## 2019-04-05 NOTE — Telephone Encounter (Signed)
Lm to schedule lab appt

## 2019-04-14 ENCOUNTER — Ambulatory Visit
Admission: RE | Admit: 2019-04-14 | Discharge: 2019-04-14 | Disposition: A | Discharge status: Home / Self Care | Payer: 59 | Source: Ambulatory Visit | Attending: Internal Medicine | Admitting: Internal Medicine

## 2019-04-14 ENCOUNTER — Encounter: Payer: Self-pay | Admitting: Internal Medicine

## 2019-04-14 DIAGNOSIS — Z1231 Encounter for screening mammogram for malignant neoplasm of breast: Secondary | ICD-10-CM | POA: Diagnosis not present

## 2019-04-25 ENCOUNTER — Other Ambulatory Visit: Payer: Self-pay

## 2019-04-27 ENCOUNTER — Ambulatory Visit: Payer: 59

## 2019-04-27 ENCOUNTER — Other Ambulatory Visit: Payer: Self-pay

## 2019-04-27 ENCOUNTER — Other Ambulatory Visit (INDEPENDENT_AMBULATORY_CARE_PROVIDER_SITE_OTHER): Payer: 59

## 2019-04-27 DIAGNOSIS — D72819 Decreased white blood cell count, unspecified: Secondary | ICD-10-CM

## 2019-04-28 LAB — CBC WITH DIFFERENTIAL/PLATELET
Basophils Absolute: 0.1 10*3/uL (ref 0.0–0.1)
Basophils Relative: 1.1 % (ref 0.0–3.0)
Eosinophils Absolute: 0.2 10*3/uL (ref 0.0–0.7)
Eosinophils Relative: 2.1 % (ref 0.0–5.0)
HCT: 39.7 % (ref 36.0–46.0)
Hemoglobin: 13.4 g/dL (ref 12.0–15.0)
Lymphocytes Relative: 32.1 % (ref 12.0–46.0)
Lymphs Abs: 2.9 10*3/uL (ref 0.7–4.0)
MCHC: 33.8 g/dL (ref 30.0–36.0)
MCV: 84.7 fl (ref 78.0–100.0)
Monocytes Absolute: 0.7 10*3/uL (ref 0.1–1.0)
Monocytes Relative: 8.1 % (ref 3.0–12.0)
Neutro Abs: 5.1 10*3/uL (ref 1.4–7.7)
Neutrophils Relative %: 56.6 % (ref 43.0–77.0)
Platelets: 324 10*3/uL (ref 150.0–400.0)
RBC: 4.69 Mil/uL (ref 3.87–5.11)
RDW: 14.3 % (ref 11.5–15.5)
WBC: 9 10*3/uL (ref 4.0–10.5)

## 2019-04-29 ENCOUNTER — Encounter: Payer: Self-pay | Admitting: Internal Medicine

## 2019-05-17 ENCOUNTER — Other Ambulatory Visit: Payer: Self-pay

## 2019-05-17 ENCOUNTER — Ambulatory Visit (INDEPENDENT_AMBULATORY_CARE_PROVIDER_SITE_OTHER): Payer: 59 | Admitting: Lab

## 2019-05-17 VITALS — Temp 96.7°F

## 2019-05-17 DIAGNOSIS — Z23 Encounter for immunization: Secondary | ICD-10-CM

## 2019-05-17 NOTE — Progress Notes (Signed)
Pt in office today for Pneumococcal 23 vaccine in L-Deltoid.  Pt tolerated well.

## 2019-06-14 ENCOUNTER — Other Ambulatory Visit: Payer: Self-pay | Admitting: Internal Medicine

## 2019-07-11 ENCOUNTER — Encounter: Payer: Self-pay | Admitting: Internal Medicine

## 2019-07-11 ENCOUNTER — Ambulatory Visit (INDEPENDENT_AMBULATORY_CARE_PROVIDER_SITE_OTHER): Payer: 59 | Admitting: Internal Medicine

## 2019-07-11 ENCOUNTER — Other Ambulatory Visit: Payer: Self-pay

## 2019-07-11 VITALS — BP 118/74 | HR 89 | Temp 98.1°F | Resp 16 | Ht 62.0 in | Wt 192.0 lb

## 2019-07-11 DIAGNOSIS — R945 Abnormal results of liver function studies: Secondary | ICD-10-CM | POA: Diagnosis not present

## 2019-07-11 DIAGNOSIS — Z0000 Encounter for general adult medical examination without abnormal findings: Secondary | ICD-10-CM

## 2019-07-11 DIAGNOSIS — I10 Essential (primary) hypertension: Secondary | ICD-10-CM | POA: Diagnosis not present

## 2019-07-11 DIAGNOSIS — E7800 Pure hypercholesterolemia, unspecified: Secondary | ICD-10-CM

## 2019-07-11 DIAGNOSIS — E119 Type 2 diabetes mellitus without complications: Secondary | ICD-10-CM | POA: Diagnosis not present

## 2019-07-11 DIAGNOSIS — R7989 Other specified abnormal findings of blood chemistry: Secondary | ICD-10-CM

## 2019-07-11 DIAGNOSIS — R252 Cramp and spasm: Secondary | ICD-10-CM

## 2019-07-11 LAB — LIPID PANEL
Cholesterol: 211 mg/dL — ABNORMAL HIGH (ref 0–200)
HDL: 34.1 mg/dL — ABNORMAL LOW (ref >39.00)
NonHDL: 177.21
Total CHOL/HDL Ratio: 6
Triglycerides: 210 mg/dL — ABNORMAL HIGH (ref 0.0–149.0)
VLDL: 42 mg/dL — ABNORMAL HIGH (ref 0.0–40.0)

## 2019-07-11 LAB — CBC WITH DIFFERENTIAL/PLATELET
Basophils Absolute: 0 10*3/uL (ref 0.0–0.1)
Basophils Relative: 0.7 % (ref 0.0–3.0)
Eosinophils Absolute: 0.2 10*3/uL (ref 0.0–0.7)
Eosinophils Relative: 2.7 % (ref 0.0–5.0)
HCT: 40.7 % (ref 36.0–46.0)
Hemoglobin: 14.2 g/dL (ref 12.0–15.0)
Lymphocytes Relative: 33.9 % (ref 12.0–46.0)
Lymphs Abs: 2.3 10*3/uL (ref 0.7–4.0)
MCHC: 34.9 g/dL (ref 30.0–36.0)
MCV: 85.2 fl (ref 78.0–100.0)
Monocytes Absolute: 0.5 10*3/uL (ref 0.1–1.0)
Monocytes Relative: 8 % (ref 3.0–12.0)
Neutro Abs: 3.7 10*3/uL (ref 1.4–7.7)
Neutrophils Relative %: 54.7 % (ref 43.0–77.0)
Platelets: 273 10*3/uL (ref 150.0–400.0)
RBC: 4.78 Mil/uL (ref 3.87–5.11)
RDW: 13.9 % (ref 11.5–15.5)
WBC: 6.8 10*3/uL (ref 4.0–10.5)

## 2019-07-11 LAB — HEPATIC FUNCTION PANEL
ALT: 33 U/L (ref 0–35)
AST: 21 U/L (ref 0–37)
Albumin: 4.6 g/dL (ref 3.5–5.2)
Alkaline Phosphatase: 55 U/L (ref 39–117)
Bilirubin, Direct: 0 mg/dL (ref 0.0–0.3)
Total Bilirubin: 0.4 mg/dL (ref 0.2–1.2)
Total Protein: 7.2 g/dL (ref 6.0–8.3)

## 2019-07-11 LAB — BASIC METABOLIC PANEL
BUN: 17 mg/dL (ref 6–23)
CO2: 25 mEq/L (ref 19–32)
Calcium: 9.7 mg/dL (ref 8.4–10.5)
Chloride: 103 mEq/L (ref 96–112)
Creatinine, Ser: 0.81 mg/dL (ref 0.40–1.20)
GFR: 71.43 mL/min (ref >60.00)
Glucose, Bld: 124 mg/dL — ABNORMAL HIGH (ref 70–99)
Potassium: 4 mEq/L (ref 3.5–5.1)
Sodium: 137 mEq/L (ref 135–145)

## 2019-07-11 LAB — LDL CHOLESTEROL, DIRECT: Direct LDL: 130 mg/dL

## 2019-07-11 LAB — HEMOGLOBIN A1C: Hgb A1c MFr Bld: 6.3 % (ref 4.6–6.5)

## 2019-07-11 LAB — MAGNESIUM: Magnesium: 1.7 mg/dL (ref 1.5–2.5)

## 2019-07-11 NOTE — Assessment & Plan Note (Signed)
Physical today 07/11/19.  PAP 06/14/18 negative with negative HPV.  Colonoscopy 01/2016 - recommended f/u in 5 years.

## 2019-07-11 NOTE — Progress Notes (Signed)
[name], female   DOB: 1956-02-15, 64 y.o.   MRN: [id number]   Subjective:    [name], female    DOB: 09-Feb-1956, 64 y.o.   MRN: [id number]  HPI This visit occurred during the SARS-CoV-2 public health emergency.  Safety protocols were in place, including screening questions prior to the visit, additional usage of staff PPE, and extensive cleaning of exam room while observing appropriate contact time as indicated for disinfecting solutions.  Patient here for her physical exam.  She reports she is doing relatively well.  Stays active.  No chest pain or sob with increased activity or exertion.  Does report noticing leg cramps - dancing.  Discussed stretches.  No acid reflux or abdominal pain reported.  Bowels stable.  Blood pressure checks:  110-120/70s.    Past Medical History:  Diagnosis Date  . Abnormal liver function tests   . Allergy   . Anemia    H/O  . Arthritis    RA  . Depression   . Diabetes mellitus without complication ([place])   . Fatty liver   . Hypercholesterolemia   . Hypertension   . Migraine headache    better since menopause  . Neuromuscular disorder ([place])   . Recurrent sinusitis    allergies  . Tuberculosis 1980'S   + TB TEST-WAS TREATED   Past Surgical History:  Procedure Laterality Date  . BILATERAL CARPAL TUNNEL RELEASE    . BREAST BIOPSY Left 2004   x 2  . BREAST BIOPSY Left 05/12/2017   FRAGMENTS WITH FEATURES OF AN EARLY INTRADUCTAL PAPILLOMA WITH aprocine changes  . BREAST BIOPSY Left 06/12/2017   Procedure: BREAST BIOPSY;  Surgeon: Robert Bellow, MD;  Location: [facility];  Service: General;  Laterality: Left;  . BREAST DUCTAL SYSTEM EXCISION Left 06/12/2017   Procedure: EXCISION DUCTAL SYSTEM BREAST;  Surgeon: Robert Bellow, MD;  Location: [facility];  Service: General;  Laterality: Left;  . COLONOSCOPY WITH PROPOFOL N/A 02/11/2016   Procedure: COLONOSCOPY WITH PROPOFOL;  Surgeon: Lucilla Lame, MD;  Location:  [place];  Service: Endoscopy;  Laterality: N/A;  . 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 FRACTURE SURGERY  2017  . OSTECTOMY Left 06/01/2015   Procedure: OSTECTOMY/ part. excision fracture fragment left 4th toe;  Surgeon: Sharlotte Alamo, DPM;  Location: [facility];  Service: Podiatry;  Laterality: Left;  . POLYPECTOMY N/A 02/11/2016   Procedure: POLYPECTOMY;  Surgeon: Lucilla Lame, MD;  Location: [place];  Service: Endoscopy;  Laterality: N/A;  . RHINOPLASTY    . TUBAL LIGATION  1997   Family History  Problem Relation Age of Onset  . Heart disease Father        died age 50 (MI)  . Heart disease Other        4 uncles - CABG  . Colon cancer Other        great aunt  . Heart disease Paternal Grandmother        first MI - 49s  . Migraines Mother   . Breast cancer Mother 75  . Hypertension Mother   . Hypertension Maternal Grandmother   . Early death Maternal Grandmother   . Diabetes Paternal Grandfather   . Stroke Paternal Grandfather   . Depression Maternal Aunt   . Breast cancer Other  1 mat and 1 pat  gr aunt   Social History   Socioeconomic History  . Marital status: Married    Spouse name: Not on file  . Number of children: 2  . Years of education: Not on file  . Highest education level: Not on file  Occupational History  . Not on file  Tobacco Use  . Smoking status: Never Smoker  . Smokeless tobacco: Never Used  Substance and Sexual Activity  . Alcohol use: Yes    Alcohol/week: 1.0 standard drinks    Types: 1 Cans of beer per week    Comment: occasional  . Drug use: No  . Sexual activity: Yes    Birth control/protection: Post-menopausal, Surgical  Other Topics Concern  . Not on file  Social History Narrative  . Not on file   Social Determinants of Health   Financial Resource Strain:   .  Difficulty of Paying Living Expenses:   Food Insecurity:   . Worried About Charity fundraiser in the Last Year:   . Arboriculturist in the Last Year:   Transportation Needs:   . Film/video editor (Medical):   Marland Kitchen Lack of Transportation (Non-Medical):   Physical Activity:   . Days of Exercise per Week:   . Minutes of Exercise per Session:   Stress:   . Feeling of Stress :   Social Connections:   . Frequency of Communication with Friends and Family:   . Frequency of Social Gatherings with Friends and Family:   . Attends Religious Services:   . Active Member of Clubs or Organizations:   . Attends Archivist Meetings:   Marland Kitchen Marital Status:     Outpatient Encounter Medications as of 07/11/2019  Medication Sig  . acetaminophen (TYLENOL) 500 MG tablet Take 1,000 mg by mouth daily as needed for moderate pain.   Marland Kitchen amLODipine (NORVASC) 10 MG tablet Take 1 tablet (10 mg total) by mouth daily.  . fexofenadine-pseudoephedrine (ALLEGRA-D) 60-120 MG 12 hr tablet Take 1 tablet by mouth daily as needed (allergies).   . fluticasone (FLONASE) 50 MCG/ACT nasal spray Place 2 sprays into the nose daily as needed for allergies.   . hydrochlorothiazide (HYDRODIURIL) 25 MG tablet Take 1 tablet (25 mg total) by mouth daily.  Marland Kitchen ibuprofen (ADVIL,MOTRIN) 200 MG tablet Take 600-800 mg by mouth daily as needed (arthritis).   Marland Kitchen lisinopril (ZESTRIL) 40 MG tablet Take 1 tablet (40 mg total) by mouth daily.  Marland Kitchen loratadine (CLARITIN) 10 MG tablet Take 10 mg by mouth daily.   . metFORMIN (GLUCOPHAGE-XR) 500 MG 24 hr tablet TAKE 1 TABLET BY MOUTH DAILY WITH BREAKFAST  . naproxen sodium (ANAPROX) 220 MG tablet Take 440 mg by mouth daily as needed (pain).   Marland Kitchen PARoxetine (PAXIL) 10 MG tablet Take 1 tablet (10 mg total) by mouth every morning.   No facility-administered encounter medications on file as of 07/11/2019.    Review of Systems  Constitutional: Negative for appetite change and unexpected weight  change.  HENT: Negative for congestion and sinus pressure.   Eyes: Negative for pain and visual disturbance.  Respiratory: Negative for cough, chest tightness and shortness of breath.   Cardiovascular: Negative for chest pain, palpitations and leg swelling.  Gastrointestinal: Negative for abdominal pain, diarrhea, nausea and vomiting.  Genitourinary: Negative for difficulty urinating and dysuria.  Musculoskeletal: Negative for joint swelling and myalgias.  Skin: Negative for color change and rash.  Neurological: Negative for dizziness, light-headedness and  headaches.  Hematological: Negative for adenopathy. Does not bruise/bleed easily.  Psychiatric/Behavioral: Negative for agitation and dysphoric mood.       Objective:    Physical Exam Vitals reviewed.  Constitutional:      General: She is not in acute distress.    Appearance: Normal appearance. She is well-developed.  HENT:     Head: Normocephalic and atraumatic.     Right Ear: External ear normal.     Left Ear: External ear normal.  Eyes:     General: No scleral icterus.       Right eye: No discharge.        Left eye: No discharge.     Conjunctiva/sclera: Conjunctivae normal.  Neck:     Thyroid: No thyromegaly.  Cardiovascular:     Rate and Rhythm: Normal rate and regular rhythm.  Pulmonary:     Effort: No tachypnea, accessory muscle usage or respiratory distress.     Breath sounds: Normal breath sounds. No decreased breath sounds or wheezing.  Chest:     Breasts:        Right: No inverted nipple, mass, nipple discharge or tenderness (no axillary adenopathy).        Left: No inverted nipple, mass, nipple discharge or tenderness (no axilarry adenopathy).  Abdominal:     General: Bowel sounds are normal.     Palpations: Abdomen is soft.     Tenderness: There is no abdominal tenderness.  Musculoskeletal:        General: No swelling or tenderness.     Cervical back: Neck supple. No tenderness.  Lymphadenopathy:      Cervical: No cervical adenopathy.  Skin:    Findings: No erythema or rash.  Neurological:     Mental Status: She is alert and oriented to person, place, and time.  Psychiatric:        Mood and Affect: Mood normal.        Behavior: Behavior normal.     BP 118/74   Pulse 89   Temp 98.1 F (36.7 C)   Resp 16   Ht _0  (1.575 m)   Wt 192 lb (87.1 kg)   LMP 05/30/2007   SpO2 98%   BMI 35.12 kg/m  Wt Readings from Last 3 Encounters:  07/11/19 192 lb (87.1 kg)  03/07/19 193 lb (87.5 kg)  02/10/19 192 lb 8 oz (87.3 kg)     Lab Results  Component Value Date   WBC 6.8 07/11/2019   HGB 14.2 07/11/2019   HCT 40.7 07/11/2019   PLT 273.0 07/11/2019   GLUCOSE 124 (H) 07/11/2019   CHOL 211 (H) 07/11/2019   TRIG 210.0 (H) 07/11/2019   HDL 34.10 (L) 07/11/2019   LDLDIRECT 130.0 07/11/2019   LDLCALC 146 (H) 06/07/2018   ALT 33 07/11/2019   AST 21 07/11/2019   NA 137 07/11/2019   K 4.0 07/11/2019   CL 103 07/11/2019   CREATININE 0.81 07/11/2019   BUN 17 07/11/2019   CO2 25 07/11/2019   TSH 2.10 03/30/2019   HGBA1C 6.3 07/11/2019   MICROALBUR 5.0 (H) 03/30/2019    MM 3D SCREEN BREAST BILATERAL  Result Date: 04/15/2019 CLINICAL DATA:  Screening. EXAM: DIGITAL SCREENING BILATERAL MAMMOGRAM WITH TOMO AND CAD COMPARISON:  Previous exam(s). ACR Breast Density Category c: The breast tissue is heterogeneously dense, which may obscure small masses. FINDINGS: There are no findings suspicious for malignancy. Images were processed with CAD. IMPRESSION: No mammographic evidence of malignancy. A result letter of this  screening mammogram will be mailed directly to the patient. RECOMMENDATION: Screening mammogram in one year. (Code:SM-B-01Y) BI-RADS CATEGORY  1: Negative. Electronically Signed   By: Claudie Revering M.D.   On: 04/15/2019 12:34       Assessment & Plan:   Problem List Items Addressed This Visit    Abnormal liver function tests    Previous ultrasound revealed fatty liver.   Previously worked up by GI.  Low carb diet and exercise.  On metformin.  Follow metabolic panel.       Diabetes mellitus without complication (HCC)    Low carb diet and exercise.  On metformin.  Follow met b and a1c.       Relevant Orders   Hemoglobin A1c (Completed)   Health care maintenance    Physical today 07/11/19.  PAP 06/14/18 negative with negative HPV.  Colonoscopy 01/2016 - recommended f/u in 5 years.        Hypercholesterolemia    Low carb diet and exercise.  Follow lipid panel.       Relevant Orders   Hepatic function panel (Completed)   Lipid panel (Completed)   Hypertension    Blood pressure under good control.  Continue amlodipine, hctz and lisinopril.  Follow metabolic panel.  Follow pressures.        Relevant Orders   CBC with Differential/Platelet (Completed)   Basic metabolic panel (Completed)   Leg cramp    Discussed stretching.  Stay hydrated.  Check electrolytes and magnesium.  Follow.        Relevant Orders   Magnesium (Completed)    Other Visit Diagnoses    Routine general medical examination at a health care facility    -  Primary       Einar Pheasant, MD

## 2019-07-14 ENCOUNTER — Telehealth: Payer: Self-pay | Admitting: *Deleted

## 2019-07-14 NOTE — Telephone Encounter (Signed)
-----   Message from Einar Pheasant, MD sent at 07/13/2019  6:39 AM EDT ----- Notify pt that her cholesterol (total and bad cholesterol ) increased when compared to previous check.  Triglycerides improved some.  Given her sugar history, it is recommended for her to start a cholesterol medication.  If agreeable, start crestor 5mg  q day.  Will need liver panel checked in 4-6 weeks.  Overall sugar control improved.  A1c 6.3.  magnesium in normal range, but low normal.  Start mag oxide 400mg  q day.  Can send in rx if agreeable to take.  Hgb, kidney function tests and liver function tests are wnl.

## 2019-07-14 NOTE — Telephone Encounter (Signed)
LMTCB

## 2019-07-16 ENCOUNTER — Encounter: Payer: Self-pay | Admitting: Internal Medicine

## 2019-07-16 NOTE — Assessment & Plan Note (Signed)
Low carb diet and exercise.  Follow lipid panel.  

## 2019-07-16 NOTE — Assessment & Plan Note (Signed)
Previous ultrasound revealed fatty liver.  Previously worked up by GI.  Low carb diet and exercise.  On metformin.  Follow metabolic panel.

## 2019-07-16 NOTE — Assessment & Plan Note (Signed)
Discussed stretching.  Stay hydrated.  Check electrolytes and magnesium.  Follow.

## 2019-07-16 NOTE — Assessment & Plan Note (Signed)
Low carb diet and exercise.  On metformin.  Follow met b and a1c.  

## 2019-07-16 NOTE — Assessment & Plan Note (Signed)
Blood pressure under good control.  Continue amlodipine, hctz and lisinopril.  Follow metabolic panel.  Follow pressures.

## 2019-07-22 ENCOUNTER — Encounter: Payer: Self-pay | Admitting: Internal Medicine

## 2019-07-25 NOTE — Telephone Encounter (Signed)
Pt returned call and asked would call her back on cell phone [phone].

## 2019-07-26 NOTE — Telephone Encounter (Signed)
Patient returned office phone call. 

## 2019-07-26 NOTE — Telephone Encounter (Signed)
LMTCB

## 2019-07-27 ENCOUNTER — Other Ambulatory Visit: Payer: Self-pay

## 2019-07-27 MED ORDER — MAGNESIUM OXIDE 400 MG PO TABS: 400.0000 mg | ORAL_TABLET | Freq: Every day | ORAL | 1 refills | Status: AC

## 2019-07-27 MED ORDER — ROSUVASTATIN CALCIUM 5 MG PO TABS: 5.0000 mg | ORAL_TABLET | Freq: Every day | ORAL | 1 refills | Status: DC

## 2019-07-27 NOTE — Telephone Encounter (Signed)
Gave patient her lab results. Started on crestor 5 mg q day and mag-ox 400 mg q day. Sent in rx. Scheduled for f/u liver panel in 6 weeks.

## 2019-09-02 ENCOUNTER — Telehealth: Payer: Self-pay | Admitting: *Deleted

## 2019-09-02 DIAGNOSIS — E7800 Pure hypercholesterolemia, unspecified: Secondary | ICD-10-CM

## 2019-09-02 NOTE — Telephone Encounter (Signed)
Order placed for f/u liver panel.  

## 2019-09-02 NOTE — Telephone Encounter (Signed)
Please place future orders for lab appt.  

## 2019-09-05 ENCOUNTER — Other Ambulatory Visit: Payer: Self-pay

## 2019-09-05 ENCOUNTER — Other Ambulatory Visit (INDEPENDENT_AMBULATORY_CARE_PROVIDER_SITE_OTHER): Payer: 59

## 2019-09-05 DIAGNOSIS — E7800 Pure hypercholesterolemia, unspecified: Secondary | ICD-10-CM

## 2019-09-05 LAB — HEPATIC FUNCTION PANEL
ALT: 33 U/L (ref 0–35)
AST: 27 U/L (ref 0–37)
Albumin: 4.6 g/dL (ref 3.5–5.2)
Alkaline Phosphatase: 52 U/L (ref 39–117)
Bilirubin, Direct: 0.1 mg/dL (ref 0.0–0.3)
Total Bilirubin: 0.6 mg/dL (ref 0.2–1.2)
Total Protein: 7 g/dL (ref 6.0–8.3)

## 2019-09-06 ENCOUNTER — Encounter: Payer: Self-pay | Admitting: Internal Medicine

## 2019-09-16 ENCOUNTER — Encounter: Payer: Self-pay | Admitting: Internal Medicine

## 2019-09-16 ENCOUNTER — Other Ambulatory Visit: Payer: Self-pay | Admitting: Internal Medicine

## 2019-10-03 ENCOUNTER — Other Ambulatory Visit: Payer: Self-pay | Admitting: Internal Medicine

## 2019-10-04 ENCOUNTER — Other Ambulatory Visit: Payer: Self-pay | Admitting: Internal Medicine

## 2019-10-17 ENCOUNTER — Other Ambulatory Visit: Payer: Self-pay | Admitting: Internal Medicine

## 2019-10-26 ENCOUNTER — Other Ambulatory Visit: Payer: Self-pay | Admitting: Internal Medicine

## 2019-11-10 ENCOUNTER — Encounter: Payer: Self-pay | Admitting: Internal Medicine

## 2019-11-12 NOTE — Telephone Encounter (Signed)
Please notify pt that I was out of the office last week.  Per note, the visit was changed to a virtual visit.  Pt wants to do labs 11/18/19.  Can these be scheduled?

## 2019-11-14 ENCOUNTER — Telehealth (INDEPENDENT_AMBULATORY_CARE_PROVIDER_SITE_OTHER): Payer: 59 | Admitting: Internal Medicine

## 2019-11-14 DIAGNOSIS — F439 Reaction to severe stress, unspecified: Secondary | ICD-10-CM | POA: Diagnosis not present

## 2019-11-14 DIAGNOSIS — E1165 Type 2 diabetes mellitus with hyperglycemia: Secondary | ICD-10-CM | POA: Diagnosis not present

## 2019-11-14 DIAGNOSIS — Z86010 Personal history of colonic polyps: Secondary | ICD-10-CM | POA: Diagnosis not present

## 2019-11-14 DIAGNOSIS — I10 Essential (primary) hypertension: Secondary | ICD-10-CM | POA: Diagnosis not present

## 2019-11-14 DIAGNOSIS — E7800 Pure hypercholesterolemia, unspecified: Secondary | ICD-10-CM

## 2019-11-14 DIAGNOSIS — R7989 Other specified abnormal findings of blood chemistry: Secondary | ICD-10-CM

## 2019-11-14 DIAGNOSIS — R945 Abnormal results of liver function studies: Secondary | ICD-10-CM

## 2019-11-14 NOTE — Progress Notes (Signed)
[name], female   DOB: [birth year], 64 y.o.   MRN: [id number]   Virtual Visit via video Note  This visit type was conducted due to national recommendations for restrictions regarding the COVID-19 pandemic (e.g. social distancing).  This format is felt to be most appropriate for this patient at this time.  All issues noted in this document were discussed and addressed.  No physical exam was performed (except for noted visual exam findings with Video Visits).   I connected with Dalbert Batman by a video enabled telemedicine application and verified that I am speaking with the correct person using two identifiers. Location patient: home Location provider: work Persons participating in the virtual visit: patient, provider  The limitations, risks, security and privacy concerns of performing an evaluation and management service by video and the availability of in person appointments have been discussed.   It has also been discussed with the patient that there may be a patient responsible charge related to this service. The patient expressed understanding and agreed to proceed.   Reason for visit: scheduled follow up.   HPI: She reports she is doing relatively well.  Some increased stress with work.  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  Trying to stay active.  No chest pain or sob.  Walking some.  Trying to watch her diet.  No abdominal pain or bowel change reported.  No upper GI symptoms reported.  Tolerating crestor.  Leg cramps are better.  Taking mag oxide.  Takes daily.  Occasionally will take an extra, but overall cramps are better.  Sleeping ok.     ROS: See pertinent positives and negatives per HPI.  Past Medical History:  Diagnosis Date  . Abnormal liver function tests   . Allergy   . Anemia    H/O  . Arthritis    RA  . Depression   . Diabetes mellitus without complication ([place])   . Fatty liver   . Hypercholesterolemia   .  Hypertension   . Migraine headache    better since menopause  . Neuromuscular disorder ([place])   . Recurrent sinusitis    allergies  . Tuberculosis 1980'S   + TB TEST-WAS TREATED    Past Surgical History:  Procedure Laterality Date  . BILATERAL CARPAL TUNNEL RELEASE    . BREAST BIOPSY Left 2004   x 2  . BREAST BIOPSY Left 05/12/2017   FRAGMENTS WITH FEATURES OF AN EARLY INTRADUCTAL PAPILLOMA WITH aprocine changes  . BREAST BIOPSY Left 06/12/2017   Procedure: BREAST BIOPSY;  Surgeon: Robert Bellow, MD;  Location: [facility];  Service: General;  Laterality: Left;  . BREAST DUCTAL SYSTEM EXCISION Left 06/12/2017   Procedure: EXCISION DUCTAL SYSTEM BREAST;  Surgeon: Robert Bellow, MD;  Location: [facility];  Service: General;  Laterality: Left;  . COLONOSCOPY WITH PROPOFOL N/A 02/11/2016   Procedure: COLONOSCOPY WITH PROPOFOL;  Surgeon: Lucilla Lame, MD;  Location: [place];  Service: Endoscopy;  Laterality: N/A;  . 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 FRACTURE SURGERY  2017  . OSTECTOMY Left 06/01/2015   Procedure: OSTECTOMY/ part. excision fracture fragment left 4th toe;  Surgeon: Sharlotte Alamo, DPM;  Location: [facility];  Service: Podiatry;  Laterality: Left;  . POLYPECTOMY N/A 02/11/2016   Procedure: POLYPECTOMY;  Surgeon: Evangeline Gula  Allen Norris, MD;  Location: [place];  Service: Endoscopy;  Laterality: N/A;  . RHINOPLASTY    . TUBAL LIGATION  1997    Family History  Problem Relation Age of Onset  . Heart disease Father        died age 44 (MI)  . Heart disease Other        4 uncles - CABG  . Colon cancer Other        great aunt  . Heart disease Paternal Grandmother        first MI - 71s  . Migraines Mother   . Breast cancer Mother 3  . Hypertension Mother   . Hypertension Maternal Grandmother   . Early death Maternal  Grandmother   . Diabetes Paternal Grandfather   . Stroke Paternal Grandfather   . Depression Maternal Aunt   . Breast cancer Other        1 mat and 1 pat  gr aunt    SOCIAL HX: reviewed   Current Outpatient Medications:  .  acetaminophen (TYLENOL) 500 MG tablet, Take 1,000 mg by mouth daily as needed for moderate pain. , Disp: , Rfl:  .  amLODipine (NORVASC) 10 MG tablet, TAKE 1 TABLET (10 MG TOTAL) BY MOUTH DAILY., Disp: 90 tablet, Rfl: 1 .  fexofenadine-pseudoephedrine (ALLEGRA-D) 60-120 MG 12 hr tablet, Take 1 tablet by mouth daily as needed (allergies). , Disp: , Rfl:  .  fluticasone (FLONASE) 50 MCG/ACT nasal spray, Place 2 sprays into the nose daily as needed for allergies. , Disp: , Rfl:  .  hydrochlorothiazide (HYDRODIURIL) 25 MG tablet, TAKE 1 TABLET BY MOUTH DAILY, Disp: 90 tablet, Rfl: 1 .  ibuprofen (ADVIL,MOTRIN) 200 MG tablet, Take 600-800 mg by mouth daily as needed (arthritis). , Disp: , Rfl:  .  lisinopril (ZESTRIL) 40 MG tablet, TAKE 1 TABLET (40 MG TOTAL) BY MOUTH DAILY., Disp: 90 tablet, Rfl: 1 .  loratadine (CLARITIN) 10 MG tablet, Take 10 mg by mouth daily. , Disp: , Rfl:  .  magnesium oxide (MAG-OX) 400 MG tablet, Take 1 tablet (400 mg total) by mouth daily., Disp: 90 tablet, Rfl: 1 .  metFORMIN (GLUCOPHAGE-XR) 500 MG 24 hr tablet, TAKE 1 TABLET BY MOUTH DAILY WITH BREAKFAST, Disp: 30 tablet, Rfl: 2 .  naproxen sodium (ANAPROX) 220 MG tablet, Take 440 mg by mouth daily as needed (pain). , Disp: , Rfl:  .  PARoxetine (PAXIL) 10 MG tablet, TAKE 1 TABLET BY MOUTH EVERY MORNING, Disp: 90 tablet, Rfl: 1 .  rosuvastatin (CRESTOR) 5 MG tablet, Take 1 tablet (5 mg total) by mouth daily., Disp: 90 tablet, Rfl: 1  EXAM:  GENERAL: alert, oriented, appears well and in no acute distress  HEENT: atraumatic, conjunttiva clear, no obvious abnormalities on inspection of external nose and ears  NECK: normal movements of the head and neck  LUNGS: on inspection no signs of  respiratory distress, breathing rate appears normal, no obvious gross SOB, gasping or wheezing  CV: no obvious cyanosis  PSYCH/NEURO: pleasant and cooperative, no obvious depression or anxiety, speech and thought processing grossly intact  ASSESSMENT AND PLAN:  Discussed the following assessment and plan:  Type 2 diabetes mellitus with hyperglycemia (HCC) Low carb diet and exercise.  On metformin.  Follow met b and a1c.    Hypertension Blood pressure under good control.  Continue hctz, amlodipine and lisinopril.  Follow pressures.  Follow metabolic panel.   Hypercholesterolemia Low carb diet and exercise.  Follow lipid panel.  History of colonic polyps Colonoscopy 01/2016.  Recommended f/u in 5 years.   Abnormal liver function tests Previous ultrasound revealed fatty liver.  Previously worked up by GI.  Low carb diet and exercise.  On metformin -- for better sugar control.  Follow liver function tests.    Stress Increased stress as outlined.  On paxil.  Overall she feel she is handling things relatively well.  Follow.     I discussed the assessment and treatment plan with the patient. The patient was provided an opportunity to ask questions and all were answered. The patient agreed with the plan and demonstrated an understanding of the instructions.   The patient was advised to call back or seek an in-person evaluation if the symptoms worsen or if the condition fails to improve as anticipated.   Einar Pheasant, MD

## 2019-11-16 ENCOUNTER — Telehealth: Payer: Self-pay | Admitting: *Deleted

## 2019-11-16 DIAGNOSIS — E1165 Type 2 diabetes mellitus with hyperglycemia: Secondary | ICD-10-CM

## 2019-11-16 DIAGNOSIS — E7800 Pure hypercholesterolemia, unspecified: Secondary | ICD-10-CM

## 2019-11-16 DIAGNOSIS — I10 Essential (primary) hypertension: Secondary | ICD-10-CM

## 2019-11-16 NOTE — Telephone Encounter (Signed)
Please place future orders for lab appt.  

## 2019-11-17 NOTE — Telephone Encounter (Signed)
Orders placed for f/u labs.  

## 2019-11-18 ENCOUNTER — Other Ambulatory Visit (INDEPENDENT_AMBULATORY_CARE_PROVIDER_SITE_OTHER): Payer: 59

## 2019-11-18 ENCOUNTER — Other Ambulatory Visit: Payer: Self-pay

## 2019-11-18 DIAGNOSIS — E1165 Type 2 diabetes mellitus with hyperglycemia: Secondary | ICD-10-CM

## 2019-11-18 DIAGNOSIS — I10 Essential (primary) hypertension: Secondary | ICD-10-CM

## 2019-11-18 DIAGNOSIS — E7800 Pure hypercholesterolemia, unspecified: Secondary | ICD-10-CM | POA: Diagnosis not present

## 2019-11-18 LAB — LIPID PANEL
Cholesterol: 146 mg/dL (ref 0–200)
HDL: 35.7 mg/dL — ABNORMAL LOW (ref >39.00)
LDL Cholesterol: 81 mg/dL (ref 0–99)
NonHDL: 110.25
Total CHOL/HDL Ratio: 4
Triglycerides: 148 mg/dL (ref 0.0–149.0)
VLDL: 29.6 mg/dL (ref 0.0–40.0)

## 2019-11-18 LAB — MAGNESIUM: Magnesium: 1.9 mg/dL (ref 1.5–2.5)

## 2019-11-18 LAB — HEPATIC FUNCTION PANEL
ALT: 42 U/L — ABNORMAL HIGH (ref 0–35)
AST: 25 U/L (ref 0–37)
Albumin: 4.6 g/dL (ref 3.5–5.2)
Alkaline Phosphatase: 52 U/L (ref 39–117)
Bilirubin, Direct: 0.1 mg/dL (ref 0.0–0.3)
Total Bilirubin: 0.7 mg/dL (ref 0.2–1.2)
Total Protein: 7.2 g/dL (ref 6.0–8.3)

## 2019-11-18 LAB — BASIC METABOLIC PANEL
BUN: 19 mg/dL (ref 6–23)
CO2: 27 mEq/L (ref 19–32)
Calcium: 10 mg/dL (ref 8.4–10.5)
Chloride: 102 mEq/L (ref 96–112)
Creatinine, Ser: 0.96 mg/dL (ref 0.40–1.20)
GFR: 58.64 mL/min — ABNORMAL LOW (ref >60.00)
Glucose, Bld: 129 mg/dL — ABNORMAL HIGH (ref 70–99)
Potassium: 4.2 mEq/L (ref 3.5–5.1)
Sodium: 137 mEq/L (ref 135–145)

## 2019-11-18 LAB — HEMOGLOBIN A1C: Hgb A1c MFr Bld: 6.2 % (ref 4.6–6.5)

## 2019-11-19 ENCOUNTER — Other Ambulatory Visit: Payer: Self-pay | Admitting: Internal Medicine

## 2019-11-19 DIAGNOSIS — R944 Abnormal results of kidney function studies: Secondary | ICD-10-CM

## 2019-11-19 DIAGNOSIS — R7989 Other specified abnormal findings of blood chemistry: Secondary | ICD-10-CM

## 2019-11-19 NOTE — Progress Notes (Signed)
Order placed for f/u labs.  

## 2019-11-21 ENCOUNTER — Encounter: Payer: Self-pay | Admitting: Internal Medicine

## 2019-11-21 DIAGNOSIS — F439 Reaction to severe stress, unspecified: Secondary | ICD-10-CM | POA: Insufficient documentation

## 2019-11-21 NOTE — Assessment & Plan Note (Signed)
Blood pressure under good control.  Continue hctz, amlodipine and lisinopril.  Follow pressures.  Follow metabolic panel.

## 2019-11-21 NOTE — Assessment & Plan Note (Signed)
Increased stress as outlined.  On paxil.  Overall she feel she is handling things relatively well.  Follow.

## 2019-11-21 NOTE — Assessment & Plan Note (Signed)
Low carb diet and exercise.  On metformin.  Follow met b and a1c.

## 2019-11-21 NOTE — Assessment & Plan Note (Signed)
Colonoscopy 01/2016.  Recommended f/u in 5 years.

## 2019-11-21 NOTE — Assessment & Plan Note (Signed)
Previous ultrasound revealed fatty liver.  Previously worked up by GI.  Low carb diet and exercise.  On metformin -- for better sugar control.  Follow liver function tests.

## 2019-11-21 NOTE — Assessment & Plan Note (Signed)
Low carb diet and exercise.  Follow lipid panel.  

## 2019-12-12 ENCOUNTER — Other Ambulatory Visit: Payer: Self-pay | Admitting: Internal Medicine

## 2019-12-20 ENCOUNTER — Encounter: Payer: Self-pay | Admitting: Internal Medicine

## 2019-12-21 ENCOUNTER — Other Ambulatory Visit: Payer: Self-pay

## 2019-12-21 ENCOUNTER — Other Ambulatory Visit (INDEPENDENT_AMBULATORY_CARE_PROVIDER_SITE_OTHER): Payer: 59

## 2019-12-21 DIAGNOSIS — R7989 Other specified abnormal findings of blood chemistry: Secondary | ICD-10-CM

## 2019-12-21 DIAGNOSIS — R945 Abnormal results of liver function studies: Secondary | ICD-10-CM

## 2019-12-21 DIAGNOSIS — R944 Abnormal results of kidney function studies: Secondary | ICD-10-CM | POA: Diagnosis not present

## 2019-12-21 LAB — BASIC METABOLIC PANEL
BUN: 18 mg/dL (ref 6–23)
CO2: 26 mEq/L (ref 19–32)
Calcium: 9.7 mg/dL (ref 8.4–10.5)
Chloride: 102 mEq/L (ref 96–112)
Creatinine, Ser: 0.86 mg/dL (ref 0.40–1.20)
GFR: 66.56 mL/min (ref >60.00)
Glucose, Bld: 121 mg/dL — ABNORMAL HIGH (ref 70–99)
Potassium: 4.1 mEq/L (ref 3.5–5.1)
Sodium: 137 mEq/L (ref 135–145)

## 2019-12-21 LAB — HEPATIC FUNCTION PANEL
ALT: 41 U/L — ABNORMAL HIGH (ref 0–35)
AST: 25 U/L (ref 0–37)
Albumin: 4.6 g/dL (ref 3.5–5.2)
Alkaline Phosphatase: 52 U/L (ref 39–117)
Bilirubin, Direct: 0.1 mg/dL (ref 0.0–0.3)
Total Bilirubin: 0.7 mg/dL (ref 0.2–1.2)
Total Protein: 7.2 g/dL (ref 6.0–8.3)

## 2019-12-27 DIAGNOSIS — H524 Presbyopia: Secondary | ICD-10-CM | POA: Diagnosis not present

## 2019-12-27 LAB — HM DIABETES EYE EXAM

## 2020-01-24 ENCOUNTER — Other Ambulatory Visit: Payer: Self-pay | Admitting: Internal Medicine

## 2020-03-21 ENCOUNTER — Other Ambulatory Visit: Payer: Self-pay | Admitting: Internal Medicine

## 2020-03-21 ENCOUNTER — Encounter: Payer: Self-pay | Admitting: Internal Medicine

## 2020-03-26 ENCOUNTER — Ambulatory Visit: Payer: 59 | Admitting: Internal Medicine

## 2020-03-30 ENCOUNTER — Other Ambulatory Visit: Payer: Self-pay | Admitting: Internal Medicine

## 2020-04-02 ENCOUNTER — Other Ambulatory Visit: Payer: Self-pay | Admitting: Internal Medicine

## 2020-04-09 ENCOUNTER — Other Ambulatory Visit: Payer: Self-pay

## 2020-04-09 ENCOUNTER — Encounter: Payer: Self-pay | Admitting: Internal Medicine

## 2020-04-09 ENCOUNTER — Ambulatory Visit (INDEPENDENT_AMBULATORY_CARE_PROVIDER_SITE_OTHER): Payer: 59 | Admitting: Internal Medicine

## 2020-04-09 VITALS — BP 114/70 | HR 79 | Temp 97.7°F | Resp 16 | Ht 62.0 in | Wt 197.0 lb

## 2020-04-09 DIAGNOSIS — R945 Abnormal results of liver function studies: Secondary | ICD-10-CM

## 2020-04-09 DIAGNOSIS — I10 Essential (primary) hypertension: Secondary | ICD-10-CM

## 2020-04-09 DIAGNOSIS — R7989 Other specified abnormal findings of blood chemistry: Secondary | ICD-10-CM

## 2020-04-09 DIAGNOSIS — Z1231 Encounter for screening mammogram for malignant neoplasm of breast: Secondary | ICD-10-CM | POA: Diagnosis not present

## 2020-04-09 DIAGNOSIS — E1165 Type 2 diabetes mellitus with hyperglycemia: Secondary | ICD-10-CM | POA: Diagnosis not present

## 2020-04-09 DIAGNOSIS — F439 Reaction to severe stress, unspecified: Secondary | ICD-10-CM | POA: Diagnosis not present

## 2020-04-09 DIAGNOSIS — E7800 Pure hypercholesterolemia, unspecified: Secondary | ICD-10-CM

## 2020-04-09 LAB — LIPID PANEL
Cholesterol: 163 mg/dL (ref 0–200)
HDL: 40.4 mg/dL (ref >39.00)
LDL Cholesterol: 97 mg/dL (ref 0–99)
NonHDL: 122.3
Total CHOL/HDL Ratio: 4
Triglycerides: 125 mg/dL (ref 0.0–149.0)
VLDL: 25 mg/dL (ref 0.0–40.0)

## 2020-04-09 LAB — HEPATIC FUNCTION PANEL
ALT: 64 U/L — ABNORMAL HIGH (ref 0–35)
AST: 39 U/L — ABNORMAL HIGH (ref 0–37)
Albumin: 4.9 g/dL (ref 3.5–5.2)
Alkaline Phosphatase: 56 U/L (ref 39–117)
Bilirubin, Direct: 0.1 mg/dL (ref 0.0–0.3)
Total Bilirubin: 0.6 mg/dL (ref 0.2–1.2)
Total Protein: 7.3 g/dL (ref 6.0–8.3)

## 2020-04-09 LAB — BASIC METABOLIC PANEL
BUN: 17 mg/dL (ref 6–23)
CO2: 28 mEq/L (ref 19–32)
Calcium: 10.2 mg/dL (ref 8.4–10.5)
Chloride: 101 mEq/L (ref 96–112)
Creatinine, Ser: 0.76 mg/dL (ref 0.40–1.20)
GFR: 83.23 mL/min (ref >60.00)
Glucose, Bld: 99 mg/dL (ref 70–99)
Potassium: 3.8 mEq/L (ref 3.5–5.1)
Sodium: 137 mEq/L (ref 135–145)

## 2020-04-09 LAB — TSH: TSH: 2.84 u[IU]/mL (ref 0.35–4.50)

## 2020-04-09 LAB — HM DIABETES FOOT EXAM

## 2020-04-09 LAB — HEMOGLOBIN A1C: Hgb A1c MFr Bld: 6.7 % — ABNORMAL HIGH (ref 4.6–6.5)

## 2020-04-09 NOTE — Progress Notes (Signed)
[name], female   DOB: 10-11-1955, 65 y.o.   MRN: [id number]   Subjective:    [name], female    DOB: 09/03/1955, 65 y.o.   MRN: [id number]  HPI This visit occurred during the SARS-CoV-2 public health emergency.  Safety protocols were in place, including screening questions prior to the visit, additional usage of staff PPE, and extensive cleaning of exam room while observing appropriate contact time as indicated for disinfecting solutions.  Patient here for a scheduled follow up.  Here to follow up regarding her blood pressure and cholesterol.  Increased stress at work.  Working overtime and covering other pts.  Discussed.  Overall she feels she is handling things relatively well.  Does not feel needs any further intervention.  Stays active.  No chest pain or sob reported.  No abdominal pain or bowel change reported.  Blood pressures doing well.  Discussed scheduling mammogram.  She will schedule.    Past Medical History:  Diagnosis Date  . Abnormal liver function tests   . Allergy   . Anemia    H/O  . Arthritis    RA  . Depression   . Diabetes mellitus without complication ([place])   . Fatty liver   . Hypercholesterolemia   . Hypertension   . Migraine headache    better since menopause  . Neuromuscular disorder ([place])   . Recurrent sinusitis    allergies  . Tuberculosis 1980'S   + TB TEST-WAS TREATED   Past Surgical History:  Procedure Laterality Date  . BILATERAL CARPAL TUNNEL RELEASE    . BREAST BIOPSY Left 2004   x 2  . BREAST BIOPSY Left 05/12/2017   FRAGMENTS WITH FEATURES OF AN EARLY INTRADUCTAL PAPILLOMA WITH aprocine changes  . BREAST BIOPSY Left 06/12/2017   Procedure: BREAST BIOPSY;  Surgeon: Robert Bellow, MD;  Location: [facility];  Service: General;  Laterality: Left;  . BREAST DUCTAL SYSTEM EXCISION Left 06/12/2017   Procedure: EXCISION DUCTAL SYSTEM BREAST;  Surgeon: Robert Bellow, MD;  Location: [facility];  Service:  General;  Laterality: Left;  . COLONOSCOPY WITH PROPOFOL N/A 02/11/2016   Procedure: COLONOSCOPY WITH PROPOFOL;  Surgeon: Lucilla Lame, MD;  Location: [place];  Service: Endoscopy;  Laterality: N/A;  . 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 FRACTURE SURGERY  2017  . OSTECTOMY Left 06/01/2015   Procedure: OSTECTOMY/ part. excision fracture fragment left 4th toe;  Surgeon: Sharlotte Alamo, DPM;  Location: [facility];  Service: Podiatry;  Laterality: Left;  . POLYPECTOMY N/A 02/11/2016   Procedure: POLYPECTOMY;  Surgeon: Lucilla Lame, MD;  Location: [place];  Service: Endoscopy;  Laterality: N/A;  . RHINOPLASTY    . TUBAL LIGATION  1997   Family History  Problem Relation Age of Onset  . Heart disease Father        died age 23 (MI)  . Heart disease Other        4 uncles - CABG  . Colon cancer Other        great aunt  . Heart disease Paternal Grandmother        first MI - 11s  . Migraines Mother   . Breast cancer Mother 15  . Hypertension Mother   . Hypertension Maternal Grandmother   . Early death Maternal Grandmother   .  Diabetes Paternal Grandfather   . Stroke Paternal Grandfather   . Depression Maternal Aunt   . Breast cancer Other        1 mat and 1 pat  gr aunt   Social History   Socioeconomic History  . Marital status: Married    Spouse name: Not on file  . Number of children: 2  . Years of education: Not on file  . Highest education level: Not on file  Occupational History  . Not on file  Tobacco Use  . Smoking status: Never Smoker  . Smokeless tobacco: Never Used  Vaping Use  . Vaping Use: Never used  Substance and Sexual Activity  . Alcohol use: Yes    Alcohol/week: 1.0 standard drink    Types: 1 Cans of beer per week    Comment: occasional  . Drug use: No  . Sexual activity: Yes    Birth  control/protection: Post-menopausal, Surgical  Other Topics Concern  . Not on file  Social History Narrative  . Not on file   Social Determinants of Health   Financial Resource Strain: Not on file  Food Insecurity: Not on file  Transportation Needs: Not on file  Physical Activity: Not on file  Stress: Not on file  Social Connections: Not on file    Outpatient Encounter Medications as of 04/09/2020  Medication Sig  . acetaminophen (TYLENOL) 500 MG tablet Take 1,000 mg by mouth daily as needed for moderate pain.   Marland Kitchen amLODipine (NORVASC) 10 MG tablet TAKE 1 TABLET (10 MG TOTAL) BY MOUTH DAILY.  . fexofenadine-pseudoephedrine (ALLEGRA-D) 60-120 MG 12 hr tablet Take 1 tablet by mouth daily as needed (allergies).   . fluticasone (FLONASE) 50 MCG/ACT nasal spray Place 2 sprays into the nose daily as needed for allergies.   . hydrochlorothiazide (HYDRODIURIL) 25 MG tablet TAKE 1 TABLET BY MOUTH DAILY  . ibuprofen (ADVIL,MOTRIN) 200 MG tablet Take 600-800 mg by mouth daily as needed (arthritis).   Marland Kitchen lisinopril (ZESTRIL) 40 MG tablet TAKE 1 TABLET (40 MG TOTAL) BY MOUTH DAILY.  Marland Kitchen loratadine (CLARITIN) 10 MG tablet Take 10 mg by mouth daily.   . magnesium oxide (MAG-OX) 400 MG tablet Take 1 tablet (400 mg total) by mouth daily.  . metFORMIN (GLUCOPHAGE-XR) 500 MG 24 hr tablet TAKE 1 TABLET BY MOUTH DAILY WITH BREAKFAST  . naproxen sodium (ANAPROX) 220 MG tablet Take 440 mg by mouth daily as needed (pain).   Marland Kitchen PARoxetine (PAXIL) 10 MG tablet TAKE 1 TABLET BY MOUTH EVERY MORNING  . rosuvastatin (CRESTOR) 5 MG tablet TAKE 1 TABLET (5 MG TOTAL) BY MOUTH DAILY.   No facility-administered encounter medications on file as of 04/09/2020.    Review of Systems  Constitutional: Negative for appetite change and unexpected weight change.  HENT: Negative for congestion and sinus pressure.   Respiratory: Negative for cough, chest tightness and shortness of breath.   Cardiovascular: Negative for chest  pain, palpitations and leg swelling.  Gastrointestinal: Negative for abdominal pain, diarrhea, nausea and vomiting.  Genitourinary: Negative for difficulty urinating and dysuria.  Musculoskeletal: Negative for joint swelling.  Skin: Negative for color change and rash.  Neurological: Negative for dizziness and headaches.  Psychiatric/Behavioral: Negative for agitation and dysphoric mood.       Objective:    Physical Exam Vitals reviewed.  Constitutional:      General: She is not in acute distress.    Appearance: Normal appearance.  HENT:     Head: Normocephalic and atraumatic.  Right Ear: External ear normal.     Left Ear: External ear normal.     Mouth/Throat:     Mouth: Oropharynx is clear and moist.  Eyes:     General: No scleral icterus.       Right eye: No discharge.        Left eye: No discharge.     Conjunctiva/sclera: Conjunctivae normal.  Neck:     Thyroid: No thyromegaly.  Cardiovascular:     Rate and Rhythm: Normal rate and regular rhythm.  Pulmonary:     Effort: No respiratory distress.     Breath sounds: Normal breath sounds. No wheezing.  Abdominal:     General: Bowel sounds are normal.     Palpations: Abdomen is soft.     Tenderness: There is no abdominal tenderness.  Musculoskeletal:        General: No swelling, tenderness or edema.     Cervical back: Neck supple. No tenderness.  Lymphadenopathy:     Cervical: No cervical adenopathy.  Skin:    Findings: No erythema or rash.  Neurological:     Mental Status: She is alert.  Psychiatric:        Mood and Affect: Mood normal.        Behavior: Behavior normal.     BP 114/70   Pulse 79   Temp 97.7 F (36.5 C) (Oral)   Resp 16   Ht _0  (1.575 m)   Wt 197 lb (89.4 kg)   LMP 05/30/2007   SpO2 98%   BMI 36.03 kg/m  Wt Readings from Last 3 Encounters:  04/09/20 197 lb (89.4 kg)  07/11/19 192 lb (87.1 kg)  03/07/19 193 lb (87.5 kg)     Lab Results  Component Value Date   WBC 6.8  07/11/2019   HGB 14.2 07/11/2019   HCT 40.7 07/11/2019   PLT 273.0 07/11/2019   GLUCOSE 99 04/09/2020   CHOL 163 04/09/2020   TRIG 125.0 04/09/2020   HDL 40.40 04/09/2020   LDLDIRECT 130.0 07/11/2019   LDLCALC 97 04/09/2020   ALT 64 (H) 04/09/2020   AST 39 (H) 04/09/2020   NA 137 04/09/2020   K 3.8 04/09/2020   CL 101 04/09/2020   CREATININE 0.76 04/09/2020   BUN 17 04/09/2020   CO2 28 04/09/2020   TSH 2.84 04/09/2020   HGBA1C 6.7 (H) 04/09/2020   MICROALBUR 4.5 (H) 04/09/2020    MM 3D SCREEN BREAST BILATERAL  Result Date: 04/15/2019 CLINICAL DATA:  Screening. EXAM: DIGITAL SCREENING BILATERAL MAMMOGRAM WITH TOMO AND CAD COMPARISON:  Previous exam(s). ACR Breast Density Category c: The breast tissue is heterogeneously dense, which may obscure small masses. FINDINGS: There are no findings suspicious for malignancy. Images were processed with CAD. IMPRESSION: No mammographic evidence of malignancy. A result letter of this screening mammogram will be mailed directly to the patient. RECOMMENDATION: Screening mammogram in one year. (Code:SM-B-01Y) BI-RADS CATEGORY  1: Negative. Electronically Signed   By: Claudie Revering M.D.   On: 04/15/2019 12:34       Assessment & Plan:   Problem List Items Addressed This Visit    Abnormal liver function tests    Previous ultrasound revealed fatty liver.  Low carb diet, exercise and weight loss.  Follow liver function tests.  Recent check - slightly elevated.        Hypercholesterolemia    Low carb diet and exercise.  Now on crestor.  Follow lipid panel and liver function tests.   Lab  Results  Component Value Date   CHOL 163 04/09/2020   HDL 40.40 04/09/2020   LDLCALC 97 04/09/2020   LDLDIRECT 130.0 07/11/2019   TRIG 125.0 04/09/2020   CHOLHDL 4 04/09/2020        Relevant Orders   Lipid panel (Completed)   Hepatic function panel (Completed)   Hypertension    Blood pressure doing well.  Continue hctz, amlodipine and lisinopril.  Follow  pressures.  GFR just checked 83.  Follow.       Relevant Orders   TSH (Completed)   Basic metabolic panel (Completed)   Stress    Doing well on paxil.  Continue.        Type 2 diabetes mellitus with hyperglycemia (HCC)    On metformin.  Low carb diet and exercise.  Follow met b and a1c.  Recent a1c 6.7.  Follow.       Relevant Orders   Hemoglobin A1c (Completed)   Microalbumin / creatinine urine ratio (Completed)    Other Visit Diagnoses    Encounter for screening mammogram for malignant neoplasm of breast    -  Primary   Relevant Orders   MM 3D SCREEN BREAST BILATERAL       Einar Pheasant, MD

## 2020-04-10 ENCOUNTER — Encounter: Payer: Self-pay | Admitting: Internal Medicine

## 2020-04-10 LAB — MICROALBUMIN / CREATININE URINE RATIO
Creatinine,U: 53 mg/dL
Microalb Creat Ratio: 8.5 mg/g (ref 0.0–30.0)
Microalb, Ur: 4.5 mg/dL — ABNORMAL HIGH (ref 0.0–1.9)

## 2020-04-11 ENCOUNTER — Other Ambulatory Visit: Payer: Self-pay | Admitting: Internal Medicine

## 2020-04-11 DIAGNOSIS — R7989 Other specified abnormal findings of blood chemistry: Secondary | ICD-10-CM

## 2020-04-11 DIAGNOSIS — R945 Abnormal results of liver function studies: Secondary | ICD-10-CM

## 2020-04-11 NOTE — Progress Notes (Signed)
Order placed for f/u liver panel.  

## 2020-04-12 ENCOUNTER — Telehealth: Payer: Self-pay

## 2020-04-12 DIAGNOSIS — R7989 Other specified abnormal findings of blood chemistry: Secondary | ICD-10-CM

## 2020-04-12 DIAGNOSIS — R945 Abnormal results of liver function studies: Secondary | ICD-10-CM

## 2020-04-12 NOTE — Addendum Note (Signed)
Addended by: Fulton Mole D on: 04/12/2020 10:35 AM   Modules accepted: Orders

## 2020-04-12 NOTE — Telephone Encounter (Signed)
I called and informed the patient of her lab results and I ordered and scheduled the patient for a hepatic function test in 3-4 weeks.  Cavion Faiola,cma

## 2020-04-12 NOTE — Telephone Encounter (Signed)
-----   Message from Einar Pheasant, MD sent at 04/11/2020  5:29 AM EST ----- Notify - triglycerides have improved.  overall sugar control has increased. A1c 6.7.  continue a low carb diet and exercise.  We will follow.  Liver functio tests have increased when compared to the previous check.  Continue diet, exercise and weight loss.  Also, limit alcohol intake.  Will need to recheck liver panel.  Schedule non fasting lab to recheck liver panel in 3-4 weeks.  Thyroid test and kidney function tests are wnl.

## 2020-04-15 ENCOUNTER — Encounter: Payer: Self-pay | Admitting: Internal Medicine

## 2020-04-15 NOTE — Assessment & Plan Note (Signed)
Doing well on paxil.  Continue.

## 2020-04-15 NOTE — Assessment & Plan Note (Signed)
On metformin.  Low carb diet and exercise.  Follow met b and a1c.  Recent a1c 6.7.  Follow.

## 2020-04-15 NOTE — Assessment & Plan Note (Signed)
Previous ultrasound revealed fatty liver.  Low carb diet, exercise and weight loss.  Follow liver function tests.  Recent check - slightly elevated.

## 2020-04-15 NOTE — Assessment & Plan Note (Signed)
Blood pressure doing well.  Continue hctz, amlodipine and lisinopril.  Follow pressures.  GFR just checked 83.  Follow.

## 2020-04-15 NOTE — Assessment & Plan Note (Signed)
Low carb diet and exercise.  Now on crestor.  Follow lipid panel and liver function tests.   Lab Results  Component Value Date   CHOL 163 04/09/2020   HDL 40.40 04/09/2020   LDLCALC 97 04/09/2020   LDLDIRECT 130.0 07/11/2019   TRIG 125.0 04/09/2020   CHOLHDL 4 04/09/2020

## 2020-04-18 ENCOUNTER — Other Ambulatory Visit: Payer: Self-pay

## 2020-04-18 ENCOUNTER — Ambulatory Visit
Admission: RE | Admit: 2020-04-18 | Discharge: 2020-04-18 | Disposition: A | Discharge status: Home / Self Care | Payer: 59 | Source: Ambulatory Visit | Attending: Internal Medicine | Admitting: Internal Medicine

## 2020-04-18 DIAGNOSIS — Z1231 Encounter for screening mammogram for malignant neoplasm of breast: Secondary | ICD-10-CM | POA: Diagnosis not present

## 2020-04-18 DIAGNOSIS — H43391 Other vitreous opacities, right eye: Secondary | ICD-10-CM | POA: Diagnosis not present

## 2020-04-20 ENCOUNTER — Other Ambulatory Visit: Payer: Self-pay | Admitting: Internal Medicine

## 2020-05-14 ENCOUNTER — Other Ambulatory Visit (INDEPENDENT_AMBULATORY_CARE_PROVIDER_SITE_OTHER): Payer: 59

## 2020-05-14 ENCOUNTER — Other Ambulatory Visit: Payer: Self-pay

## 2020-05-14 DIAGNOSIS — R7989 Other specified abnormal findings of blood chemistry: Secondary | ICD-10-CM

## 2020-05-14 DIAGNOSIS — R945 Abnormal results of liver function studies: Secondary | ICD-10-CM | POA: Diagnosis not present

## 2020-05-14 LAB — HEPATIC FUNCTION PANEL
ALT: 52 U/L — ABNORMAL HIGH (ref 0–35)
AST: 26 U/L (ref 0–37)
Albumin: 4.5 g/dL (ref 3.5–5.2)
Alkaline Phosphatase: 59 U/L (ref 39–117)
Bilirubin, Direct: 0.1 mg/dL (ref 0.0–0.3)
Total Bilirubin: 0.5 mg/dL (ref 0.2–1.2)
Total Protein: 7.2 g/dL (ref 6.0–8.3)

## 2020-06-19 DIAGNOSIS — H43391 Other vitreous opacities, right eye: Secondary | ICD-10-CM | POA: Diagnosis not present

## 2020-06-20 ENCOUNTER — Other Ambulatory Visit: Payer: Self-pay | Admitting: Internal Medicine

## 2020-07-21 ENCOUNTER — Other Ambulatory Visit: Payer: Self-pay | Admitting: Internal Medicine

## 2020-07-21 MED FILL — Lisinopril Tab 40 MG: ORAL | 90 days supply | Qty: 90 | Fill #0 | Status: AC

## 2020-07-21 MED FILL — Amlodipine Besylate Tab 10 MG (Base Equivalent): ORAL | 90 days supply | Qty: 90 | Fill #0 | Status: AC

## 2020-07-21 MED FILL — Metformin HCl Tab ER 24HR 500 MG: ORAL | 30 days supply | Qty: 30 | Fill #0 | Status: AC

## 2020-07-22 ENCOUNTER — Other Ambulatory Visit: Payer: Self-pay

## 2020-07-23 ENCOUNTER — Other Ambulatory Visit: Payer: Self-pay

## 2020-07-23 MED ORDER — ROSUVASTATIN CALCIUM 5 MG PO TABS
ORAL_TABLET | Freq: Every day | ORAL | 1 refills | Status: DC
  Filled 2020-07-23: qty 90, 90d supply, fill #0
  Filled 2020-10-17: qty 90, 90d supply, fill #1

## 2020-08-13 ENCOUNTER — Other Ambulatory Visit: Payer: Self-pay

## 2020-08-13 ENCOUNTER — Ambulatory Visit: Payer: 59 | Admitting: Internal Medicine

## 2020-08-13 ENCOUNTER — Encounter: Payer: Self-pay | Admitting: Internal Medicine

## 2020-08-13 VITALS — BP 118/70 | HR 77 | Temp 97.8°F | Resp 16 | Ht 62.0 in | Wt 197.0 lb

## 2020-08-13 DIAGNOSIS — E1165 Type 2 diabetes mellitus with hyperglycemia: Secondary | ICD-10-CM | POA: Diagnosis not present

## 2020-08-13 DIAGNOSIS — W57XXXA Bitten or stung by nonvenomous insect and other nonvenomous arthropods, initial encounter: Secondary | ICD-10-CM | POA: Diagnosis not present

## 2020-08-13 DIAGNOSIS — R7989 Other specified abnormal findings of blood chemistry: Secondary | ICD-10-CM

## 2020-08-13 DIAGNOSIS — R945 Abnormal results of liver function studies: Secondary | ICD-10-CM

## 2020-08-13 DIAGNOSIS — E7800 Pure hypercholesterolemia, unspecified: Secondary | ICD-10-CM

## 2020-08-13 DIAGNOSIS — F439 Reaction to severe stress, unspecified: Secondary | ICD-10-CM | POA: Diagnosis not present

## 2020-08-13 DIAGNOSIS — S30861A Insect bite (nonvenomous) of abdominal wall, initial encounter: Secondary | ICD-10-CM | POA: Diagnosis not present

## 2020-08-13 DIAGNOSIS — I10 Essential (primary) hypertension: Secondary | ICD-10-CM

## 2020-08-13 LAB — CBC WITH DIFFERENTIAL/PLATELET
Basophils Absolute: 0 10*3/uL (ref 0.0–0.1)
Basophils Relative: 0.8 % (ref 0.0–3.0)
Eosinophils Absolute: 0.1 10*3/uL (ref 0.0–0.7)
Eosinophils Relative: 2.5 % (ref 0.0–5.0)
HCT: 38.9 % (ref 36.0–46.0)
Hemoglobin: 13.6 g/dL (ref 12.0–15.0)
Lymphocytes Relative: 36 % (ref 12.0–46.0)
Lymphs Abs: 2.1 10*3/uL (ref 0.7–4.0)
MCHC: 34.9 g/dL (ref 30.0–36.0)
MCV: 85 fl (ref 78.0–100.0)
Monocytes Absolute: 0.5 10*3/uL (ref 0.1–1.0)
Monocytes Relative: 8.7 % (ref 3.0–12.0)
Neutro Abs: 3.1 10*3/uL (ref 1.4–7.7)
Neutrophils Relative %: 52 % (ref 43.0–77.0)
Platelets: 258 10*3/uL (ref 150.0–400.0)
RBC: 4.58 Mil/uL (ref 3.87–5.11)
RDW: 14.1 % (ref 11.5–15.5)
WBC: 5.9 10*3/uL (ref 4.0–10.5)

## 2020-08-13 LAB — LIPID PANEL
Cholesterol: 153 mg/dL (ref 0–200)
HDL: 40.4 mg/dL
LDL Cholesterol: 89 mg/dL (ref 0–99)
NonHDL: 112.28
Total CHOL/HDL Ratio: 4
Triglycerides: 114 mg/dL (ref 0.0–149.0)
VLDL: 22.8 mg/dL (ref 0.0–40.0)

## 2020-08-13 LAB — BASIC METABOLIC PANEL WITH GFR
BUN: 16 mg/dL (ref 6–23)
CO2: 26 meq/L (ref 19–32)
Calcium: 9.7 mg/dL (ref 8.4–10.5)
Chloride: 103 meq/L (ref 96–112)
Creatinine, Ser: 0.75 mg/dL (ref 0.40–1.20)
GFR: 84.36 mL/min
Glucose, Bld: 137 mg/dL — ABNORMAL HIGH (ref 70–99)
Potassium: 3.7 meq/L (ref 3.5–5.1)
Sodium: 138 meq/L (ref 135–145)

## 2020-08-13 LAB — HEPATIC FUNCTION PANEL
ALT: 56 U/L — ABNORMAL HIGH (ref 0–35)
AST: 30 U/L (ref 0–37)
Albumin: 4.6 g/dL (ref 3.5–5.2)
Alkaline Phosphatase: 56 U/L (ref 39–117)
Bilirubin, Direct: 0.1 mg/dL (ref 0.0–0.3)
Total Bilirubin: 0.6 mg/dL (ref 0.2–1.2)
Total Protein: 7.3 g/dL (ref 6.0–8.3)

## 2020-08-13 LAB — HEMOGLOBIN A1C: Hgb A1c MFr Bld: 6.8 % — ABNORMAL HIGH (ref 4.6–6.5)

## 2020-08-13 MED ORDER — MUPIROCIN 2 % EX OINT
1.0000 "application " | TOPICAL_OINTMENT | Freq: Two times a day (BID) | CUTANEOUS | 0 refills | Status: DC
  Filled 2020-08-13: qty 22, 11d supply, fill #0

## 2020-08-13 NOTE — Progress Notes (Signed)
[name], female   DOB: [birth year], 65 y.o.   MRN: [id number]   Subjective:    [name], female    DOB: 06-10-1955, 65 y.o.   MRN: [id number]  HPI This visit occurred during the SARS-CoV-2 public health emergency.  Safety protocols were in place, including screening questions prior to the visit, additional usage of staff PPE, and extensive cleaning of exam room while observing appropriate contact time as indicated for disinfecting solutions.  Patient here for a scheduled follow up. Here to follow up regarding her blood pressure and cholesterol.  She is doing well.  Working.  Handling stress.  No chest pain or sob reported.  No abdominal pain or bowel change reported.  Blood pressure doing well.  Found a tick - abdomen and leg.  No fever.  No joint aches or rash.     Past Medical History:  Diagnosis Date  . Abnormal liver function tests   . Allergy   . Anemia    H/O  . Arthritis    RA  . Depression   . Diabetes mellitus without complication ([place])   . Fatty liver   . Hypercholesterolemia   . Hypertension   . Migraine headache    better since menopause  . Neuromuscular disorder ([place])   . Recurrent sinusitis    allergies  . Tuberculosis 1980'S   + TB TEST-WAS TREATED   Past Surgical History:  Procedure Laterality Date  . BILATERAL CARPAL TUNNEL RELEASE    . BREAST BIOPSY Left 05/12/2017   FRAGMENTS WITH FEATURES OF AN EARLY INTRADUCTAL PAPILLOMA WITH aprocine changes  . BREAST BIOPSY Left 06/12/2017   Procedure: BREAST BIOPSY;  Surgeon: Robert Bellow, MD;  Location: [facility];  Service: General;  Laterality: Left;  . BREAST DUCTAL SYSTEM EXCISION Left 06/12/2017   Procedure: EXCISION DUCTAL SYSTEM BREAST;  Surgeon: Robert Bellow, MD;  Location: [facility];  Service: General;  Laterality: Left;  . BREAST EXCISIONAL BIOPSY Left 2004   x 2  . COLONOSCOPY WITH PROPOFOL N/A 02/11/2016   Procedure: COLONOSCOPY WITH PROPOFOL;  Surgeon: Lucilla Lame, MD;  Location: [place];  Service: Endoscopy;  Laterality: N/A;  . 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 FRACTURE SURGERY  2017  . OSTECTOMY Left 06/01/2015   Procedure: OSTECTOMY/ part. excision fracture fragment left 4th toe;  Surgeon: Sharlotte Alamo, DPM;  Location: [facility];  Service: Podiatry;  Laterality: Left;  . POLYPECTOMY N/A 02/11/2016   Procedure: POLYPECTOMY;  Surgeon: Lucilla Lame, MD;  Location: [place];  Service: Endoscopy;  Laterality: N/A;  . RHINOPLASTY    . TUBAL LIGATION  1997   Family History  Problem Relation Age of Onset  . Heart disease Father        died age 67 (MI)  . Heart disease Other        4 uncles - CABG  . Colon cancer Other        great aunt  . Heart disease Paternal Grandmother        first MI - 83s  . Migraines Mother   . Breast cancer Mother 49  . Hypertension Mother   . Hypertension Maternal Grandmother   . Early death Maternal Grandmother   . Diabetes Paternal Grandfather   . Stroke Paternal Grandfather   . Depression  Maternal Aunt   . Breast cancer Other        1 mat and 1 pat  gr aunt   Social History   Socioeconomic History  . Marital status: Married    Spouse name: Not on file  . Number of children: 2  . Years of education: Not on file  . Highest education level: Not on file  Occupational History  . Not on file  Tobacco Use  . Smoking status: Never Smoker  . Smokeless tobacco: Never Used  Vaping Use  . Vaping Use: Never used  Substance and Sexual Activity  . Alcohol use: Yes    Alcohol/week: 1.0 standard drink    Types: 1 Cans of beer per week    Comment: occasional  . Drug use: No  . Sexual activity: Yes    Birth control/protection: Post-menopausal, Surgical  Other Topics Concern  . Not on file  Social History Narrative  . Not on file   Social  Determinants of Health   Financial Resource Strain: Not on file  Food Insecurity: Not on file  Transportation Needs: Not on file  Physical Activity: Not on file  Stress: Not on file  Social Connections: Not on file    Outpatient Encounter Medications as of 08/13/2020  Medication Sig  . mupirocin ointment (BACTROBAN) 2 % Apply 1 application topically 2 (two) times daily.  Marland Kitchen acetaminophen (TYLENOL) 500 MG tablet Take 1,000 mg by mouth daily as needed for moderate pain.   Marland Kitchen amLODipine (NORVASC) 10 MG tablet TAKE 1 TABLET BY MOUTH DAILY.  . fexofenadine-pseudoephedrine (ALLEGRA-D) 60-120 MG 12 hr tablet Take 1 tablet by mouth daily as needed (allergies).   . hydrochlorothiazide (HYDRODIURIL) 25 MG tablet TAKE 1 TABLET BY MOUTH DAILY  . ibuprofen (ADVIL,MOTRIN) 200 MG tablet Take 600-800 mg by mouth daily as needed (arthritis).   Marland Kitchen lisinopril (ZESTRIL) 40 MG tablet TAKE 1 TABLET BY MOUTH DAILY.  Marland Kitchen loratadine (CLARITIN) 10 MG tablet Take 10 mg by mouth daily.   . magnesium oxide (MAG-OX) 400 MG tablet Take 1 tablet (400 mg total) by mouth daily.  . metFORMIN (GLUCOPHAGE-XR) 500 MG 24 hr tablet TAKE 1 TABLET BY MOUTH DAILY WITH BREAKFAST  . naproxen sodium (ANAPROX) 220 MG tablet Take 440 mg by mouth daily as needed (pain).   Marland Kitchen PARoxetine (PAXIL) 10 MG tablet TAKE 1 TABLET BY MOUTH EVERY MORNING  . rosuvastatin (CRESTOR) 5 MG tablet TAKE 1 TABLET (5 MG TOTAL) BY MOUTH DAILY.  . [DISCONTINUED] fluticasone (FLONASE) 50 MCG/ACT nasal spray Place 2 sprays into the nose daily as needed for allergies.    No facility-administered encounter medications on file as of 08/13/2020.    Review of Systems  Constitutional: Negative for appetite change and unexpected weight change.  HENT: Negative for congestion and sinus pressure.   Respiratory: Negative for cough, chest tightness and shortness of breath.   Cardiovascular: Negative for chest pain, palpitations and leg swelling.  Gastrointestinal: Negative  for abdominal pain, diarrhea, nausea and vomiting.  Genitourinary: Negative for difficulty urinating and dysuria.  Musculoskeletal: Negative for joint swelling and myalgias.  Skin: Negative for color change and rash.  Neurological: Negative for dizziness, light-headedness and headaches.  Psychiatric/Behavioral: Negative for agitation and dysphoric mood.       Objective:    Physical Exam Vitals reviewed.  Constitutional:      General: She is not in acute distress.    Appearance: Normal appearance.  HENT:     Head: Normocephalic and atraumatic.  Right Ear: External ear normal.     Left Ear: External ear normal.  Eyes:     General: No scleral icterus.       Right eye: No discharge.        Left eye: No discharge.     Conjunctiva/sclera: Conjunctivae normal.  Neck:     Thyroid: No thyromegaly.  Cardiovascular:     Rate and Rhythm: Normal rate and regular rhythm.  Pulmonary:     Effort: No respiratory distress.     Breath sounds: Normal breath sounds. No wheezing.  Abdominal:     General: Bowel sounds are normal.     Palpations: Abdomen is soft.     Tenderness: There is no abdominal tenderness.  Musculoskeletal:        General: No swelling or tenderness.     Cervical back: Neck supple. No tenderness.  Lymphadenopathy:     Cervical: No cervical adenopathy.  Skin:    Findings: No erythema or rash.  Neurological:     Mental Status: She is alert.  Psychiatric:        Mood and Affect: Mood normal.        Behavior: Behavior normal.     BP 118/70   Pulse 77   Temp 97.8 F (36.6 C)   Resp 16   Ht _0  (1.575 m)   Wt 197 lb (89.4 kg)   LMP 05/30/2007   SpO2 98%   BMI 36.03 kg/m  Wt Readings from Last 3 Encounters:  08/13/20 197 lb (89.4 kg)  04/09/20 197 lb (89.4 kg)  07/11/19 192 lb (87.1 kg)     Lab Results  Component Value Date   WBC 5.9 08/13/2020   HGB 13.6 08/13/2020   HCT 38.9 08/13/2020   PLT 258.0 08/13/2020   GLUCOSE 137 (H) 08/13/2020   CHOL  153 08/13/2020   TRIG 114.0 08/13/2020   HDL 40.40 08/13/2020   LDLDIRECT 130.0 07/11/2019   LDLCALC 89 08/13/2020   ALT 56 (H) 08/13/2020   AST 30 08/13/2020   NA 138 08/13/2020   K 3.7 08/13/2020   CL 103 08/13/2020   CREATININE 0.75 08/13/2020   BUN 16 08/13/2020   CO2 26 08/13/2020   TSH 2.84 04/09/2020   HGBA1C 6.8 (H) 08/13/2020   MICROALBUR 4.5 (H) 04/09/2020    MM 3D SCREEN BREAST BILATERAL  Result Date: 04/18/2020 CLINICAL DATA:  Screening. EXAM: DIGITAL SCREENING BILATERAL MAMMOGRAM WITH TOMO AND CAD COMPARISON:  Previous exam(s). ACR Breast Density Category c: The breast tissue is heterogeneously dense, which may obscure small masses. FINDINGS: There are no findings suspicious for malignancy. Images were processed with CAD. IMPRESSION: No mammographic evidence of malignancy. A result letter of this screening mammogram will be mailed directly to the patient. RECOMMENDATION: Screening mammogram in one year. (Code:SM-B-01Y) BI-RADS CATEGORY  1: Negative. Electronically Signed   By: Ammie Ferrier M.D.   On: 04/18/2020 08:12       Assessment & Plan:   Problem List Items Addressed This Visit    Abnormal liver function tests    Previous ultrasound revealed fatty liver.  Low carb diet , exercise and weight loss.  Follow liver function tests.  Denies increased alcohol intake.  Follow.       Hypercholesterolemia - Primary    Low carb diet and exercise.  On crestor.  Follow lipid panel and liver function tests.        Relevant Orders   Hepatic function panel (Completed)   Lipid  panel (Completed)   Hypertension    Continue hctz, amlodipine and lisinopril.  Follow pressures.  Follow metabolic panel.       Relevant Orders   CBC with Differential/Platelet (Completed)   Basic metabolic panel (Completed)   Stress    Doing well on paxil.  Follow.        Tick bite    No evidence of infection.  No fever, headache, rash or joint aches.  Follow. Notify me if any change in  symptoms.       Type 2 diabetes mellitus with hyperglycemia (HCC)    On metformin - elevated sugars. Low carb diet and exercise.  Follow met b and a1c.        Relevant Orders   Hemoglobin A1c (Completed)       Einar Pheasant, MD

## 2020-08-18 ENCOUNTER — Encounter: Payer: Self-pay | Admitting: Internal Medicine

## 2020-08-19 ENCOUNTER — Encounter: Payer: Self-pay | Admitting: Internal Medicine

## 2020-08-19 DIAGNOSIS — W57XXXA Bitten or stung by nonvenomous insect and other nonvenomous arthropods, initial encounter: Secondary | ICD-10-CM | POA: Insufficient documentation

## 2020-08-19 NOTE — Assessment & Plan Note (Addendum)
On metformin - elevated sugars. Low carb diet and exercise.  Follow met b and a1c.

## 2020-08-19 NOTE — Assessment & Plan Note (Signed)
Low carb diet and exercise.  On crestor.  Follow lipid panel and liver function tests.

## 2020-08-19 NOTE — Assessment & Plan Note (Signed)
Previous ultrasound revealed fatty liver.  Low carb diet , exercise and weight loss.  Follow liver function tests.  Denies increased alcohol intake.  Follow.

## 2020-08-19 NOTE — Assessment & Plan Note (Signed)
No evidence of infection.  No fever, headache, rash or joint aches.  Follow. Notify me if any change in symptoms.

## 2020-08-19 NOTE — Assessment & Plan Note (Signed)
Continue hctz, amlodipine and lisinopril.  Follow pressures.  Follow metabolic panel.  

## 2020-08-19 NOTE — Assessment & Plan Note (Signed)
Doing well on paxil.  Follow.

## 2020-08-25 MED FILL — Metformin HCl Tab ER 24HR 500 MG: ORAL | 30 days supply | Qty: 30 | Fill #1 | Status: AC

## 2020-08-28 ENCOUNTER — Other Ambulatory Visit: Payer: Self-pay

## 2020-09-22 ENCOUNTER — Other Ambulatory Visit: Payer: Self-pay | Admitting: Internal Medicine

## 2020-09-24 ENCOUNTER — Other Ambulatory Visit: Payer: Self-pay

## 2020-09-24 MED ORDER — METFORMIN HCL ER 500 MG PO TB24
ORAL_TABLET | Freq: Every day | ORAL | 5 refills | Status: DC
  Filled 2020-09-24: qty 30, 30d supply, fill #0
  Filled 2020-10-25: qty 30, 30d supply, fill #1
  Filled 2020-11-30: qty 30, 30d supply, fill #2
  Filled 2021-01-03: qty 30, 30d supply, fill #3
  Filled 2021-01-27: qty 30, 30d supply, fill #4
  Filled 2021-03-04: qty 30, 30d supply, fill #5

## 2020-10-08 ENCOUNTER — Other Ambulatory Visit: Payer: Self-pay

## 2020-10-08 ENCOUNTER — Other Ambulatory Visit: Payer: Self-pay | Admitting: Internal Medicine

## 2020-10-08 MED ORDER — PAROXETINE HCL 10 MG PO TABS
ORAL_TABLET | Freq: Every morning | ORAL | 1 refills | Status: DC
  Filled 2020-10-08: qty 90, 90d supply, fill #0
  Filled 2021-01-07: qty 90, 90d supply, fill #1

## 2020-10-08 MED ORDER — HYDROCHLOROTHIAZIDE 25 MG PO TABS
ORAL_TABLET | Freq: Every day | ORAL | 1 refills | Status: DC
  Filled 2020-10-08: qty 90, 90d supply, fill #0
  Filled 2021-01-07: qty 90, 90d supply, fill #1

## 2020-10-10 ENCOUNTER — Other Ambulatory Visit: Payer: Self-pay

## 2020-10-17 ENCOUNTER — Other Ambulatory Visit: Payer: Self-pay | Admitting: Internal Medicine

## 2020-10-17 ENCOUNTER — Other Ambulatory Visit: Payer: Self-pay

## 2020-10-18 ENCOUNTER — Other Ambulatory Visit: Payer: Self-pay

## 2020-10-18 MED ORDER — LISINOPRIL 40 MG PO TABS
ORAL_TABLET | Freq: Every day | ORAL | 1 refills | Status: DC
  Filled 2020-10-18: qty 90, 90d supply, fill #0
  Filled 2021-01-27: qty 90, 90d supply, fill #1

## 2020-10-18 MED ORDER — AMLODIPINE BESYLATE 10 MG PO TABS
ORAL_TABLET | Freq: Every day | ORAL | 1 refills | Status: DC
  Filled 2020-10-18: qty 90, 90d supply, fill #0
  Filled 2021-01-17: qty 90, 90d supply, fill #1

## 2020-10-25 ENCOUNTER — Other Ambulatory Visit: Payer: Self-pay

## 2020-10-25 ENCOUNTER — Other Ambulatory Visit: Payer: Self-pay | Admitting: Internal Medicine

## 2020-10-25 MED ORDER — ROSUVASTATIN CALCIUM 5 MG PO TABS
ORAL_TABLET | Freq: Every day | ORAL | 1 refills | Status: DC
  Filled 2020-10-25 – 2021-01-27 (×2): qty 90, 90d supply, fill #0
  Filled 2021-05-04: qty 90, 90d supply, fill #1

## 2020-11-02 ENCOUNTER — Other Ambulatory Visit: Payer: Self-pay

## 2020-11-30 ENCOUNTER — Other Ambulatory Visit: Payer: Self-pay

## 2020-12-24 ENCOUNTER — Encounter: Payer: 59 | Admitting: Internal Medicine

## 2020-12-26 ENCOUNTER — Encounter: Payer: Self-pay | Admitting: Internal Medicine

## 2020-12-31 ENCOUNTER — Ambulatory Visit (INDEPENDENT_AMBULATORY_CARE_PROVIDER_SITE_OTHER): Payer: 59 | Admitting: Internal Medicine

## 2020-12-31 ENCOUNTER — Other Ambulatory Visit: Payer: Self-pay

## 2020-12-31 ENCOUNTER — Encounter: Payer: Self-pay | Admitting: Internal Medicine

## 2020-12-31 VITALS — BP 120/72 | HR 77 | Temp 96.4°F | Ht 62.01 in | Wt 193.8 lb

## 2020-12-31 DIAGNOSIS — E1165 Type 2 diabetes mellitus with hyperglycemia: Secondary | ICD-10-CM | POA: Diagnosis not present

## 2020-12-31 DIAGNOSIS — Z0000 Encounter for general adult medical examination without abnormal findings: Secondary | ICD-10-CM

## 2020-12-31 DIAGNOSIS — Z1211 Encounter for screening for malignant neoplasm of colon: Secondary | ICD-10-CM

## 2020-12-31 DIAGNOSIS — R7989 Other specified abnormal findings of blood chemistry: Secondary | ICD-10-CM | POA: Diagnosis not present

## 2020-12-31 DIAGNOSIS — E7800 Pure hypercholesterolemia, unspecified: Secondary | ICD-10-CM | POA: Diagnosis not present

## 2020-12-31 DIAGNOSIS — I10 Essential (primary) hypertension: Secondary | ICD-10-CM | POA: Diagnosis not present

## 2020-12-31 DIAGNOSIS — F439 Reaction to severe stress, unspecified: Secondary | ICD-10-CM | POA: Diagnosis not present

## 2020-12-31 DIAGNOSIS — Z86010 Personal history of colonic polyps: Secondary | ICD-10-CM

## 2020-12-31 LAB — BASIC METABOLIC PANEL
BUN: 16 mg/dL (ref 6–23)
CO2: 26 mEq/L (ref 19–32)
Calcium: 9.9 mg/dL (ref 8.4–10.5)
Chloride: 104 mEq/L (ref 96–112)
Creatinine, Ser: 0.91 mg/dL (ref 0.40–1.20)
GFR: 66.71 mL/min (ref >60.00)
Glucose, Bld: 105 mg/dL — ABNORMAL HIGH (ref 70–99)
Potassium: 4.1 mEq/L (ref 3.5–5.1)
Sodium: 140 mEq/L (ref 135–145)

## 2020-12-31 LAB — LIPID PANEL
Cholesterol: 153 mg/dL (ref 0–200)
HDL: 38.7 mg/dL — ABNORMAL LOW (ref >39.00)
LDL Cholesterol: 97 mg/dL (ref 0–99)
NonHDL: 114.08
Total CHOL/HDL Ratio: 4
Triglycerides: 87 mg/dL (ref 0.0–149.0)
VLDL: 17.4 mg/dL (ref 0.0–40.0)

## 2020-12-31 LAB — HEPATIC FUNCTION PANEL
ALT: 54 U/L — ABNORMAL HIGH (ref 0–35)
AST: 30 U/L (ref 0–37)
Albumin: 4.6 g/dL (ref 3.5–5.2)
Alkaline Phosphatase: 61 U/L (ref 39–117)
Bilirubin, Direct: 0 mg/dL (ref 0.0–0.3)
Total Bilirubin: 0.5 mg/dL (ref 0.2–1.2)
Total Protein: 7 g/dL (ref 6.0–8.3)

## 2020-12-31 LAB — HEMOGLOBIN A1C: Hgb A1c MFr Bld: 6.6 % — ABNORMAL HIGH (ref 4.6–6.5)

## 2020-12-31 NOTE — Assessment & Plan Note (Signed)
Physical today 12/31/20.  PAP 06/14/18 - negative with negative HPV.  Colonoscopy 22/2017 - recommended f/u in 5 years. Refer back to GI for f/u colonoscopy.

## 2020-12-31 NOTE — Progress Notes (Signed)
[name], female   DOB: Jul 24, 1955, 65 y.o.   MRN: [id number]   Subjective:    [name], female    DOB: 08/24/1955, 65 y.o.   MRN: [id number]  This visit occurred during the SARS-CoV-2 public health emergency.  Safety protocols were in place, including screening questions prior to the visit, additional usage of staff PPE, and extensive cleaning of exam room while observing appropriate contact time as indicated for disinfecting solutions.   Patient here for her physical exam.   Chief Complaint  Patient presents with   Annual Exam   .   HPI She is doing well.  Working.  Appears to be handling stress.  Tries to stay active.  No chest pain or sob reported.  Discussed diet and exercise.  No acid reflux.  No abdominal pain.  Bowels moving.  Discussed allergies.  Discussed taking claritin daily.  Nasal spray.  Follow.  Blood pressures averaging 120s/60s.     Past Medical History:  Diagnosis Date   Abnormal liver function tests    Allergy    Anemia    H/O   Arthritis    RA   Depression    Diabetes mellitus without complication ([place])    Fatty liver    Hypercholesterolemia    Hypertension    Migraine headache    better since menopause   Neuromuscular disorder (HCC)    Recurrent sinusitis    allergies   Tuberculosis 1980'S   + TB TEST-WAS TREATED   Past Surgical History:  Procedure Laterality Date   BILATERAL CARPAL TUNNEL RELEASE     BREAST BIOPSY Left 05/12/2017   FRAGMENTS WITH FEATURES OF AN EARLY INTRADUCTAL PAPILLOMA WITH aprocine changes   BREAST BIOPSY Left 06/12/2017   Procedure: BREAST BIOPSY;  Surgeon: Robert Bellow, MD;  Location: [facility];  Service: General;  Laterality: Left;   BREAST DUCTAL SYSTEM EXCISION Left 06/12/2017   Procedure: EXCISION DUCTAL SYSTEM BREAST;  Surgeon: Robert Bellow, MD;  Location: [facility];  Service: General;  Laterality: Left;   BREAST EXCISIONAL BIOPSY Left 2004   x 2   COLONOSCOPY WITH PROPOFOL  N/A 02/11/2016   Procedure: COLONOSCOPY WITH PROPOFOL;  Surgeon: Lucilla Lame, MD;  Location: [place];  Service: Endoscopy;  Laterality: N/A;   EXCISION MORTON'S NEUROMA Left 06/01/2015   Procedure: EXCISION MORTON'S NEUROMA;  Surgeon: Sharlotte Alamo, DPM;  Location: [facility];  Service: Podiatry;  Laterality: Left;  Local w/ mac   FRACTURE SURGERY  2017   OSTECTOMY Left 06/01/2015   Procedure: OSTECTOMY/ part. excision fracture fragment left 4th toe;  Surgeon: Sharlotte Alamo, DPM;  Location: [facility];  Service: Podiatry;  Laterality: Left;   POLYPECTOMY N/A 02/11/2016   Procedure: POLYPECTOMY;  Surgeon: Lucilla Lame, MD;  Location: [place];  Service: Endoscopy;  Laterality: N/A;   RHINOPLASTY     TUBAL LIGATION  1997   Family History  Problem Relation Age of Onset   Heart disease Father        died age 52 (MI)   Heart disease Other        4 uncles - CABG   Colon cancer Other        great aunt   Heart disease Paternal Grandmother        first MI - 64s   Migraines Mother    Breast cancer Mother 53   Hypertension Mother    Hypertension Maternal Grandmother    Early death  Maternal Grandmother    Diabetes Paternal Grandfather    Stroke Paternal Grandfather    Depression Maternal Aunt    Breast cancer Other        1 mat and 1 pat  gr aunt   Social History   Socioeconomic History   Marital status: Married    Spouse name: Not on file   Number of children: 2   Years of education: Not on file   Highest education level: Not on file  Occupational History   Not on file  Tobacco Use   Smoking status: Never   Smokeless tobacco: Never  Vaping Use   Vaping Use: Never used  Substance and Sexual Activity   Alcohol use: Yes    Alcohol/week: 1.0 standard drink    Types: 1 Cans of beer per week    Comment: occasional   Drug use: No   Sexual activity: Yes    Birth control/protection: Post-menopausal, Surgical  Other Topics Concern   Not on file  Social History Narrative    Not on file   Social Determinants of Health   Financial Resource Strain: Not on file  Food Insecurity: Not on file  Transportation Needs: Not on file  Physical Activity: Not on file  Stress: Not on file  Social Connections: Not on file     Review of Systems  Constitutional:  Negative for appetite change and unexpected weight change.  HENT:  Negative for congestion, sinus pressure and sore throat.   Eyes:  Negative for pain and visual disturbance.  Respiratory:  Negative for cough, chest tightness and shortness of breath.   Cardiovascular:  Negative for chest pain, palpitations and leg swelling.  Gastrointestinal:  Negative for abdominal pain, diarrhea, nausea and vomiting.  Genitourinary:  Negative for difficulty urinating and dysuria.  Musculoskeletal:  Negative for joint swelling and myalgias.  Skin:  Negative for color change and rash.  Neurological:  Negative for dizziness, light-headedness and headaches.  Hematological:  Negative for adenopathy. Does not bruise/bleed easily.  Psychiatric/Behavioral:  Negative for agitation and dysphoric mood.       Objective:     BP 120/72   Pulse 77   Temp (!) 96.4 F (35.8 C)   Ht 5' 2.01" (1.575 m)   Wt 193 lb 12.8 oz (87.9 kg)   LMP 05/30/2007   SpO2 97%   BMI 35.44 kg/m  Wt Readings from Last 3 Encounters:  12/31/20 193 lb 12.8 oz (87.9 kg)  08/13/20 197 lb (89.4 kg)  04/09/20 197 lb (89.4 kg)    Physical Exam Vitals reviewed.  Constitutional:      General: She is not in acute distress.    Appearance: Normal appearance. She is well-developed.  HENT:     Head: Normocephalic and atraumatic.     Right Ear: External ear normal.     Left Ear: External ear normal.  Eyes:     General: No scleral icterus.       Right eye: No discharge.        Left eye: No discharge.     Conjunctiva/sclera: Conjunctivae normal.  Neck:     Thyroid: No thyromegaly.  Cardiovascular:     Rate and Rhythm: Normal rate and regular rhythm.   Pulmonary:     Effort: No tachypnea, accessory muscle usage or respiratory distress.     Breath sounds: Normal breath sounds. No decreased breath sounds or wheezing.  Chest:  Breasts:    Right: No inverted nipple, mass, nipple discharge or  tenderness (no axillary adenopathy).     Left: No inverted nipple, mass, nipple discharge or tenderness (no axilarry adenopathy).  Abdominal:     General: Bowel sounds are normal.     Palpations: Abdomen is soft.     Tenderness: There is no abdominal tenderness.  Musculoskeletal:        General: No swelling or tenderness.     Cervical back: Neck supple.  Lymphadenopathy:     Cervical: No cervical adenopathy.  Skin:    Findings: No erythema or rash.  Neurological:     Mental Status: She is alert and oriented to person, place, and time.  Psychiatric:        Mood and Affect: Mood normal.        Behavior: Behavior normal.     Outpatient Encounter Medications as of 12/31/2020  Medication Sig   acetaminophen (TYLENOL) 500 MG tablet Take 1,000 mg by mouth daily as needed for moderate pain.    amLODipine (NORVASC) 10 MG tablet TAKE 1 TABLET BY MOUTH DAILY.   fexofenadine-pseudoephedrine (ALLEGRA-D) 60-120 MG 12 hr tablet Take 1 tablet by mouth daily as needed (allergies).    hydrochlorothiazide (HYDRODIURIL) 25 MG tablet Take by mouth daily.   ibuprofen (ADVIL,MOTRIN) 200 MG tablet Take 600-800 mg by mouth daily as needed (arthritis).    lisinopril (ZESTRIL) 40 MG tablet TAKE 1 TABLET BY MOUTH DAILY.   loratadine (CLARITIN) 10 MG tablet Take 10 mg by mouth daily.   magnesium oxide (MAG-OX) 400 MG tablet Take 1 tablet (400 mg total) by mouth daily.   metFORMIN (GLUCOPHAGE-XR) 500 MG 24 hr tablet Take by mouth daily with breakfast.   mupirocin ointment (BACTROBAN) 2 % Apply 1 application topically 2 (two) times daily.   naproxen sodium (ANAPROX) 220 MG tablet Take 440 mg by mouth daily as needed (pain).    PARoxetine (PAXIL) 10 MG tablet Take by mouth  every morning.   rosuvastatin (CRESTOR) 5 MG tablet TAKE 1 TABLET (5 MG TOTAL) BY MOUTH DAILY.   No facility-administered encounter medications on file as of 12/31/2020.     Lab Results  Component Value Date   WBC 5.9 08/13/2020   HGB 13.6 08/13/2020   HCT 38.9 08/13/2020   PLT 258.0 08/13/2020   GLUCOSE 105 (H) 12/31/2020   CHOL 153 12/31/2020   TRIG 87.0 12/31/2020   HDL 38.70 (L) 12/31/2020   LDLDIRECT 130.0 07/11/2019   LDLCALC 97 12/31/2020   ALT 54 (H) 12/31/2020   AST 30 12/31/2020   NA 140 12/31/2020   K 4.1 12/31/2020   CL 104 12/31/2020   CREATININE 0.91 12/31/2020   BUN 16 12/31/2020   CO2 26 12/31/2020   TSH 2.84 04/09/2020   HGBA1C 6.6 (H) 12/31/2020   MICROALBUR 4.5 (H) 04/09/2020    MM 3D SCREEN BREAST BILATERAL  Result Date: 04/18/2020 CLINICAL DATA:  Screening. EXAM: DIGITAL SCREENING BILATERAL MAMMOGRAM WITH TOMO AND CAD COMPARISON:  Previous exam(s). ACR Breast Density Category c: The breast tissue is heterogeneously dense, which may obscure small masses. FINDINGS: There are no findings suspicious for malignancy. Images were processed with CAD. IMPRESSION: No mammographic evidence of malignancy. A result letter of this screening mammogram will be mailed directly to the patient. RECOMMENDATION: Screening mammogram in one year. (Code:SM-B-01Y) BI-RADS CATEGORY  1: Negative. Electronically Signed   By: Ammie Ferrier M.D.   On: 04/18/2020 08:12       Assessment & Plan:   Problem List Items Addressed This Visit  Abnormal liver function tests    Previous ultrasound revealed fatty liver.  Low carb diet, exercise and weight loss.  Follow liver function tests.  Recent check - ALT slightly elevated.   Follow.       Relevant Orders   Hepatic function panel (Completed)   Health care maintenance    Physical today 12/31/20.  PAP 06/14/18 - negative with negative HPV.  Colonoscopy 22/2017 - recommended f/u in 5 years. Refer back to GI for f/u colonoscopy.         History of colonic polyps    Colonoscopy 01/2016.  Recommended f/u in 5 years.  Refer to Dr Allen Norris for f/u colonoscopy.       Relevant Orders   Ambulatory referral to Gastroenterology   Hypercholesterolemia    Low carb diet and exercise.  On crestor.  Follow lipid panel and liver function tests.        Relevant Orders   Lipid panel (Completed)   Hypertension    Continue hctz, amlodipine and lisinopril.  Follow pressures.  Follow metabolic panel.       Relevant Orders   Basic metabolic panel (Completed)   Stress    Doing well on paxil.  Follow.        Type 2 diabetes mellitus with hyperglycemia (HCC)    On metformin - elevated sugars. Low carb diet and exercise.  Follow met b and a1c.        Relevant Orders   Hemoglobin A1c (Completed)   Other Visit Diagnoses     Routine general medical examination at a health care facility    -  Primary   Colon cancer screening       Relevant Orders   Ambulatory referral to Gastroenterology        Einar Pheasant, MD

## 2021-01-02 ENCOUNTER — Telehealth: Payer: Self-pay

## 2021-01-02 NOTE — Telephone Encounter (Signed)
LMTCB regarding labs 

## 2021-01-03 ENCOUNTER — Other Ambulatory Visit: Payer: Self-pay

## 2021-01-05 ENCOUNTER — Encounter: Payer: Self-pay | Admitting: Internal Medicine

## 2021-01-05 NOTE — Assessment & Plan Note (Signed)
Low carb diet and exercise.  On crestor.  Follow lipid panel and liver function tests.

## 2021-01-05 NOTE — Assessment & Plan Note (Signed)
Doing well on paxil.  Follow.

## 2021-01-05 NOTE — Assessment & Plan Note (Addendum)
Colonoscopy 01/2016.  Recommended f/u in 5 years.  Refer to Dr Allen Norris for f/u colonoscopy.

## 2021-01-05 NOTE — Assessment & Plan Note (Addendum)
Previous ultrasound revealed fatty liver.  Low carb diet, exercise and weight loss.  Follow liver function tests.  Recent check - ALT slightly elevated.   Follow.

## 2021-01-05 NOTE — Assessment & Plan Note (Signed)
Continue hctz, amlodipine and lisinopril.  Follow pressures.  Follow metabolic panel.  

## 2021-01-05 NOTE — Assessment & Plan Note (Signed)
On metformin - elevated sugars. Low carb diet and exercise.  Follow met b and a1c.

## 2021-01-07 ENCOUNTER — Other Ambulatory Visit: Payer: Self-pay

## 2021-01-07 DIAGNOSIS — E119 Type 2 diabetes mellitus without complications: Secondary | ICD-10-CM | POA: Diagnosis not present

## 2021-01-07 LAB — HM DIABETES EYE EXAM

## 2021-01-09 ENCOUNTER — Telehealth: Payer: Self-pay

## 2021-01-09 ENCOUNTER — Other Ambulatory Visit: Payer: Self-pay

## 2021-01-09 DIAGNOSIS — Z86010 Personal history of colonic polyps: Secondary | ICD-10-CM

## 2021-01-09 DIAGNOSIS — Z1211 Encounter for screening for malignant neoplasm of colon: Secondary | ICD-10-CM

## 2021-01-09 MED ORDER — PEG 3350-KCL-NA BICARB-NACL 420 G PO SOLR
4000.0000 mL | Freq: Once | ORAL | 0 refills | Status: AC
  Filled 2021-01-09: qty 4000, 1d supply, fill #0

## 2021-01-09 NOTE — Telephone Encounter (Signed)
Pt. Returning call about scheduling colonoscopy

## 2021-01-09 NOTE — Telephone Encounter (Signed)
Pt. Returning call she asked if she can be called on her work phone [phone]

## 2021-01-09 NOTE — Telephone Encounter (Signed)
Called patient no answer no way to leave a voicemail

## 2021-01-09 NOTE — Progress Notes (Signed)
Gastroenterology Pre-Procedure Review  Request Date: 02/18/2021 Requesting Physician: Dr. Allen Norris  PATIENT REVIEW QUESTIONS: The patient responded to the following health history questions as indicated:    1. Are you having any GI issues? no 2. Do you have a personal history of Polyps? yes (REMOVED) 3. Do you have a family history of Colon Cancer or Polyps? no 4. Diabetes Mellitus? no 5. Joint replacements in the past 12 months?no 6. Major health problems in the past 3 months?no 7. Any artificial heart valves, MVP, or defibrillator?no    MEDICATIONS & ALLERGIES:    Patient reports the following regarding taking any anticoagulation/antiplatelet therapy:   Plavix, Coumadin, Eliquis, Xarelto, Lovenox, Pradaxa, Brilinta, or Effient? no Aspirin? no  Patient confirms/reports the following medications:  Current Outpatient Medications  Medication Sig Dispense Refill   acetaminophen (TYLENOL) 500 MG tablet Take 1,000 mg by mouth daily as needed for moderate pain.      amLODipine (NORVASC) 10 MG tablet TAKE 1 TABLET BY MOUTH DAILY. 90 tablet 1   fexofenadine-pseudoephedrine (ALLEGRA-D) 60-120 MG 12 hr tablet Take 1 tablet by mouth daily as needed (allergies).      hydrochlorothiazide (HYDRODIURIL) 25 MG tablet Take by mouth daily. 90 tablet 1   ibuprofen (ADVIL,MOTRIN) 200 MG tablet Take 600-800 mg by mouth daily as needed (arthritis).      lisinopril (ZESTRIL) 40 MG tablet TAKE 1 TABLET BY MOUTH DAILY. 90 tablet 1   loratadine (CLARITIN) 10 MG tablet Take 10 mg by mouth daily.     magnesium oxide (MAG-OX) 400 MG tablet Take 1 tablet (400 mg total) by mouth daily. 90 tablet 1   metFORMIN (GLUCOPHAGE-XR) 500 MG 24 hr tablet Take by mouth daily with breakfast. 30 tablet 5   mupirocin ointment (BACTROBAN) 2 % Apply 1 application topically 2 (two) times daily. 22 g 0   naproxen sodium (ANAPROX) 220 MG tablet Take 440 mg by mouth daily as needed (pain).      PARoxetine (PAXIL) 10 MG tablet Take by  mouth every morning. 90 tablet 1   rosuvastatin (CRESTOR) 5 MG tablet TAKE 1 TABLET (5 MG TOTAL) BY MOUTH DAILY. 90 tablet 1   No current facility-administered medications for this visit.    Patient confirms/reports the following allergies:  No Known Allergies  No orders of the defined types were placed in this encounter.   AUTHORIZATION INFORMATION Primary Insurance: 1D#: Group #:  Secondary Insurance: 1D#: Group #:  SCHEDULE INFORMATION: Date: 02/18/2021 Time: Location:[facility]

## 2021-01-18 ENCOUNTER — Other Ambulatory Visit: Payer: Self-pay

## 2021-01-28 ENCOUNTER — Other Ambulatory Visit: Payer: Self-pay

## 2021-02-05 ENCOUNTER — Encounter: Payer: Self-pay | Admitting: Gastroenterology

## 2021-02-18 ENCOUNTER — Ambulatory Visit: Payer: 59 | Admitting: Anesthesiology

## 2021-02-18 ENCOUNTER — Other Ambulatory Visit: Payer: Self-pay

## 2021-02-18 ENCOUNTER — Ambulatory Visit
Admission: RE | Admit: 2021-02-18 | Discharge: 2021-02-18 | Disposition: A | Discharge status: Home / Self Care | Payer: 59 | Attending: Gastroenterology | Admitting: Gastroenterology

## 2021-02-18 ENCOUNTER — Encounter
Admission: RE | Disposition: A | Discharge status: Home / Self Care | Payer: Self-pay | Source: Home / Self Care | Attending: Gastroenterology

## 2021-02-18 ENCOUNTER — Encounter: Payer: Self-pay | Admitting: Gastroenterology

## 2021-02-18 DIAGNOSIS — K5730 Diverticulosis of large intestine without perforation or abscess without bleeding: Secondary | ICD-10-CM | POA: Diagnosis not present

## 2021-02-18 DIAGNOSIS — K640 First degree hemorrhoids: Secondary | ICD-10-CM | POA: Diagnosis not present

## 2021-02-18 DIAGNOSIS — Z86010 Personal history of colonic polyps: Secondary | ICD-10-CM | POA: Diagnosis not present

## 2021-02-18 DIAGNOSIS — Z1211 Encounter for screening for malignant neoplasm of colon: Secondary | ICD-10-CM | POA: Diagnosis not present

## 2021-02-18 HISTORY — PX: COLONOSCOPY WITH PROPOFOL: SHX5780

## 2021-02-18 LAB — GLUCOSE, CAPILLARY
Glucose-Capillary: 148 mg/dL — ABNORMAL HIGH (ref 70–99)
Glucose-Capillary: 131 mg/dL — ABNORMAL HIGH (ref 70–99)

## 2021-02-18 SURGERY — COLONOSCOPY WITH PROPOFOL
Anesthesia: General | Site: Rectum

## 2021-02-18 MED ORDER — STERILE WATER FOR IRRIGATION IR SOLN
Status: DC | PRN
  Administered 2021-02-18: 250 mL

## 2021-02-18 MED ORDER — LIDOCAINE HCL (CARDIAC) PF 100 MG/5ML IV SOSY
PREFILLED_SYRINGE | INTRAVENOUS | Status: DC | PRN
  Administered 2021-02-18: 60 mg via INTRAVENOUS

## 2021-02-18 MED ORDER — LACTATED RINGERS IV SOLN: INTRAVENOUS | Status: DC

## 2021-02-18 MED ORDER — SODIUM CHLORIDE 0.9 % IV SOLN: INTRAVENOUS | Status: DC

## 2021-02-18 MED ORDER — PROPOFOL 10 MG/ML IV BOLUS
INTRAVENOUS | Status: DC | PRN
  Administered 2021-02-18 (×2): 30 mg via INTRAVENOUS
  Administered 2021-02-18: 70 mg via INTRAVENOUS
  Administered 2021-02-18 (×2): 30 mg via INTRAVENOUS

## 2021-02-18 SURGICAL SUPPLY — 6 items
GOWN CVR UNV OPN BCK APRN NK (MISCELLANEOUS) ×2 IMPLANT
GOWN ISOL THUMB LOOP REG UNIV (MISCELLANEOUS) ×4
KIT PRC NS LF DISP ENDO (KITS) ×1 IMPLANT
KIT PROCEDURE OLYMPUS (KITS) ×2
MANIFOLD NEPTUNE II (INSTRUMENTS) ×2 IMPLANT
WATER STERILE IRR 250ML POUR (IV SOLUTION) ×2 IMPLANT

## 2021-02-18 NOTE — Anesthesia Preprocedure Evaluation (Signed)
Anesthesia Evaluation  Patient identified by MRN, date of birth, ID band Patient awake    Reviewed: Allergy & Precautions, H&P , NPO status , Patient's Chart, lab work & pertinent test results, reviewed documented beta blocker date and time   Airway Mallampati: II  TM Distance: >3 FB Neck ROM: full    Dental no notable dental hx.    Pulmonary neg pulmonary ROS,    Pulmonary exam normal breath sounds clear to auscultation       Cardiovascular Exercise Tolerance: Good hypertension,  Rhythm:regular Rate:Normal     Neuro/Psych  Headaches, PSYCHIATRIC DISORDERS Depression  Neuromuscular disease    GI/Hepatic negative GI ROS, Neg liver ROS,   Endo/Other  negative endocrine ROSdiabetes  Renal/GU negative Renal ROS  negative genitourinary   Musculoskeletal  (+) Arthritis , Rheumatoid disorders,    Abdominal Normal abdominal exam  (+) - obese,  Abdomen: soft.    Peds  Hematology negative hematology ROS (+) anemia ,   Anesthesia Other Findings   Reproductive/Obstetrics negative OB ROS                             Anesthesia Physical  Anesthesia Plan  ASA: 3  Anesthesia Plan: General   Post-op Pain Management: Minimal or no pain anticipated   Induction: Intravenous  PONV Risk Score and Plan: 3 and Treatment may vary due to age or medical condition and Propofol infusion  Airway Management Planned: Natural Airway and Nasal Cannula  Additional Equipment:   Intra-op Plan:   Post-operative Plan:   Informed Consent: I have reviewed the patients History and Physical, chart, labs and discussed the procedure including the risks, benefits and alternatives for the proposed anesthesia with the patient or authorized representative who has indicated his/her understanding and acceptance.     Dental Advisory Given  Plan Discussed with: CRNA  Anesthesia Plan Comments:         Anesthesia  Quick Evaluation  Patient Active Problem List   Diagnosis Date Noted  . Tick bite 08/19/2020  . Stress 11/21/2019  . Leg cramp 07/11/2019  . Immunization counseling 03/08/2019  . Type 2 diabetes mellitus with hyperglycemia ([place]) 11/28/2018  . Pain in left hip 10/11/2018  . Abnormal mammogram 02/22/2018  . Mass of upper outer quadrant of left breast 06/04/2017  . 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 History of colonic polyps 02/13/2016  . Special screening for malignant neoplasms, colon   . Benign neoplasm of ascending colon   . Rectal polyp   . Abdominal pain 12/01/2015  . Diabetes mellitus without complication ([place]) 40/98/1191  . Health care maintenance 05/04/2014  . Adult BMI 37.0-37.9 kg/sq m 05/04/2014  . Foot pain 11/22/2013  . Post-menopausal bleeding 07/13/2012  . Hypertension 03/18/2012  . Hypercholesterolemia 03/18/2012  . Abnormal liver function tests 03/18/2012  . Migraine headache 03/18/2012    CBC Latest Ref Rng & Units 08/13/2020 07/11/2019 04/27/2019  WBC 4.0 - 10.5 K/uL 5.9 6.8 9.0  Hemoglobin 12.0 - 15.0 g/dL 13.6 14.2 13.4  Hematocrit 36.0 - 46.0 % 38.9 40.7 39.7  Platelets 150.0 - 400.0 K/uL 258.0 273.0 324.0   BMP Latest Ref Rng & Units 12/31/2020 08/13/2020 04/09/2020  Glucose 70 - 99 mg/dL 105(H) 137(H) 99  BUN 6 - 23 mg/dL 16 16 17   Creatinine 0.40 - 1.20 mg/dL 0.91 0.75 0.76  Sodium 135 - 145 mEq/L 140 138 137  Potassium 3.5 - 5.1 mEq/L 4.1 3.7 3.8  Chloride 96 - 112 mEq/L 104 103 101  CO2 19 - 32 mEq/L 26 26 28   Calcium 8.4 - 10.5 mg/dL 9.9 9.7 10.2    Risks and benefits of anesthesia discussed at length, patient or surrogate demonstrates understanding. Appropriately NPO. Plan to proceed with anesthesia.  Champ Mungo, MD 02/18/21

## 2021-02-18 NOTE — Op Note (Signed)
[facility] Gastroenterology Patient Name: Kathleen Yu Procedure Date: 02/18/2021 8:27 AM MRN: [id number] Account #: [id number] Date of Birth: 05-20-1955 Admit Type: Outpatient Age: 65 Room: [facility] OR ROOM 01 Gender: Female Note Status: Finalized Instrument Name: [id number] Procedure:             Colonoscopy Indications:           High risk colon cancer surveillance: Personal history                         of colonic polyps Providers:             Lucilla Lame MD, MD Medicines:             Propofol per Anesthesia Complications:         No immediate complications. Procedure:             Pre-Anesthesia Assessment:                        - Prior to the procedure, a History and Physical was                         performed, and patient medications and allergies were                         reviewed. The patient's tolerance of previous                         anesthesia was also reviewed. The risks and benefits                         of the procedure and the sedation options and risks                         were discussed with the patient. All questions were                         answered, and informed consent was obtained. Prior                         Anticoagulants: The patient has taken no previous                         anticoagulant or antiplatelet agents. ASA Grade                         Assessment: II - A patient with mild systemic disease.                         After reviewing the risks and benefits, the patient                         was deemed in satisfactory condition to undergo the                         procedure.                        After obtaining informed consent, the colonoscope was  passed under direct vision. Throughout the procedure,                         the patient's blood pressure, pulse, and oxygen                         saturations were monitored continuously. The                         Colonoscope was  introduced through the anus and                         advanced to the the cecum, identified by appendiceal                         orifice and ileocecal valve. The colonoscopy was                         performed without difficulty. The patient tolerated                         the procedure well. The quality of the bowel                         preparation was good. Findings:      The perianal and digital rectal examinations were normal.      Multiple small-mouthed diverticula were found in the sigmoid colon.      Non-bleeding internal hemorrhoids were found during retroflexion. The       hemorrhoids were Grade I (internal hemorrhoids that do not prolapse). Impression:            - Diverticulosis in the sigmoid colon.                        - Non-bleeding internal hemorrhoids.                        - No specimens collected. Recommendation:        - Discharge patient to home.                        - Resume previous diet.                        - Continue present medications.                        - Repeat colonoscopy in 7 years for surveillance. Procedure Code(s):     --- Professional ---                        [phone], Colonoscopy, flexible; diagnostic, including                         collection of specimen(s) by brushing or washing, when                         performed (separate procedure) Diagnosis Code(s):     --- Professional ---  Z86.010, Personal history of colonic polyps CPT copyright 2019 [employer]. All rights reserved. The codes documented in this report are preliminary and upon coder review may  be revised to meet current compliance requirements. Lucilla Lame MD, MD 02/18/2021 8:56:11 AM This report has been signed electronically. Number of Addenda: 0 Note Initiated On: 02/18/2021 8:27 AM Scope Withdrawal Time: 0 hours 9 minutes 11 seconds  Total Procedure Duration: 0 hours 16 minutes 0 seconds  Estimated Blood Loss:   Estimated blood loss: none.      [facility]

## 2021-02-18 NOTE — Transfer of Care (Signed)
Immediate Anesthesia Transfer of Care Note  Patient: Kathleen Yu  Procedure(s) Performed: COLONOSCOPY WITH PROPOFOL (Rectum)  Patient Location: PACU  Anesthesia Type: General  Level of Consciousness: awake, alert  and patient cooperative  Airway and Oxygen Therapy: Patient Spontanous Breathing and Patient connected to supplemental oxygen  Post-op Assessment: Post-op Vital signs reviewed, Patient's Cardiovascular Status Stable, Respiratory Function Stable, Patent Airway and No signs of Nausea or vomiting  Post-op Vital Signs: Reviewed and stable  Complications: No notable events documented.

## 2021-02-18 NOTE — H&P (Signed)
Lucilla Lame, MD [address], [place] [address] Phone:[phone] Fax : [phone]  Primary Care Physician:  Einar Pheasant, MD Primary Gastroenterologist:  Dr. Allen Norris  Pre-Procedure History & Physical: HPI:  Kathleen Yu is a 65 y.o. female is here for an colonoscopy.   Past Medical History:  Diagnosis Date   Abnormal liver function tests    Allergy    Anemia    H/O   Arthritis    RA   Depression    Diabetes mellitus without complication ([place])    Fatty liver    Hypercholesterolemia    Hypertension    Migraine headache    better since menopause   Neuromuscular disorder (HCC)    Recurrent sinusitis    allergies   Tuberculosis 1980'S   + TB TEST-WAS TREATED    Past Surgical History:  Procedure Laterality Date   BILATERAL CARPAL TUNNEL RELEASE     BREAST BIOPSY Left 05/12/2017   FRAGMENTS WITH FEATURES OF AN EARLY INTRADUCTAL PAPILLOMA WITH aprocine changes   BREAST BIOPSY Left 06/12/2017   Procedure: BREAST BIOPSY;  Surgeon: Robert Bellow, MD;  Location: [facility];  Service: General;  Laterality: Left;   BREAST DUCTAL SYSTEM EXCISION Left 06/12/2017   Procedure: EXCISION DUCTAL SYSTEM BREAST;  Surgeon: Robert Bellow, MD;  Location: [facility];  Service: General;  Laterality: Left;   BREAST EXCISIONAL BIOPSY Left 2004   x 2   COLONOSCOPY WITH PROPOFOL N/A 02/11/2016   Procedure: COLONOSCOPY WITH PROPOFOL;  Surgeon: Lucilla Lame, MD;  Location: [facility];  Service: Endoscopy;  Laterality: N/A;   EXCISION MORTON'S NEUROMA Left 06/01/2015   Procedure: EXCISION MORTON'S NEUROMA;  Surgeon: Sharlotte Alamo, DPM;  Location: [facility];  Service: Podiatry;  Laterality: Left;  Local w/ mac   FRACTURE SURGERY  2017   OSTECTOMY Left 06/01/2015   Procedure: OSTECTOMY/ part. excision fracture fragment left 4th toe;  Surgeon: Sharlotte Alamo, DPM;  Location: [facility];  Service: Podiatry;  Laterality: Left;   POLYPECTOMY N/A 02/11/2016   Procedure:  POLYPECTOMY;  Surgeon: Lucilla Lame, MD;  Location: [facility];  Service: Endoscopy;  Laterality: N/A;   RHINOPLASTY     TUBAL LIGATION  1997    Prior to Admission medications   Medication Sig Start Date End Date Taking? Authorizing Provider  acetaminophen (TYLENOL) 500 MG tablet Take 1,000 mg by mouth daily as needed for moderate pain.    Yes [provider]  amLODipine (NORVASC) 10 MG tablet TAKE 1 TABLET BY MOUTH DAILY. 10/18/20 10/18/21 Yes Einar Pheasant, MD  fexofenadine-pseudoephedrine (ALLEGRA-D) 60-120 MG 12 hr tablet Take 1 tablet by mouth daily as needed (allergies).    Yes [provider]  hydrochlorothiazide (HYDRODIURIL) 25 MG tablet Take by mouth daily. 10/08/20 10/08/21 Yes Einar Pheasant, MD  ibuprofen (ADVIL,MOTRIN) 200 MG tablet Take 600-800 mg by mouth daily as needed (arthritis).    Yes [provider]  lisinopril (ZESTRIL) 40 MG tablet TAKE 1 TABLET BY MOUTH DAILY. 10/18/20 10/18/21 Yes Einar Pheasant, MD  loratadine (CLARITIN) 10 MG tablet Take 10 mg by mouth daily.   Yes [provider]  magnesium oxide (MAG-OX) 400 MG tablet Take 1 tablet (400 mg total) by mouth daily. 07/27/19  Yes Einar Pheasant, MD  metFORMIN (GLUCOPHAGE-XR) 500 MG 24 hr tablet Take by mouth daily with breakfast. 09/24/20 09/24/21 Yes Einar Pheasant, MD  mupirocin ointment (BACTROBAN) 2 % Apply 1 application topically 2 (two) times daily. 08/13/20  Yes  Einar Pheasant, MD  naproxen sodium (ANAPROX) 220 MG tablet Take 440 mg by mouth daily as needed (pain).    Yes [provider]  PARoxetine (PAXIL) 10 MG tablet Take by mouth every morning. 10/08/20 10/08/21 Yes Scott, Randell Patient, MD  rosuvastatin (CRESTOR) 5 MG tablet TAKE 1 TABLET (5 MG TOTAL) BY MOUTH DAILY. 10/25/20 10/25/21 Yes Einar Pheasant, MD    Allergies as of 01/09/2021   (No Known Allergies)    Family History  Problem Relation Age of Onset   Heart disease Father        died age 24 (MI)    Heart disease Other        4 uncles - CABG   Colon cancer Other        great aunt   Heart disease Paternal Grandmother        first MI - 24s   Migraines Mother    Breast cancer Mother 13   Hypertension Mother    Hypertension Maternal Grandmother    Early death Maternal Grandmother    Diabetes Paternal Grandfather    Stroke Paternal Grandfather    Depression Maternal Aunt    Breast cancer Other        1 mat and 1 pat  gr aunt    Social History   Socioeconomic History   Marital status: Married    Spouse name: Not on file   Number of children: 2   Years of education: Not on file   Highest education level: Not on file  Occupational History   Not on file  Tobacco Use   Smoking status: Never   Smokeless tobacco: Never  Vaping Use   Vaping Use: Never used  Substance and Sexual Activity   Alcohol use: Yes    Alcohol/week: 1.0 standard drink    Types: 1 Cans of beer per week    Comment: occasional   Drug use: No   Sexual activity: Yes    Birth control/protection: Post-menopausal, Surgical  Other Topics Concern   Not on file  Social History Narrative   Not on file   Social Determinants of Health   Financial Resource Strain: Not on file  Food Insecurity: Not on file  Transportation Needs: Not on file  Physical Activity: Not on file  Stress: Not on file  Social Connections: Not on file  Intimate Partner Violence: Not on file    Review of Systems: See HPI, otherwise negative ROS  Physical Exam: BP 123/76   Pulse 82   Temp 97.9 F (36.6 C) (Temporal)   Wt 88 kg   LMP 05/30/2007   SpO2 97%   BMI 35.47 kg/m  General:   Alert,  pleasant and cooperative in NAD Head:  Normocephalic and atraumatic. Neck:  Supple; no masses or thyromegaly. Lungs:  Clear throughout to auscultation.    Heart:  Regular rate and rhythm. Abdomen:  Soft, nontender and nondistended. Normal bowel sounds, without guarding, and without rebound.   Neurologic:  Alert and  oriented x4;   grossly normal neurologically.  Impression/Plan: Kathleen Yu is here for an colonoscopy to be performed for a history of adenomatous polyps on 2017   Risks, benefits, limitations, and alternatives regarding  colonoscopy have been reviewed with the patient.  Questions have been answered.  All parties agreeable.   Lucilla Lame, MD  02/18/2021, 7:47 AM

## 2021-02-18 NOTE — Anesthesia Postprocedure Evaluation (Signed)
Anesthesia Post Note  Patient: Kathleen Yu  Procedure(s) Performed: COLONOSCOPY WITH PROPOFOL (Rectum)     Patient location during evaluation: PACU Anesthesia Type: General Level of consciousness: awake and alert Pain management: pain level controlled Vital Signs Assessment: post-procedure vital signs reviewed and stable Respiratory status: spontaneous breathing, nonlabored ventilation, respiratory function stable and patient connected to nasal cannula oxygen Cardiovascular status: blood pressure returned to baseline and stable Postop Assessment: no apparent nausea or vomiting Anesthetic complications: no   No notable events documented.  Sinda Du

## 2021-02-18 NOTE — Anesthesia Procedure Notes (Signed)
Date/Time: 02/18/2021 8:34 AM Performed by: Dionne Bucy, CRNA Pre-anesthesia Checklist: Patient identified, Emergency Drugs available, Suction available, Patient being monitored and Timeout performed Patient Re-evaluated:Patient Re-evaluated prior to induction Oxygen Delivery Method: Nasal cannula Induction Type: IV induction Placement Confirmation: positive ETCO2

## 2021-02-19 ENCOUNTER — Encounter: Payer: Self-pay | Admitting: Gastroenterology

## 2021-03-04 ENCOUNTER — Other Ambulatory Visit: Payer: Self-pay

## 2021-04-07 ENCOUNTER — Other Ambulatory Visit: Payer: Self-pay | Admitting: Internal Medicine

## 2021-04-08 ENCOUNTER — Other Ambulatory Visit: Payer: Self-pay

## 2021-04-09 ENCOUNTER — Other Ambulatory Visit: Payer: Self-pay

## 2021-04-09 MED ORDER — METFORMIN HCL ER 500 MG PO TB24
500.0000 mg | ORAL_TABLET | Freq: Every day | ORAL | 5 refills | Status: DC
  Filled 2021-04-09: qty 30, 30d supply, fill #0
  Filled 2021-05-04: qty 30, 30d supply, fill #1

## 2021-04-21 ENCOUNTER — Other Ambulatory Visit: Payer: Self-pay | Admitting: Internal Medicine

## 2021-04-22 ENCOUNTER — Other Ambulatory Visit: Payer: Self-pay

## 2021-04-22 MED ORDER — HYDROCHLOROTHIAZIDE 25 MG PO TABS
25.0000 mg | ORAL_TABLET | Freq: Every day | ORAL | 1 refills | Status: DC
  Filled 2021-04-22: qty 90, 90d supply, fill #0
  Filled 2021-07-22: qty 90, 90d supply, fill #1

## 2021-04-22 MED ORDER — PAROXETINE HCL 10 MG PO TABS
10.0000 mg | ORAL_TABLET | Freq: Every morning | ORAL | 1 refills | Status: DC
  Filled 2021-04-22: qty 90, 90d supply, fill #0
  Filled 2021-07-22: qty 90, 90d supply, fill #1

## 2021-04-23 ENCOUNTER — Other Ambulatory Visit: Payer: Self-pay

## 2021-04-25 ENCOUNTER — Other Ambulatory Visit: Payer: Self-pay

## 2021-04-25 ENCOUNTER — Other Ambulatory Visit: Payer: Self-pay | Admitting: Internal Medicine

## 2021-04-25 MED ORDER — AMLODIPINE BESYLATE 10 MG PO TABS
ORAL_TABLET | Freq: Every day | ORAL | 1 refills | Status: DC
  Filled 2021-04-25: qty 90, 90d supply, fill #0
  Filled 2021-07-31: qty 90, 90d supply, fill #1

## 2021-05-04 ENCOUNTER — Other Ambulatory Visit: Payer: Self-pay | Admitting: Internal Medicine

## 2021-05-06 ENCOUNTER — Ambulatory Visit (INDEPENDENT_AMBULATORY_CARE_PROVIDER_SITE_OTHER): Payer: 59

## 2021-05-06 ENCOUNTER — Ambulatory Visit: Payer: 59 | Admitting: Internal Medicine

## 2021-05-06 ENCOUNTER — Other Ambulatory Visit: Payer: Self-pay

## 2021-05-06 VITALS — BP 116/80 | HR 89 | Temp 97.8°F | Resp 16 | Ht 62.0 in | Wt 196.0 lb

## 2021-05-06 DIAGNOSIS — E1165 Type 2 diabetes mellitus with hyperglycemia: Secondary | ICD-10-CM

## 2021-05-06 DIAGNOSIS — Z1231 Encounter for screening mammogram for malignant neoplasm of breast: Secondary | ICD-10-CM

## 2021-05-06 DIAGNOSIS — I10 Essential (primary) hypertension: Secondary | ICD-10-CM | POA: Diagnosis not present

## 2021-05-06 DIAGNOSIS — Z86010 Personal history of colonic polyps: Secondary | ICD-10-CM

## 2021-05-06 DIAGNOSIS — M1611 Unilateral primary osteoarthritis, right hip: Secondary | ICD-10-CM | POA: Diagnosis not present

## 2021-05-06 DIAGNOSIS — R1031 Right lower quadrant pain: Secondary | ICD-10-CM

## 2021-05-06 DIAGNOSIS — F439 Reaction to severe stress, unspecified: Secondary | ICD-10-CM | POA: Diagnosis not present

## 2021-05-06 DIAGNOSIS — R7989 Other specified abnormal findings of blood chemistry: Secondary | ICD-10-CM

## 2021-05-06 DIAGNOSIS — R1030 Lower abdominal pain, unspecified: Secondary | ICD-10-CM | POA: Diagnosis not present

## 2021-05-06 DIAGNOSIS — E7800 Pure hypercholesterolemia, unspecified: Secondary | ICD-10-CM | POA: Diagnosis not present

## 2021-05-06 LAB — BASIC METABOLIC PANEL
BUN: 15 mg/dL (ref 6–23)
CO2: 30 mEq/L (ref 19–32)
Calcium: 10 mg/dL (ref 8.4–10.5)
Chloride: 102 mEq/L (ref 96–112)
Creatinine, Ser: 0.77 mg/dL (ref 0.40–1.20)
GFR: 81.32 mL/min (ref >60.00)
Glucose, Bld: 144 mg/dL — ABNORMAL HIGH (ref 70–99)
Potassium: 4 mEq/L (ref 3.5–5.1)
Sodium: 139 mEq/L (ref 135–145)

## 2021-05-06 LAB — LIPID PANEL
Cholesterol: 164 mg/dL (ref 0–200)
HDL: 37.1 mg/dL — ABNORMAL LOW (ref >39.00)
LDL Cholesterol: 97 mg/dL (ref 0–99)
NonHDL: 126.98
Total CHOL/HDL Ratio: 4
Triglycerides: 148 mg/dL (ref 0.0–149.0)
VLDL: 29.6 mg/dL (ref 0.0–40.0)

## 2021-05-06 LAB — MICROALBUMIN / CREATININE URINE RATIO
Creatinine,U: 29.2 mg/dL
Microalb Creat Ratio: 6.8 mg/g (ref 0.0–30.0)
Microalb, Ur: 2 mg/dL — ABNORMAL HIGH (ref 0.0–1.9)

## 2021-05-06 LAB — HEPATIC FUNCTION PANEL
ALT: 48 U/L — ABNORMAL HIGH (ref 0–35)
AST: 27 U/L (ref 0–37)
Albumin: 4.6 g/dL (ref 3.5–5.2)
Alkaline Phosphatase: 59 U/L (ref 39–117)
Bilirubin, Direct: 0.1 mg/dL (ref 0.0–0.3)
Total Bilirubin: 0.8 mg/dL (ref 0.2–1.2)
Total Protein: 7.1 g/dL (ref 6.0–8.3)

## 2021-05-06 LAB — HEMOGLOBIN A1C: Hgb A1c MFr Bld: 7.4 % — ABNORMAL HIGH (ref 4.6–6.5)

## 2021-05-06 LAB — TSH: TSH: 2.04 u[IU]/mL (ref 0.35–5.50)

## 2021-05-06 MED ORDER — LISINOPRIL 40 MG PO TABS
ORAL_TABLET | Freq: Every day | ORAL | 1 refills | Status: DC
  Filled 2021-05-06: qty 90, 90d supply, fill #0
  Filled 2021-07-31: qty 90, 90d supply, fill #1

## 2021-05-06 NOTE — Progress Notes (Addendum)
[name], female   DOB: 1955/12/03, 66 y.o.   MRN: [id number]   Subjective:    [name], female    DOB: 1955/04/03, 66 y.o.   MRN: [id number]  This visit occurred during the SARS-CoV-2 public health emergency.  Safety protocols were in place, including screening questions prior to the visit, additional usage of staff PPE, and extensive cleaning of exam room while observing appropriate contact time as indicated for disinfecting solutions.   Patient here for a scheduled follow up.   Chief Complaint  Patient presents with   Hyperlipidemia   Diabetes   Hypertension   .   Hyperlipidemia Pertinent negatives include no chest pain, myalgias or shortness of breath.  Diabetes Pertinent negatives for hypoglycemia include no dizziness or headaches. Pertinent negatives for diabetes include no chest pain.  Hypertension Pertinent negatives include no chest pain, headaches, palpitations or shortness of breath.  Reports right groin pain.  Had a bad muscle cramp in the right groin 6 months ago.  Noticed 2-3 months ago - increased pain - right groin.  No known triggers.  Hurts to walk.  Limits her walking.  No known trauma or injury.  Also reports increased numbness/tingling - fourth and fifth finger right hand.  No specific times or triggers. No weakness.  No neck pain.  No arm pain or numbness or tingling.  No chest pain or sob.  No acid reflux or abdominal pain reported.  Interested in GLP 1- agonist to help with sugars/weight.  No bowel change reported.     Past Medical History:  Diagnosis Date   Abnormal liver function tests    Allergy    Anemia    H/O   Arthritis    RA   Depression    Diabetes mellitus without complication ([place])    Fatty liver    Hypercholesterolemia    Hypertension    Migraine headache    better since menopause   Neuromuscular disorder (HCC)    Recurrent sinusitis    allergies   Tuberculosis 1980'S   + TB TEST-WAS TREATED   Past  Surgical History:  Procedure Laterality Date   BILATERAL CARPAL TUNNEL RELEASE     BREAST BIOPSY Left 05/12/2017   FRAGMENTS WITH FEATURES OF AN EARLY INTRADUCTAL PAPILLOMA WITH aprocine changes   BREAST BIOPSY Left 06/12/2017   Procedure: BREAST BIOPSY;  Surgeon: Robert Bellow, MD;  Location: [facility];  Service: General;  Laterality: Left;   BREAST DUCTAL SYSTEM EXCISION Left 06/12/2017   Procedure: EXCISION DUCTAL SYSTEM BREAST;  Surgeon: Robert Bellow, MD;  Location: [facility];  Service: General;  Laterality: Left;   BREAST EXCISIONAL BIOPSY Left 2004   x 2   COLONOSCOPY WITH PROPOFOL N/A 02/11/2016   Procedure: COLONOSCOPY WITH PROPOFOL;  Surgeon: Lucilla Lame, MD;  Location: [facility];  Service: Endoscopy;  Laterality: N/A;   COLONOSCOPY WITH PROPOFOL N/A 02/18/2021   Procedure: COLONOSCOPY WITH PROPOFOL;  Surgeon: Lucilla Lame, MD;  Location: [facility];  Service: Endoscopy;  Laterality: N/A;   EXCISION MORTON'S NEUROMA Left 06/01/2015   Procedure: EXCISION MORTON'S NEUROMA;  Surgeon: Sharlotte Alamo, DPM;  Location: [facility];  Service: Podiatry;  Laterality: Left;  Local w/ mac   FRACTURE SURGERY  2017   OSTECTOMY Left 06/01/2015   Procedure: OSTECTOMY/ part. excision fracture fragment left 4th toe;  Surgeon: Sharlotte Alamo, DPM;  Location: [facility];  Service: Podiatry;  Laterality: Left;   POLYPECTOMY N/A 02/11/2016  Procedure: POLYPECTOMY;  Surgeon: Lucilla Lame, MD;  Location: [facility];  Service: Endoscopy;  Laterality: N/A;   RHINOPLASTY     TUBAL LIGATION  1997   Family History  Problem Relation Age of Onset   Heart disease Father        died age 50 (MI)   Heart disease Other        4 uncles - CABG   Colon cancer Other        great aunt   Heart disease Paternal Grandmother        first MI - 23s   Migraines Mother    Breast cancer Mother 65   Hypertension Mother    Hypertension Maternal Grandmother    Early death Maternal Grandmother     Diabetes Paternal Grandfather    Stroke Paternal Grandfather    Depression Maternal Aunt    Breast cancer Other        1 mat and 1 pat  gr aunt   Social History   Socioeconomic History   Marital status: Married    Spouse name: Not on file   Number of children: 2   Years of education: Not on file   Highest education level: Not on file  Occupational History   Not on file  Tobacco Use   Smoking status: Never   Smokeless tobacco: Never  Vaping Use   Vaping Use: Never used  Substance and Sexual Activity   Alcohol use: Yes    Alcohol/week: 1.0 standard drink    Types: 1 Cans of beer per week    Comment: occasional   Drug use: No   Sexual activity: Yes    Birth control/protection: Post-menopausal, Surgical  Other Topics Concern   Not on file  Social History Narrative   Not on file   Social Determinants of Health   Financial Resource Strain: Not on file  Food Insecurity: Not on file  Transportation Needs: Not on file  Physical Activity: Not on file  Stress: Not on file  Social Connections: Not on file     Review of Systems  Constitutional:  Negative for appetite change and unexpected weight change.  HENT:  Negative for congestion and sinus pressure.   Respiratory:  Negative for cough, chest tightness and shortness of breath.   Cardiovascular:  Negative for chest pain, palpitations and leg swelling.  Gastrointestinal:  Negative for abdominal pain, diarrhea, nausea and vomiting.  Genitourinary:  Negative for difficulty urinating and dysuria.  Musculoskeletal:  Negative for joint swelling and myalgias.       Right groin pain as outlined.    Skin:  Negative for color change and rash.  Neurological:  Negative for dizziness, light-headedness and headaches.  Psychiatric/Behavioral:  Negative for agitation and dysphoric mood.       Objective:     BP 116/80    Pulse 89    Temp 97.8 F (36.6 C)    Resp 16    Ht _0  (1.575 m)    Wt 196 lb (88.9 kg)    LMP 05/30/2007     SpO2 97%    BMI 35.85 kg/m  Wt Readings from Last 3 Encounters:  05/06/21 196 lb (88.9 kg)  02/18/21 194 lb (88 kg)  12/31/20 193 lb 12.8 oz (87.9 kg)    Physical Exam Vitals reviewed.  Constitutional:      General: She is not in acute distress.    Appearance: Normal appearance.  HENT:     Head: Normocephalic  and atraumatic.     Right Ear: External ear normal.     Left Ear: External ear normal.  Eyes:     General: No scleral icterus.       Right eye: No discharge.        Left eye: No discharge.     Conjunctiva/sclera: Conjunctivae normal.  Neck:     Thyroid: No thyromegaly.  Cardiovascular:     Rate and Rhythm: Normal rate and regular rhythm.  Pulmonary:     Effort: No respiratory distress.     Breath sounds: Normal breath sounds. No wheezing.  Abdominal:     General: Bowel sounds are normal.     Palpations: Abdomen is soft.     Tenderness: There is no abdominal tenderness.  Musculoskeletal:        General: No swelling or tenderness.     Cervical back: Neck supple. No tenderness.     Comments: Increased pain with rotation of upper leg and abduction right leg. Negative SLR   Lymphadenopathy:     Cervical: No cervical adenopathy.  Skin:    Findings: No erythema or rash.  Neurological:     Mental Status: She is alert.  Psychiatric:        Mood and Affect: Mood normal.        Behavior: Behavior normal.     Outpatient Encounter Medications as of 05/06/2021  Medication Sig   acetaminophen (TYLENOL) 500 MG tablet Take 1,000 mg by mouth daily as needed for moderate pain.    amLODipine (NORVASC) 10 MG tablet TAKE 1 TABLET BY MOUTH DAILY.   fexofenadine-pseudoephedrine (ALLEGRA-D) 60-120 MG 12 hr tablet Take 1 tablet by mouth daily as needed (allergies).    hydrochlorothiazide (HYDRODIURIL) 25 MG tablet Take 1 tablet (25 mg total) by mouth daily.   ibuprofen (ADVIL,MOTRIN) 200 MG tablet Take 600-800 mg by mouth daily as needed (arthritis).    loratadine (CLARITIN) 10 MG  tablet Take 10 mg by mouth daily.   magnesium oxide (MAG-OX) 400 MG tablet Take 1 tablet (400 mg total) by mouth daily. (Patient taking differently: Take 800 mg by mouth daily.)   metFORMIN (GLUCOPHAGE-XR) 500 MG 24 hr tablet Take 1 tablet (500 mg total) by mouth daily with breakfast.   mupirocin ointment (BACTROBAN) 2 % Apply 1 application topically 2 (two) times daily.   naproxen sodium (ANAPROX) 220 MG tablet Take 440 mg by mouth daily as needed (pain).    PARoxetine (PAXIL) 10 MG tablet Take 1 tablet (10 mg total) by mouth every morning.   rosuvastatin (CRESTOR) 5 MG tablet TAKE 1 TABLET (5 MG TOTAL) BY MOUTH DAILY.   [DISCONTINUED] lisinopril (ZESTRIL) 40 MG tablet TAKE 1 TABLET BY MOUTH DAILY.   No facility-administered encounter medications on file as of 05/06/2021.     Lab Results  Component Value Date   WBC 5.9 08/13/2020   HGB 13.6 08/13/2020   HCT 38.9 08/13/2020   PLT 258.0 08/13/2020   GLUCOSE 144 (H) 05/06/2021   CHOL 164 05/06/2021   TRIG 148.0 05/06/2021   HDL 37.10 (L) 05/06/2021   LDLDIRECT 130.0 07/11/2019   LDLCALC 97 05/06/2021   ALT 48 (H) 05/06/2021   AST 27 05/06/2021   NA 139 05/06/2021   K 4.0 05/06/2021   CL 102 05/06/2021   CREATININE 0.77 05/06/2021   BUN 15 05/06/2021   CO2 30 05/06/2021   TSH 2.04 05/06/2021   HGBA1C 7.4 (H) 05/06/2021   MICROALBUR 2.0 (H) 05/06/2021  Assessment & Plan:   Problem List Items Addressed This Visit     Abnormal liver function tests    Previous ultrasound revealed fatty liver.  Low carb diet, exercise and weight loss.  Follow liver function tests.       History of colonic polyps    Just had colonoscopy 01/2021 - Dr Allen Norris - diverticulosis and non bleeding internal hemorrhoids.  Recommended f/u colonoscopy in 7 years.        Hypercholesterolemia    Low carb diet and exercise.  On crestor.  Follow lipid panel and liver function tests.        Relevant Orders   Lipid panel (Completed)   Hepatic function  panel (Completed)   Basic metabolic panel (Completed)   TSH (Completed)   Hypertension    Continue hctz, amlodipine and lisinopril.  Follow pressures.  Follow metabolic panel.       Right groin pain    Persistent.  No known trauma or injury.  Given persistence, check xray.  Further w/up pending results.        Relevant Orders   DG Hip Unilat W OR W/O Pelvis 2-3 Views Right (Completed)   Stress    Increased stress as outlined.  Discussed.  Overall appears to be handling things relatively well.  Continue paxil.       Type 2 diabetes mellitus with hyperglycemia (HCC)    On metformin - elevated sugars. Low carb diet and exercise.  Follow met b and a1c. She is interested in starting GLP-1 agonist to help sugars and to help with weight loss.  CCM referral.        Relevant Orders   Hemoglobin A1c (Completed)   Microalbumin / creatinine urine ratio (Completed)   AMB Referral to [facility]   Other Visit Diagnoses     Visit for screening mammogram    -  Primary   Relevant Orders   MM 3D SCREEN BREAST BILATERAL        Einar Pheasant, MD

## 2021-05-07 ENCOUNTER — Telehealth: Payer: Self-pay

## 2021-05-07 ENCOUNTER — Other Ambulatory Visit: Payer: Self-pay

## 2021-05-07 ENCOUNTER — Encounter: Payer: Self-pay | Admitting: Internal Medicine

## 2021-05-07 MED ORDER — ROSUVASTATIN CALCIUM 10 MG PO TABS
10.0000 mg | ORAL_TABLET | Freq: Every day | ORAL | 1 refills | Status: DC
  Filled 2021-05-07: qty 90, 90d supply, fill #0
  Filled 2021-07-31: qty 90, 90d supply, fill #1

## 2021-05-07 MED ORDER — METFORMIN HCL ER 500 MG PO TB24
500.0000 mg | ORAL_TABLET | Freq: Two times a day (BID) | ORAL | 5 refills | Status: DC
  Filled 2021-05-07 (×2): qty 60, 30d supply, fill #0
  Filled 2021-06-08: qty 60, 30d supply, fill #1
  Filled 2021-07-14: qty 60, 30d supply, fill #2
  Filled 2021-08-31: qty 60, 30d supply, fill #3
  Filled 2021-10-05: qty 60, 30d supply, fill #4
  Filled 2021-11-02: qty 60, 30d supply, fill #5

## 2021-05-07 NOTE — Addendum Note (Signed)
Addended by: Alisa Graff on: 05/07/2021 03:27 AM   Modules accepted: Orders

## 2021-05-07 NOTE — Assessment & Plan Note (Signed)
Previous ultrasound revealed fatty liver.  Low carb diet, exercise and weight loss.  Follow liver function tests.

## 2021-05-07 NOTE — Chronic Care Management (AMB) (Signed)
°  Care Management   Note  05/07/2021 Name: Kathleen Yu MRN: [id number] DOB: [birth year]  Kathleen Yu is a 66 y.o. year old female who is a primary care patient of Einar Pheasant, MD. I reached out to Kathleen Yu by phone today in response to a referral sent by Ms. Jyasia L Marse's primary care provider.   [name] was given information about care management services today including:  Care management services include personalized support from designated clinical staff supervised by her physician, including individualized plan of care and coordination with other care providers 24/7 contact phone numbers for assistance for urgent and routine care needs. The patient may stop care management services at any time by phone call to the office staff.  Patient agreed to services and verbal consent obtained.   Follow up plan: Face to Face appointment with care management team member scheduled for: 05/22/2021  Noreene Larsson, [address], [place], [place] [address] Direct Dial: [phone] [email or web address]

## 2021-05-07 NOTE — Assessment & Plan Note (Signed)
On metformin - elevated sugars. Low carb diet and exercise.  Follow met b and a1c. She is interested in starting GLP-1 agonist to help sugars and to help with weight loss.  CCM referral.

## 2021-05-07 NOTE — Assessment & Plan Note (Signed)
Low carb diet and exercise.  On crestor.  Follow lipid panel and liver function tests.

## 2021-05-07 NOTE — Assessment & Plan Note (Signed)
Just had colonoscopy 01/2021 - Dr Wohl - diverticulosis and non bleeding internal hemorrhoids.  Recommended f/u colonoscopy in 7 years.   

## 2021-05-07 NOTE — Assessment & Plan Note (Signed)
Increased stress as outlined.  Discussed.  Overall appears to be handling things relatively well.  Continue paxil.

## 2021-05-07 NOTE — Assessment & Plan Note (Signed)
Continue hctz, amlodipine and lisinopril.  Follow pressures.  Follow metabolic panel.  

## 2021-05-07 NOTE — Assessment & Plan Note (Signed)
Persistent.  No known trauma or injury.  Given persistence, check xray.  Further w/up pending results.

## 2021-05-08 ENCOUNTER — Other Ambulatory Visit: Payer: Self-pay

## 2021-05-08 DIAGNOSIS — M25551 Pain in right hip: Secondary | ICD-10-CM

## 2021-05-08 DIAGNOSIS — R1031 Right lower quadrant pain: Secondary | ICD-10-CM

## 2021-05-09 ENCOUNTER — Encounter: Payer: Self-pay | Admitting: Internal Medicine

## 2021-05-17 ENCOUNTER — Encounter: Payer: Self-pay | Admitting: Internal Medicine

## 2021-05-21 ENCOUNTER — Ambulatory Visit: Payer: 59 | Admitting: Pharmacist

## 2021-05-21 ENCOUNTER — Other Ambulatory Visit: Payer: Self-pay

## 2021-05-21 ENCOUNTER — Ambulatory Visit: Payer: 59 | Admitting: Family

## 2021-05-21 ENCOUNTER — Ambulatory Visit: Payer: 59

## 2021-05-21 DIAGNOSIS — E1165 Type 2 diabetes mellitus with hyperglycemia: Secondary | ICD-10-CM

## 2021-05-21 MED ORDER — OZEMPIC (0.25 OR 0.5 MG/DOSE) 2 MG/1.5ML ~~LOC~~ SOPN
0.5000 mg | PEN_INJECTOR | SUBCUTANEOUS | 2 refills | Status: DC
  Filled 2021-05-21: qty 1.5, 28d supply, fill #0
  Filled 2021-07-02: qty 1.5, 28d supply, fill #1
  Filled 2021-08-06: qty 1.5, 28d supply, fill #2

## 2021-05-21 NOTE — Progress Notes (Signed)
Chief Complaint  Patient presents with   Diabetes    Kathleen Yu is a 66 y.o. year old female who was referred for medication management by their primary care provider, Einar Pheasant, MD. They presented for a face to face visit today.   SDOH: works in [employer]. 11-11 (Thurs/Fri, Mon/Tues);   Subjective: Diabetes:  Current medications: metformin XR 500 mg twice daily   Current glucose readings: fastings ~ 90s, checks 1-2 times monthly   Current meal patterns: reports goes to [employer] with friends on Sunday, 2 servings of beer - Breakfast: coffee w/ creamer, banana, bowl of shredded wheat or raisin bran; eggs on the days she works because lunch is later that day - Lunch: leftovers from supper the night before - Supper: smoked sausage, noodles/rice, some type of vegetable; goes out to eat ~1-2 times weekly - Drinks: water w/ lemon, 1-2 servings of alcohol 1-2 times weeks   Current physical activity: limited by hip pain - ortho appointment Friday  Hypertension:  Current medications: lisinopril 40 mg daily, HCTZ 25 mg daily, amlodipine 10 mg daily  Current blood pressure readings readings: checks at work, ~120s/70s  Hyperlipidemia/ASCVD Risk Reduction  Current lipid lowering medications: rosuvastatin 10 mg daily - just recently increased     Objective: Lab Results  Component Value Date   HGBA1C 7.4 (H) 05/06/2021    Lab Results  Component Value Date   CREATININE 0.77 05/06/2021   BUN 15 05/06/2021   NA 139 05/06/2021   K 4.0 05/06/2021   CL 102 05/06/2021   CO2 30 05/06/2021    Lab Results  Component Value Date   CHOL 164 05/06/2021   HDL 37.10 (L) 05/06/2021   LDLCALC 97 05/06/2021   LDLDIRECT 130.0 07/11/2019   TRIG 148.0 05/06/2021   CHOLHDL 4 05/06/2021    Medications Reviewed Today     Reviewed by De Hollingshead, RPH-CPP (Pharmacist) on 05/21/21 at 0907  Med List Status: <None>   Medication Order Taking? Sig Documenting Provider  Last Dose Status Informant  acetaminophen (TYLENOL) 500 MG tablet [id number] Yes Take 1,000 mg by mouth daily as needed for moderate pain.  [provider] Taking Active   amLODipine (NORVASC) 10 MG tablet [id number] Yes TAKE 1 TABLET BY MOUTH DAILY. Einar Pheasant, MD Taking Active   fexofenadine-pseudoephedrine (ALLEGRA-D) 60-120 MG 12 hr tablet [id number] Yes Take 1 tablet by mouth daily as needed (allergies).  [provider] Taking Active            Med Note Caryn Section, Marylyn Ishihara A   Wed Jun 03, 2017  5:33 PM)    hydrochlorothiazide (HYDRODIURIL) 25 MG tablet [id number] Yes Take 1 tablet (25 mg total) by mouth daily. Einar Pheasant, MD Taking Active   ibuprofen (ADVIL,MOTRIN) 200 MG tablet [id number] Yes Take 600-800 mg by mouth daily as needed (arthritis).  [provider] Taking Active            Med Note Caryn Section, Marylyn Ishihara A   Wed Jun 03, 2017  5:33 PM)    lisinopril (ZESTRIL) 40 MG tablet [id number] Yes TAKE 1 TABLET BY MOUTH DAILY. Einar Pheasant, MD Taking Active   loratadine (CLARITIN) 10 MG tablet [id number] Yes Take 10 mg by mouth daily. [provider] Taking Active            Med Note Caryn Section, Marylyn Ishihara A   Wed Jun 03, 2017  5:33 PM)    magnesium oxide (MAG-OX) 400 MG tablet  [id number] Yes Take 1 tablet (400 mg total) by mouth daily.  Patient taking differently: Take 400 mg by mouth 2 (two) times daily.   Einar Pheasant, MD Taking Active   metFORMIN (GLUCOPHAGE-XR) 500 MG 24 hr tablet [id number] Yes Take 1 tablet (500 mg total) by mouth 2 (two) times daily. Einar Pheasant, MD Taking Active   mupirocin ointment (BACTROBAN) 2 % [id number] Yes Apply 1 application topically 2 (two) times daily. Einar Pheasant, MD Taking Active   naproxen sodium (ANAPROX) 220 MG tablet [id number] Yes Take 440 mg by mouth daily as needed (pain).  [provider] Taking Active            Med Note Caryn Section, Marylyn Ishihara A   Wed Jun 03, 2017  5:33 PM)    PARoxetine (PAXIL) 10 MG tablet [id number]  Yes Take 1 tablet (10 mg total) by mouth every morning. Einar Pheasant, MD Taking Active   rosuvastatin (CRESTOR) 10 MG tablet [id number] Yes Take 1 tablet (10 mg total) by mouth daily. Einar Pheasant, MD Taking Active             Assessment/Plan:   Diabetes: - Currently uncontrolled - Reviewed long term cardiovascular and renal outcomes of uncontrolled blood sugar - Reviewed goal A1c, goal fasting, and goal 2 hour post prandial glucose - Reviewed dietary modifications including: reduced carbohydrate intake - Counseled on GLP1 agonists, including mechanism of action, side effects, and benefits. No personal or family history of medullary thyroid cancer, personal history of pancreatitis or gallbladder disease. Counseled on potential side effects of nausea, stomach upset, queasiness, constipation, and that these generally improve over time. Advised to contact our office with more severe symptoms, including nausea, diarrhea, stomach pain. Patient verbalized understanding. - Start Ozempic 0.25 mg weekly for 4 weeks, then increase to 0.5 mg weekly.  - Recommend to check glucose periodically   Hypertension: - Currently uncontrolled - Reviewed long term cardiovascular and renal outcomes of uncontrolled blood pressure - Reviewed appropriate blood pressure monitoring technique and reviewed goal blood pressure. Recommended to check home blood pressure and heart rate periodically - Recommend to continue current regimen at this time  Hyperlipidemia/ASCVD Risk Reduction: - Currently uncontrolled but anticipated to be improved  - Reviewed long term complications of uncontrolled cholesterol - Recommend to continue increased dose of rosuvastatin at this time    Follow Up Plan: video visit in 8 weeks  Catie Darnelle Maffucci, PharmD, [place], [place] [employer] [facility] [employer] [phone]

## 2021-05-21 NOTE — Patient Instructions (Addendum)
Clarke,   It was great meeting you today!  Start Ozempic 0.25 mg weekly for 4 weeks, then increase to 0.5 mg weekly. This medication may cause stomach upset, queasiness, or constipation, especially when first starting. This generally improves over time. Call our office if these symptoms occur and worsen, or if you have severe symptoms such as vomiting, diarrhea, or stomach pain.   Check your blood sugars periodically: 1) Fasting, first thing in the morning before breakfast and  2) 2 hours after your largest meal.   For a goal A1c of less than 7%, goal fasting readings are less than 130 and goal 2 hour after meal readings are less than 180.   It looks like Dr. Nicki Reaper ordered a mammogram. Call [facility] to schedule - [phone]  Let me know if you have any questions or concerns!  Catie Darnelle Maffucci, PharmD

## 2021-05-22 ENCOUNTER — Ambulatory Visit: Payer: 59

## 2021-05-23 ENCOUNTER — Other Ambulatory Visit: Payer: Self-pay

## 2021-05-23 ENCOUNTER — Ambulatory Visit
Admission: RE | Admit: 2021-05-23 | Discharge: 2021-05-23 | Disposition: A | Discharge status: Home / Self Care | Payer: 59 | Source: Ambulatory Visit | Attending: Internal Medicine | Admitting: Internal Medicine

## 2021-05-23 DIAGNOSIS — Z1231 Encounter for screening mammogram for malignant neoplasm of breast: Secondary | ICD-10-CM | POA: Insufficient documentation

## 2021-05-24 ENCOUNTER — Other Ambulatory Visit: Payer: Self-pay

## 2021-05-24 DIAGNOSIS — S76011A Strain of muscle, fascia and tendon of right hip, initial encounter: Secondary | ICD-10-CM | POA: Diagnosis not present

## 2021-05-24 MED ORDER — MELOXICAM 7.5 MG PO TABS
ORAL_TABLET | ORAL | 1 refills | Status: DC
  Filled 2021-05-24: qty 60, 30d supply, fill #0
  Filled 2021-07-02: qty 60, 30d supply, fill #1

## 2021-06-03 ENCOUNTER — Ambulatory Visit: Payer: Self-pay | Admitting: Pharmacist

## 2021-06-03 NOTE — Chronic Care Management (AMB) (Signed)
?  Care Management  ? ?Note ? ?06/03/2021 ?Name: Kathleen Yu MRN: [id number] DOB: 10-06-1955 ? ? ? ?Closing pharmacy case at this time. Will collaborate with Care Guide to outreach to schedule follow up with RN CM. Patient has clinic contact information for future questions or concerns.  ? ?Catie Darnelle Maffucci, PharmD, [place], CPP ?Clinical Pharmacist ?Therapist, music at [employer] ?[phone] ? ?

## 2021-06-10 ENCOUNTER — Other Ambulatory Visit: Payer: Self-pay

## 2021-07-03 ENCOUNTER — Other Ambulatory Visit: Payer: Self-pay

## 2021-07-03 ENCOUNTER — Ambulatory Visit: Payer: 59 | Attending: Orthopedic Surgery | Admitting: Physical Therapy

## 2021-07-03 ENCOUNTER — Encounter: Payer: Self-pay | Admitting: Physical Therapy

## 2021-07-03 DIAGNOSIS — M6281 Muscle weakness (generalized): Secondary | ICD-10-CM | POA: Diagnosis not present

## 2021-07-03 DIAGNOSIS — R262 Difficulty in walking, not elsewhere classified: Secondary | ICD-10-CM | POA: Insufficient documentation

## 2021-07-03 DIAGNOSIS — M25551 Pain in right hip: Secondary | ICD-10-CM | POA: Diagnosis not present

## 2021-07-03 NOTE — Patient Instructions (Signed)
Access Code: [email or web address] ?URL: [email or web address] ?Date: 07/03/2021 ?Prepared by: Blanche East ? ?Exercises ?- Bent Knee Fallouts  - 1 x daily - 7 x weekly - 1 sets - 5 reps - 5 sec  hold ?- Supine Hip External Rotation Stretch  - 1 x daily - 7 x weekly - 1-2 sets - 2-3 reps - 15 sec  hold ?- Long Sitting Hamstring Stretch  - 1 x daily - 7 x weekly - 1 sets - 2-3 reps - 20 sec hold ?

## 2021-07-03 NOTE — Therapy (Signed)
[facility] ?[place], [place], [address] ?Phone: [phone]   Fax:  [phone] ? ?Physical Therapy Evaluation ? ?Patient Details  ?Name: Kathleen Yu ?MRN: [id number] ?Date of Birth: [birth year] ?Referring Provider (PT): Mack Guise, MD ? ? ?Encounter Date: 07/03/2021 ? ? PT End of Session - 07/03/21 1524   ? ? Visit Number 1   ? Number of Visits 13   ? Date for PT Re-Evaluation 08/14/21   ? Authorization Type UMR   ? PT Start Time 1432   ? PT Stop Time 1515   ? PT Time Calculation (min) 43 min   ? Activity Tolerance Patient tolerated treatment well;No increased pain   ? Behavior During Therapy [facility] for tasks assessed/performed   ? ?  ?  ? ?  ? ? ?Past Medical History:  ?Diagnosis Date  ? Abnormal liver function tests   ? Allergy   ? Anemia   ? H/O  ? Arthritis   ? RA  ? Depression   ? Diabetes mellitus without complication ([place])   ? Fatty liver   ? Hypercholesterolemia   ? Hypertension   ? Migraine headache   ? better since menopause  ? Neuromuscular disorder ([place])   ? Recurrent sinusitis   ? allergies  ? Tuberculosis 1980'S  ? + TB TEST-WAS TREATED  ? ? ?Past Surgical History:  ?Procedure Laterality Date  ? BILATERAL CARPAL TUNNEL RELEASE    ? BREAST BIOPSY Left 05/12/2017  ? FRAGMENTS WITH FEATURES OF AN EARLY INTRADUCTAL PAPILLOMA WITH aprocine changes  ? BREAST BIOPSY Left 06/12/2017  ? Procedure: BREAST BIOPSY;  Surgeon: Robert Bellow, MD;  Location: [facility];  Service: General;  Laterality: Left;  ? BREAST DUCTAL SYSTEM EXCISION Left 06/12/2017  ? Procedure: EXCISION DUCTAL SYSTEM BREAST;  Surgeon: Robert Bellow, MD;  Location: [facility];  Service: General;  Laterality: Left;  ? BREAST EXCISIONAL BIOPSY Left 2004  ? x 2  ? COLONOSCOPY WITH PROPOFOL N/A 02/11/2016  ? Procedure: COLONOSCOPY WITH PROPOFOL;  Surgeon: Lucilla Lame, MD;  Location: [place] [facility];  Service: Endoscopy;  Laterality: N/A;  ? COLONOSCOPY WITH PROPOFOL N/A  02/18/2021  ? Procedure: COLONOSCOPY WITH PROPOFOL;  Surgeon: Lucilla Lame, MD;  Location: [facility];  Service: Endoscopy;  Laterality: N/A;  ? EXCISION MORTON'S NEUROMA Left 06/01/2015  ? Procedure: EXCISION MORTON'S NEUROMA;  Surgeon: Sharlotte Alamo, DPM;  Location: [facility];  Service: Podiatry;  Laterality: Left;  Local w/ mac  ? FRACTURE SURGERY  2017  ? OSTECTOMY Left 06/01/2015  ? Procedure: OSTECTOMY/ part. excision fracture fragment left 4th toe;  Surgeon: Sharlotte Alamo, DPM;  Location: [facility];  Service: Podiatry;  Laterality: Left;  ? POLYPECTOMY N/A 02/11/2016  ? Procedure: POLYPECTOMY;  Surgeon: Lucilla Lame, MD;  Location: [place] [facility];  Service: Endoscopy;  Laterality: N/A;  ? RHINOPLASTY    ? TUBAL LIGATION  1997  ? ? ?There were no vitals filed for this visit. ? ? ? Subjective Assessment - 07/03/21 1438   ? ? Subjective "I have been feeling better since taking the meloxicam. I do still feel some twinges."   ? Pertinent History 66 yo Female reports right groin pain. She went to see orthopedic MD and was diagnosed with hip strain. She was given meloxicam and she reports that it is a lot better. She reports pain has been going on for 4-5 months. She reports in Jan 2023 her pain was so severe  that she was limping almost every day. She reports with a lot of walking her hips will bother her more. She denies any numbness/tingling; Denies any falls. Patient does work in Recovery;   ? Limitations Walking   ? How long can you sit comfortably? NA- will have some discomfort if stretching right leg out to side   ? How long can you stand comfortably? 30 min, may be limited with soreness in back/fatigue;   ? How long can you walk comfortably? could walk a mile, would get tired- when feeling good; however when feeling more pain walking is limited to <1/4 of mile   ? Diagnostic tests X-rays: negative for acute abnormality, does show mild degenerative changes;   ? Patient Stated Goals Get rid of pain; obtain  some exercise to strengthen/stretch area;   ? Currently in Pain? Yes   ? Pain Score 3    ? Pain Location Hip   ? Pain Orientation Right;Medial   ? Pain Descriptors / Indicators Spasm;Sore;Tender   ? Pain Type Chronic pain   ? Pain Onset More than a month ago   ? Pain Frequency Intermittent   ? Aggravating Factors  spasms in groin will make hips feel worse; prolonged walking;   ? Pain Relieving Factors taking more magnesium and drinking more pickle juice   ? ?  ?  ? ?  ? ? ? ? ? [facility] PT Assessment - 07/03/21 0001   ? ?  ? Assessment  ? Medical Diagnosis right hip strain   ? Referring Provider (PT) Mack Guise, MD   ? Onset Date/Surgical Date 03/31/21   ? Hand Dominance Right   ? Next MD Visit --   none scheduled  ? Prior Therapy denies any PT for this condition   ?  ? Precautions  ? Precautions None   ? Required Braces or Orthoses --   none  ?  ? Restrictions  ? Weight Bearing Restrictions No   ?  ? Balance Screen  ? Has the patient fallen in the past 6 months No   ? Has the patient had a decrease in activity level because of a fear of falling?  No   ? Is the patient reluctant to leave their home because of a fear of falling?  No   ?  ? Home Environment  ? Living Environment Private residence   ? Living Arrangements Spouse/significant other   ? Available Help at Discharge Family   ? Type of Home House   ? Home Access Stairs to enter   ? Entrance Stairs-Number of Steps 3   able to negotiate reciprocally  ? Entrance Stairs-Rails None   ? Home Layout One level   ? [place] - single point;Walker - 2 wheels   ?  ? Prior Function  ? Level of Independence Independent;Independent with gait   ? Vocation Full time employment   ? 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 Leisure enjoys gardening;   ?  ? Cognition  ? Overall Cognitive Status Within Functional Limits for tasks assessed   ?  ? Observation/Other Assessments  ? Observations exhibits multiple trigger points along right hip  adductors which are tender to touch   ?  ? Sensation  ? Light Touch Appears Intact   ?  ? Coordination  ? Gross Motor Movements are Fluid and Coordinated Yes   ? Fine Motor Movements are Fluid and Coordinated Yes   ?  ? Posture/Postural  Control  ? Posture Comments WFL   ?  ? ROM / Strength  ? AROM / PROM / Strength AROM;Strength   ?  ? AROM  ? Overall AROM Comments BUE and BLE are WFL   ?  ? Strength  ? Strength Assessment Site Hip;Knee;Ankle   ? Right/Left Hip Right;Left   ? Right Hip Flexion 5/5   ? Right Hip Extension 3+/5   ? Right Hip ABduction 4/5   ? Right Hip ADduction 4-/5   ? Left Hip Flexion 5/5   ? Left Hip Extension 3+/5   ? Left Hip ABduction 4/5   ? Left Hip ADduction 4/5   ? Right/Left Knee Right;Left   ? Right Knee Flexion 5/5   ? Right Knee Extension 5/5   ? Left Knee Flexion 5/5   ? Left Knee Extension 5/5   ? Right/Left Ankle Right;Left   ? Right Ankle Dorsiflexion 5/5   ? Left Ankle Dorsiflexion 5/5   ?  ? Palpation  ? Palpation comment moderate tenderness to right hip adductors with trigger points noted   ?  ? Special Tests  ?  Special Tests Hip Special Tests   ? Hip Special Tests  Saralyn Pilar (FABER) Test;Ober's Test;Hip Scouring;Piriformis Test;Other   ?  ? Saralyn Pilar [facility]) Test  ? Findings Negative   ? Side Right   ?  ? Ober's Test  ? Findings Negative   ? Side Right   ?  ? Hip Scouring  ? Findings Negative   ? Side Right   ?  ? Piriformis Test  ? Findings Negative   ? Side  Right   ?  ? other  ? Comments Exhibits slight discomfort with FADIR but not severe   ?  ? Transfers  ? Comments independent in all transfers   ?  ? Ambulation/Gait  ? Gait Comments ambulates with reciprocal gait pattern, no abnormality noted   ?  ? High Level Balance  ? High Level Balance Comments no concerns noted   ? ?  ?  ? ?  ? ? ? ? ? ? ? ? ? ? ? ? ? ?Objective measurements completed on examination: See above findings.  ? ? ?Initiated HEP ?Hooklying hip abduction/ER (knee bent fall out) 5 sec hold x5 reps ?Figure 4  stretch 15 sec hold x2 reps in hooklying and sitting ? ?Long sitting with legs apart, leaning forward to stretch inner thigh 20 sec hold x1 reps; ? ?Provided written HEP, educated pt about dry needling, she is

## 2021-07-10 ENCOUNTER — Ambulatory Visit: Payer: 59

## 2021-07-10 DIAGNOSIS — R262 Difficulty in walking, not elsewhere classified: Secondary | ICD-10-CM | POA: Diagnosis not present

## 2021-07-10 DIAGNOSIS — M6281 Muscle weakness (generalized): Secondary | ICD-10-CM | POA: Diagnosis not present

## 2021-07-10 DIAGNOSIS — M25551 Pain in right hip: Secondary | ICD-10-CM | POA: Diagnosis not present

## 2021-07-10 NOTE — Therapy (Signed)
[facility] ?[place], [place], [address] ?Phone: [phone]   Fax:  [phone] ? ?Physical Therapy Treatment ? ?Patient Details  ?Name: Kathleen Yu ?MRN: [id number] ?Date of Birth: 05-09-1955 ?Referring Provider (PT): Mack Guise, MD ? ? ?Encounter Date: 07/10/2021 ? ? PT End of Session - 07/10/21 1353   ? ? Visit Number 2   ? Number of Visits 13   ? Date for PT Re-Evaluation 08/14/21   ? Authorization Type UMR   ? PT Start Time 1300   ? PT Stop Time 1344   ? PT Time Calculation (min) 44 min   ? Activity Tolerance Patient tolerated treatment well;No increased pain   ? Behavior During Therapy [facility] for tasks assessed/performed   ? ?  ?  ? ?  ? ? ?Past Medical History:  ?Diagnosis Date  ? Abnormal liver function tests   ? Allergy   ? Anemia   ? H/O  ? Arthritis   ? RA  ? Depression   ? Diabetes mellitus without complication ([place])   ? Fatty liver   ? Hypercholesterolemia   ? Hypertension   ? Migraine headache   ? better since menopause  ? Neuromuscular disorder ([place])   ? Recurrent sinusitis   ? allergies  ? Tuberculosis 1980'S  ? + TB TEST-WAS TREATED  ? ? ?Past Surgical History:  ?Procedure Laterality Date  ? BILATERAL CARPAL TUNNEL RELEASE    ? BREAST BIOPSY Left 05/12/2017  ? FRAGMENTS WITH FEATURES OF AN EARLY INTRADUCTAL PAPILLOMA WITH aprocine changes  ? BREAST BIOPSY Left 06/12/2017  ? Procedure: BREAST BIOPSY;  Surgeon: Robert Bellow, MD;  Location: [facility];  Service: General;  Laterality: Left;  ? BREAST DUCTAL SYSTEM EXCISION Left 06/12/2017  ? Procedure: EXCISION DUCTAL SYSTEM BREAST;  Surgeon: Robert Bellow, MD;  Location: [facility];  Service: General;  Laterality: Left;  ? BREAST EXCISIONAL BIOPSY Left 2004  ? x 2  ? COLONOSCOPY WITH PROPOFOL N/A 02/11/2016  ? Procedure: COLONOSCOPY WITH PROPOFOL;  Surgeon: Lucilla Lame, MD;  Location: [place];  Service: Endoscopy;  Laterality: N/A;  ? COLONOSCOPY WITH PROPOFOL N/A  02/18/2021  ? Procedure: COLONOSCOPY WITH PROPOFOL;  Surgeon: Lucilla Lame, MD;  Location: [facility];  Service: Endoscopy;  Laterality: N/A;  ? EXCISION MORTON'S NEUROMA Left 06/01/2015  ? Procedure: EXCISION MORTON'S NEUROMA;  Surgeon: Sharlotte Alamo, DPM;  Location: [facility];  Service: Podiatry;  Laterality: Left;  Local w/ mac  ? FRACTURE SURGERY  2017  ? OSTECTOMY Left 06/01/2015  ? Procedure: OSTECTOMY/ part. excision fracture fragment left 4th toe;  Surgeon: Sharlotte Alamo, DPM;  Location: [facility];  Service: Podiatry;  Laterality: Left;  ? POLYPECTOMY N/A 02/11/2016  ? Procedure: POLYPECTOMY;  Surgeon: Lucilla Lame, MD;  Location: [facility];  Service: Endoscopy;  Laterality: N/A;  ? RHINOPLASTY    ? TUBAL LIGATION  1997  ? ? ?There were no vitals filed for this visit. ? ? Subjective Assessment - 07/10/21 1349   ? ? Subjective Patient reports she is on call tonight. Still having soreness in adductor region.   ? Pertinent History 66 yo Female reports right groin pain. She went to see orthopedic MD and was diagnosed with hip strain. She was given meloxicam and she reports that it is a lot better. She reports pain has been going on for 4-5 months. She reports in Jan 2023 her pain was so severe that she was  limping almost every day. She reports with a lot of walking her hips will bother her more. She denies any numbness/tingling; Denies any falls. Patient does work in Recovery;   ? Limitations Walking   ? How long can you sit comfortably? NA- will have some discomfort if stretching right leg out to side   ? How long can you stand comfortably? 30 min, may be limited with soreness in back/fatigue;   ? How long can you walk comfortably? could walk a mile, would get tired- when feeling good; however when feeling more pain walking is limited to <1/4 of mile   ? Diagnostic tests X-rays: negative for acute abnormality, does show mild degenerative changes;   ? Patient Stated Goals Get rid of pain; obtain some  exercise to strengthen/stretch area;   ? Currently in Pain? Yes   ? Pain Score 3    ? Pain Location Hip   ? Pain Orientation Right;Medial   ? Pain Descriptors / Indicators Aching   ? Pain Type Chronic pain   ? Pain Onset More than a month ago   ? Pain Frequency Intermittent   ? ?  ?  ? ?  ? ? ? ? ? ?Trigger Point Dry Needling (TDN), unbilled ?Education performed with patient regarding potential benefit of TDN. Reviewed precautions and risks with patient. Reviewed special precautions/risks over lung fields which include pneumothorax. Reviewed signs and symptoms of pneumothorax and advised pt to go to ER immediately if these symptoms develop advise them of dry needling treatment. Extensive time spent with pt to ensure full understanding of TDN risks. Pt provided verbal consent to treatment. TDN performed to  with 0.3 x 60 single needle placements with local twitch response (LTR). Pistoning technique utilized. Improved pain-free motion following intervention. Adductor musculature of R hip noted x3 minutes ? ? ? ?Treatment ?Rolling stick to adductor and R gastroc x 9 minutes (educated on use at home) ?Adductor lengthening stretch in hooklying 2x 60 seconds RLE ?Hamstring lengthening stretch 60 seconds RLE ?Figure 4 stretch 60 seconds  ? ?7Lb dumbbell lateral weight shift lunge for adductor stretch 10x each side ?R adductor lifts 10x in sidelying position  ?GTB adduction 10x ? ?FOTO: 62% ? ?Pt educated throughout session about proper posture and technique with exercises. Improved exercise technique, movement at target joints, use of target muscles after min to mod verbal, visual, tactile cues. ? ? ?Patient educated on TDN for education and implementation in future sessions if beneficial for patient. Patient educated on use of roller at home in addition to gentle strengthening and stretching. Continued lengthening of adductor muscle in combination with hip stabilization will benefit patient. Patient would benefit from  skilled PT intervention to alleviate tightness in right hip adductors and reduce pain.  ? ? ? ? ? ? ? ? ? ? ? ? ? ? ? ? ? ? ? PT Education - 07/10/21 1352   ? ? Education Details exercise technique, body mechanics   ? Person(s) Educated Patient   ? Methods Explanation;Demonstration;Tactile cues;Verbal cues   ? Comprehension Verbalized understanding;Returned demonstration;Verbal cues required;Tactile cues required   ? ?  ?  ? ?  ? ? ? PT Short Term Goals - 07/03/21 1537   ? ?  ? PT SHORT TERM GOAL #1  ? Title Patient will be adherent to HEP at least 3x a week to improve functional strength and balance for better safety at home.   ? Time 4   ? Period Weeks   ?  Status New   ? Target Date 07/31/21   ? ?  ?  ? ?  ? ? ? ? PT Long Term Goals - 07/10/21 1532   ? ?  ? PT LONG TERM GOAL #1  ? Title Patient will increase BLE gross strength to 4+/5 as to improve functional strength for independent gait, increased standing tolerance and increased ADL ability.   ? Time 6   ? Period Weeks   ? Status New   ? Target Date 08/14/21   ?  ? PT LONG TERM GOAL #2  ? Title Patient will report a worst pain of 3/10 on VAS in    right hip/groin         to improve tolerance with ADLs and reduced symptoms with activities.   ? Time 6   ? Period Weeks   ? Status New   ? Target Date 08/14/21   ?  ? PT LONG TERM GOAL #3  ? Title Patient will improve FOTO score by 5% to indicate improved functional mobility/ADLs.   ? Baseline 4/12: 62%   ? Time 6   ? Period Weeks   ? Status New   ? Target Date 08/14/21   ?  ? PT LONG TERM GOAL #4  ? Title Patient will increase six minute walk test distance to >1400 feet without hip/groin pain  for progression to community ambulator and improve gait ability   ? Time 6   ? Period Weeks   ? Status New   ? Target Date 08/14/21   ? ?  ?  ? ?  ? ? ? ? ? ? ? ? Plan - 07/10/21 1533   ? ? Clinical Impression Statement Patient educated on TDN for education and implementation in future sessions if beneficial for patient.  Patient educated on use of roller at home in addition to gentle strengthening and stretching. Continued lengthening of adductor muscle in combination with hip stabilization will benefit patient. Patient would benefit

## 2021-07-12 ENCOUNTER — Ambulatory Visit: Payer: 59

## 2021-07-12 DIAGNOSIS — R262 Difficulty in walking, not elsewhere classified: Secondary | ICD-10-CM

## 2021-07-12 DIAGNOSIS — M25551 Pain in right hip: Secondary | ICD-10-CM | POA: Diagnosis not present

## 2021-07-12 DIAGNOSIS — M6281 Muscle weakness (generalized): Secondary | ICD-10-CM | POA: Diagnosis not present

## 2021-07-12 NOTE — Therapy (Signed)
[place] [facility] ?[place], [place], [address] ?Phone: [phone]   Fax:  [phone] ? ?Physical Therapy Treatment ? ?Patient Details  ?Name: Kathleen Yu ?MRN: [id number] ?Date of Birth: Aug 01, 1955 ?Referring Provider (PT): Mack Guise, MD ? ? ?Encounter Date: 07/12/2021 ? ? PT End of Session - 07/12/21 1019   ? ? Visit Number 3   ? Number of Visits 13   ? Date for PT Re-Evaluation 08/14/21   ? Authorization Type UMR   ? PT Start Time [phone]   ? PT Stop Time 1015   ? PT Time Calculation (min) 46 min   ? Activity Tolerance Patient tolerated treatment well;No increased pain   ? Behavior During Therapy [facility] for tasks assessed/performed   ? ?  ?  ? ?  ? ? ?Past Medical History:  ?Diagnosis Date  ? Abnormal liver function tests   ? Allergy   ? Anemia   ? H/O  ? Arthritis   ? RA  ? Depression   ? Diabetes mellitus without complication ([place])   ? Fatty liver   ? Hypercholesterolemia   ? Hypertension   ? Migraine headache   ? better since menopause  ? Neuromuscular disorder ([place])   ? Recurrent sinusitis   ? allergies  ? Tuberculosis 1980'S  ? + TB TEST-WAS TREATED  ? ? ?Past Surgical History:  ?Procedure Laterality Date  ? BILATERAL CARPAL TUNNEL RELEASE    ? BREAST BIOPSY Left 05/12/2017  ? FRAGMENTS WITH FEATURES OF AN EARLY INTRADUCTAL PAPILLOMA WITH aprocine changes  ? BREAST BIOPSY Left 06/12/2017  ? Procedure: BREAST BIOPSY;  Surgeon: Robert Bellow, MD;  Location: [facility];  Service: General;  Laterality: Left;  ? BREAST DUCTAL SYSTEM EXCISION Left 06/12/2017  ? Procedure: EXCISION DUCTAL SYSTEM BREAST;  Surgeon: Robert Bellow, MD;  Location: [facility];  Service: General;  Laterality: Left;  ? BREAST EXCISIONAL BIOPSY Left 2004  ? x 2  ? COLONOSCOPY WITH PROPOFOL N/A 02/11/2016  ? Procedure: COLONOSCOPY WITH PROPOFOL;  Surgeon: Lucilla Lame, MD;  Location: [place];  Service: Endoscopy;  Laterality: N/A;  ? COLONOSCOPY WITH PROPOFOL N/A  02/18/2021  ? Procedure: COLONOSCOPY WITH PROPOFOL;  Surgeon: Lucilla Lame, MD;  Location: [place];  Service: Endoscopy;  Laterality: N/A;  ? EXCISION MORTON'S NEUROMA Left 06/01/2015  ? Procedure: EXCISION MORTON'S NEUROMA;  Surgeon: Sharlotte Alamo, DPM;  Location: [facility];  Service: Podiatry;  Laterality: Left;  Local w/ mac  ? FRACTURE SURGERY  2017  ? OSTECTOMY Left 06/01/2015  ? Procedure: OSTECTOMY/ part. excision fracture fragment left 4th toe;  Surgeon: Sharlotte Alamo, DPM;  Location: [facility];  Service: Podiatry;  Laterality: Left;  ? POLYPECTOMY N/A 02/11/2016  ? Procedure: POLYPECTOMY;  Surgeon: Lucilla Lame, MD;  Location: [place];  Service: Endoscopy;  Laterality: N/A;  ? RHINOPLASTY    ? TUBAL LIGATION  1997  ? ? ?There were no vitals filed for this visit. ? ? Subjective Assessment - 07/12/21 0931   ? ? Subjective Pt reports benefit from TDN last session. Reporting 2/10 pain in R adductor group distally. Reports completion of HEP regularly.   ? Pertinent History 66 yo Female reports right groin pain. She went to see orthopedic MD and was diagnosed with hip strain. She was given meloxicam and she reports that it is a lot better. She reports pain has been going on for 4-5 months. She reports in Jan 2023 her  pain was so severe that she was limping almost every day. She reports with a lot of walking her hips will bother her more. She denies any numbness/tingling; Denies any falls. Patient does work in Recovery;   ? Limitations Walking   ? How long can you sit comfortably? NA- will have some discomfort if stretching right leg out to side   ? How long can you stand comfortably? 30 min, may be limited with soreness in back/fatigue;   ? How long can you walk comfortably? could walk a mile, would get tired- when feeling good; however when feeling more pain walking is limited to <1/4 of mile   ? Diagnostic tests X-rays: negative for acute abnormality, does show mild degenerative changes;   ? Patient  Stated Goals Get rid of pain; obtain some exercise to strengthen/stretch area;   ? Currently in Pain? Yes   ? Pain Score 2    ? Pain Orientation Right;Medial   ? Pain Descriptors / Indicators Aching   ? Pain Onset More than a month ago   ? Pain Frequency Intermittent   ? ?  ?  ? ?  ? ? ? ?Manual Therapy:  ? ?Pt in hook lying with IASTM performed with roller to RLE gastroc and adductors for pain modulation and tissue extensibility. 12 minutes ? ? ? ? ?There.ex: ? ?7# dumbbell alternating lateral weight shift lunge for adductor stretch 10x each side ? ?Figure 4 stretch 3x60 seconds on RLE ? ?R adductor lifts in side lying position: x10 no resistance. 2x10, 2.5# AW, noted decreased eccentric control and motor control with weight.  ? ?Hook lying R hamstring stretch added hip abduction for hip adduction stretch with PT assist: 3x30 sec ? ?Lateral monster walks with GTB: 2x20'/direction R and L ? ?Alternating LE lunges: SUE support on // bars, 1x10/LE. Mod multimodal cues for form/technique with good carryover after cues.  ? ? ? PT Education - 07/12/21 0933   ? ? Education Details form/technique with exercise.   ? Person(s) Educated Patient   ? Methods Explanation;Demonstration;Tactile cues   ? Comprehension Verbalized understanding;Returned demonstration   ? ?  ?  ? ?  ? ? ? PT Short Term Goals - 07/03/21 1537   ? ?  ? PT SHORT TERM GOAL #1  ? Title Patient will be adherent to HEP at least 3x a week to improve functional strength and balance for better safety at home.   ? Time 4   ? Period Weeks   ? Status New   ? Target Date 07/31/21   ? ?  ?  ? ?  ? ? ? ? PT Long Term Goals - 07/10/21 1532   ? ?  ? PT LONG TERM GOAL #1  ? Title Patient will increase BLE gross strength to 4+/5 as to improve functional strength for independent gait, increased standing tolerance and increased ADL ability.   ? Time 6   ? Period Weeks   ? Status New   ? Target Date 08/14/21   ?  ? PT LONG TERM GOAL #2  ? Title Patient will report a worst  pain of 3/10 on VAS in    right hip/groin         to improve tolerance with ADLs and reduced symptoms with activities.   ? Time 6   ? Period Weeks   ? Status New   ? Target Date 08/14/21   ?  ? PT LONG TERM GOAL #3  ? Title  Patient will improve FOTO score by 5% to indicate improved functional mobility/ADLs.   ? Baseline 4/12: 62%   ? Time 6   ? Period Weeks   ? Status New   ? Target Date 08/14/21   ?  ? PT LONG TERM GOAL #4  ? Title Patient will increase six minute walk test distance to >1400 feet without hip/groin pain  for progression to community ambulator and improve gait ability   ? Time 6   ? Period Weeks   ? Status New   ? Target Date 08/14/21   ? ?  ?  ? ?  ? ? ? ? ? ? ? ? Plan - 07/12/21 1020   ? ? Clinical Impression Statement Continuing PT POC with use of manual intervention and therapeutic exercises. Pt tolerating progression of volume of resistnace training without exacerbation of symptoms with pt actually reporting improvement in pain post session. Min VC's for form/technique with exercise throughout. Pt will continue to benefit from PT to progress strength and mobility to return to PLOF.   ? Personal Factors and Comorbidities Comorbidity 3+;Fitness;Time since onset of injury/illness/exacerbation   ? Comorbidities obesity, HTN, diabetes (controlled), arthritis, depression;   ? Examination-Activity Limitations Locomotion Level;Stand   ? Examination-Participation Restrictions Community Activity;Valla Leaver Work   ? Stability/Clinical Decision Making Stable/Uncomplicated   ? Rehab Potential Good   ? PT Frequency 2x / week   ? PT Duration 6 weeks   ? PT Treatment/Interventions Cryotherapy;Electrical Stimulation;Moist Heat;Ultrasound;Therapeutic activities;Therapeutic exercise;Patient/family education;Manual techniques;Passive range of motion;Dry needling;Energy conservation   ? PT Next Visit Plan dry needling RLE hip adductors, progress resistance exercise to pt tolerance   ? PT Home Exercise Plan initiated   ?  Consulted and Agree with Plan of Care Patient   ? ?  ?  ? ?  ? ? ?Patient will benefit from skilled therapeutic intervention in order to improve the following deficits and impairments:  Difficulty walking,

## 2021-07-15 ENCOUNTER — Ambulatory Visit: Payer: 59 | Admitting: Physical Therapy

## 2021-07-15 ENCOUNTER — Telehealth: Payer: 59

## 2021-07-15 ENCOUNTER — Other Ambulatory Visit: Payer: Self-pay

## 2021-07-15 DIAGNOSIS — R262 Difficulty in walking, not elsewhere classified: Secondary | ICD-10-CM | POA: Diagnosis not present

## 2021-07-15 DIAGNOSIS — M6281 Muscle weakness (generalized): Secondary | ICD-10-CM | POA: Diagnosis not present

## 2021-07-15 DIAGNOSIS — M25551 Pain in right hip: Secondary | ICD-10-CM

## 2021-07-15 NOTE — Therapy (Signed)
[place] ?[facility] ?[place], [place], [address] ?Phone: [phone]   Fax:  [phone] ? ?Physical Therapy Treatment ? ?Patient Details  ?Name: Kathleen Yu ?MRN: [id number] ?Date of Birth: 11/07/1955 ?Referring Provider (PT): Mack Guise, MD ? ? ?Encounter Date: 07/15/2021 ? ? PT End of Session - 07/15/21 1057   ? ? Visit Number 4   ? Number of Visits 13   ? Date for PT Re-Evaluation 08/14/21   ? Authorization Type UMR   ? PT Start Time 1015   ? PT Stop Time 1055   ? PT Time Calculation (min) 40 min   ? Activity Tolerance Patient tolerated treatment well;No increased pain   ? Behavior During Therapy [facility] for tasks assessed/performed   ? ?  ?  ? ?  ? ? ?Past Medical History:  ?Diagnosis Date  ? Abnormal liver function tests   ? Allergy   ? Anemia   ? H/O  ? Arthritis   ? RA  ? Depression   ? Diabetes mellitus without complication ([place])   ? Fatty liver   ? Hypercholesterolemia   ? Hypertension   ? Migraine headache   ? better since menopause  ? Neuromuscular disorder ([place])   ? Recurrent sinusitis   ? allergies  ? Tuberculosis 1980'S  ? + TB TEST-WAS TREATED  ? ? ?Past Surgical History:  ?Procedure Laterality Date  ? BILATERAL CARPAL TUNNEL RELEASE    ? BREAST BIOPSY Left 05/12/2017  ? FRAGMENTS WITH FEATURES OF AN EARLY INTRADUCTAL PAPILLOMA WITH aprocine changes  ? BREAST BIOPSY Left 06/12/2017  ? Procedure: BREAST BIOPSY;  Surgeon: Robert Bellow, MD;  Location: [facility];  Service: General;  Laterality: Left;  ? BREAST DUCTAL SYSTEM EXCISION Left 06/12/2017  ? Procedure: EXCISION DUCTAL SYSTEM BREAST;  Surgeon: Robert Bellow, MD;  Location: [facility];  Service: General;  Laterality: Left;  ? BREAST EXCISIONAL BIOPSY Left 2004  ? x 2  ? COLONOSCOPY WITH PROPOFOL N/A 02/11/2016  ? Procedure: COLONOSCOPY WITH PROPOFOL;  Surgeon: Lucilla Lame, MD;  Location: [place];  Service: Endoscopy;  Laterality: N/A;  ? COLONOSCOPY WITH PROPOFOL N/A  02/18/2021  ? Procedure: COLONOSCOPY WITH PROPOFOL;  Surgeon: Lucilla Lame, MD;  Location: [place];  Service: Endoscopy;  Laterality: N/A;  ? EXCISION MORTON'S NEUROMA Left 06/01/2015  ? Procedure: EXCISION MORTON'S NEUROMA;  Surgeon: Sharlotte Alamo, DPM;  Location: [facility];  Service: Podiatry;  Laterality: Left;  Local w/ mac  ? FRACTURE SURGERY  2017  ? OSTECTOMY Left 06/01/2015  ? Procedure: OSTECTOMY/ part. excision fracture fragment left 4th toe;  Surgeon: Sharlotte Alamo, DPM;  Location: [facility];  Service: Podiatry;  Laterality: Left;  ? POLYPECTOMY N/A 02/11/2016  ? Procedure: POLYPECTOMY;  Surgeon: Lucilla Lame, MD;  Location: [place];  Service: Endoscopy;  Laterality: N/A;  ? RHINOPLASTY    ? TUBAL LIGATION  1997  ? ? ?There were no vitals filed for this visit. ? ? Subjective Assessment - 07/15/21 1045   ? ? Subjective Pt reports min pain this session. Bareley noticeable when coming in this session. Pt reports completing yard work and weed pulling over the weekend with no adverse s/s following.   ? Pertinent History 66 yo Female reports right groin pain. She went to see orthopedic MD and was diagnosed with hip strain. She was given meloxicam and she reports that it is a lot better. She reports pain has been going on  for 4-5 months. She reports in Jan 2023 her pain was so severe that she was limping almost every day. She reports with a lot of walking her hips will bother her more. She denies any numbness/tingling; Denies any falls. Patient does work in Recovery;   ? Limitations Walking   ? How long can you sit comfortably? NA- will have some discomfort if stretching right leg out to side   ? How long can you stand comfortably? 30 min, may be limited with soreness in back/fatigue;   ? How long can you walk comfortably? could walk a mile, would get tired- when feeling good; however when feeling more pain walking is limited to <1/4 of mile   ? Diagnostic tests X-rays: negative for acute abnormality,  does show mild degenerative changes;   ? Patient Stated Goals Get rid of pain; obtain some exercise to strengthen/stretch area;   ? Pain Onset More than a month ago   ? ?  ?  ? ?  ? ? ? ? ? ?Manual Therapy:  ? ?Piriformis stretch 4 x 30 sec on each LE alternating with hamstring stretch and slight abduction to further isolate abductor musculature Stretch 4 x 30 sec total.  ?- increased tightness noted on involved side on the R ? ? ? ?There.ex: ? ?7# dumbbell alternating lateral weight shift lunge for adductor stretch 10x each side ? ?R adductor lifts in side lying position: . 2x10, 2.5# AW,  ?Hook lying R hamstring stretch added hip abduction for hip adduction stretch with PT assist: 3x30 sec ? ?Lateral monster walks with GTB: 2x20'/direction R and L ? ?Alternating LE lunges: SUE support on // bars, 1x10/LE. Min cues for proper form.   ? ?Trigger Point Dry Needling (TDN), unbilled, performed by Janna Arch, PT, DPT ? ?Education performed with patient regarding potential benefit of TDN. Reviewed precautions and risks with patient. Reviewed special precautions/risks over lung fields which include pneumothorax. Reviewed signs and symptoms of pneumothorax and advised pt to go to ER immediately if these symptoms develop advise them of dry needling treatment. Extensive time spent with pt to ensure full understanding of TDN risks. Pt provided verbal consent to treatment. TDN performed to  with 0.3 x 60 single needle placements with local twitch response (LTR). Pistoning technique utilized. Improved pain-free motion following intervention. Adductor musculature of R hip noted x 5 minutes ? ? ? ? ? ? ? ? ? ? ? ? ? ? ? ? ? ? ? ? ? ? ? ? PT Education - 07/15/21 1057   ? ? Education Details exercise form and technique   ? Person(s) Educated Patient   ? Methods Explanation   ? Comprehension Verbalized understanding   ? ?  ?  ? ?  ? ? ? PT Short Term Goals - 07/03/21 1537   ? ?  ? PT SHORT TERM GOAL #1  ? Title Patient will be  adherent to HEP at least 3x a week to improve functional strength and balance for better safety at home.   ? Time 4   ? Period Weeks   ? Status New   ? Target Date 07/31/21   ? ?  ?  ? ?  ? ? ? ? PT Long Term Goals - 07/10/21 1532   ? ?  ? PT LONG TERM GOAL #1  ? Title Patient will increase BLE gross strength to 4+/5 as to improve functional strength for independent gait, increased standing tolerance and increased ADL ability.   ?  Time 6   ? Period Weeks   ? Status New   ? Target Date 08/14/21   ?  ? PT LONG TERM GOAL #2  ? Title Patient will report a worst pain of 3/10 on VAS in    right hip/groin         to improve tolerance with ADLs and reduced symptoms with activities.   ? Time 6   ? Period Weeks   ? Status New   ? Target Date 08/14/21   ?  ? PT LONG TERM GOAL #3  ? Title Patient will improve FOTO score by 5% to indicate improved functional mobility/ADLs.   ? Baseline 4/12: 62%   ? Time 6   ? Period Weeks   ? Status New   ? Target Date 08/14/21   ?  ? PT LONG TERM GOAL #4  ? Title Patient will increase six minute walk test distance to >1400 feet without hip/groin pain  for progression to community ambulator and improve gait ability   ? Time 6   ? Period Weeks   ? Status New   ? Target Date 08/14/21   ? ?  ?  ? ?  ? ? ? ? ? ? ? ? Plan - 07/15/21 1057   ? ? Clinical Impression Statement Continuing PT POC with use of manual intervention and therapeutic exercises. Pt tolerated TPDN well this session without c/o pain and less sensitivity with trigger points this session. Min VC's for form/technique with exercise throughout. Pt will continue to benefit from PT to progress strength and mobility to return to PLOF.   ? Personal Factors and Comorbidities Comorbidity 3+;Fitness;Time since onset of injury/illness/exacerbation   ? Comorbidities obesity, HTN, diabetes (controlled), arthritis, depression;   ? Examination-Activity Limitations Locomotion Level;Stand   ? Examination-Participation Restrictions Community  Activity;Valla Leaver Work   ? Stability/Clinical Decision Making Stable/Uncomplicated   ? Rehab Potential Good   ? PT Frequency 2x / week   ? PT Duration 6 weeks   ? PT Treatment/Interventions Cryotherapy;Electrical Stimu

## 2021-07-17 ENCOUNTER — Ambulatory Visit: Payer: 59 | Admitting: Physical Therapy

## 2021-07-17 DIAGNOSIS — M6281 Muscle weakness (generalized): Secondary | ICD-10-CM | POA: Diagnosis not present

## 2021-07-17 DIAGNOSIS — M25551 Pain in right hip: Secondary | ICD-10-CM | POA: Diagnosis not present

## 2021-07-17 DIAGNOSIS — R262 Difficulty in walking, not elsewhere classified: Secondary | ICD-10-CM

## 2021-07-18 ENCOUNTER — Encounter: Payer: Self-pay | Admitting: Physical Therapy

## 2021-07-18 NOTE — Therapy (Signed)
[place] [facility] ?[place], [place], [address] ?Phone: [phone]   Fax:  [phone] ? ?Physical Therapy Treatment ? ?Patient Details  ?Name: Kathleen Yu ?MRN: [id number] ?Date of Birth: 1955-12-28 ?Referring Provider (PT): Mack Guise, MD ? ? ?Encounter Date: 07/17/2021 ? ? PT End of Session - 07/18/21 0947   ? ? Visit Number 5   ? Number of Visits 13   ? Date for PT Re-Evaluation 08/14/21   ? Authorization Type UMR   ? PT Start Time 1102   ? PT Stop Time 5056   ? PT Time Calculation (min) 41 min   ? Activity Tolerance Patient tolerated treatment well;No increased pain   ? Behavior During Therapy [facility] for tasks assessed/performed   ? ?  ?  ? ?  ? ? ?Past Medical History:  ?Diagnosis Date  ? Abnormal liver function tests   ? Allergy   ? Anemia   ? H/O  ? Arthritis   ? RA  ? Depression   ? Diabetes mellitus without complication ([place])   ? Fatty liver   ? Hypercholesterolemia   ? Hypertension   ? Migraine headache   ? better since menopause  ? Neuromuscular disorder ([place])   ? Recurrent sinusitis   ? allergies  ? Tuberculosis 1980'S  ? + TB TEST-WAS TREATED  ? ? ?Past Surgical History:  ?Procedure Laterality Date  ? BILATERAL CARPAL TUNNEL RELEASE    ? BREAST BIOPSY Left 05/12/2017  ? FRAGMENTS WITH FEATURES OF AN EARLY INTRADUCTAL PAPILLOMA WITH aprocine changes  ? BREAST BIOPSY Left 06/12/2017  ? Procedure: BREAST BIOPSY;  Surgeon: Robert Bellow, MD;  Location: [facility];  Service: General;  Laterality: Left;  ? BREAST DUCTAL SYSTEM EXCISION Left 06/12/2017  ? Procedure: EXCISION DUCTAL SYSTEM BREAST;  Surgeon: Robert Bellow, MD;  Location: [facility];  Service: General;  Laterality: Left;  ? BREAST EXCISIONAL BIOPSY Left 2004  ? x 2  ? COLONOSCOPY WITH PROPOFOL N/A 02/11/2016  ? Procedure: COLONOSCOPY WITH PROPOFOL;  Surgeon: Lucilla Lame, MD;  Location: [place];  Service: Endoscopy;  Laterality: N/A;  ? COLONOSCOPY WITH PROPOFOL N/A  02/18/2021  ? Procedure: COLONOSCOPY WITH PROPOFOL;  Surgeon: Lucilla Lame, MD;  Location: [place];  Service: Endoscopy;  Laterality: N/A;  ? EXCISION MORTON'S NEUROMA Left 06/01/2015  ? Procedure: EXCISION MORTON'S NEUROMA;  Surgeon: Sharlotte Alamo, DPM;  Location: [facility];  Service: Podiatry;  Laterality: Left;  Local w/ mac  ? FRACTURE SURGERY  2017  ? OSTECTOMY Left 06/01/2015  ? Procedure: OSTECTOMY/ part. excision fracture fragment left 4th toe;  Surgeon: Sharlotte Alamo, DPM;  Location: [facility];  Service: Podiatry;  Laterality: Left;  ? POLYPECTOMY N/A 02/11/2016  ? Procedure: POLYPECTOMY;  Surgeon: Lucilla Lame, MD;  Location: [place];  Service: Endoscopy;  Laterality: N/A;  ? RHINOPLASTY    ? TUBAL LIGATION  1997  ? ? ?There were no vitals filed for this visit. ? ? Subjective Assessment - 07/18/21 0945   ? ? Subjective Patient reports doing well. She denies any right medial thigh discomfort and reports she has been able to cross her leg without discomfort. She reports new onset right anterior/lateral hip discomfort which she attribtues to some of the exercise.   ? Pertinent History 66 yo Female reports right groin pain. She went to see orthopedic MD and was diagnosed with hip strain. She was given meloxicam and she reports that it is  a lot better. She reports pain has been going on for 4-5 months. She reports in Jan 2023 her pain was so severe that she was limping almost every day. She reports with a lot of walking her hips will bother her more. She denies any numbness/tingling; Denies any falls. Patient does work in Recovery;   ? Limitations Walking   ? How long can you sit comfortably? NA- will have some discomfort if stretching right leg out to side   ? How long can you stand comfortably? 30 min, may be limited with soreness in back/fatigue;   ? How long can you walk comfortably? could walk a mile, would get tired- when feeling good; however when feeling more pain walking is limited to <1/4  of mile   ? Diagnostic tests X-rays: negative for acute abnormality, does show mild degenerative changes;   ? Patient Stated Goals Get rid of pain; obtain some exercise to strengthen/stretch area;   ? Currently in Pain? Yes   ? Pain Score 2    ? Pain Location Hip   ? Pain Orientation Right   ? Pain Descriptors / Indicators Aching   ? Pain Type Chronic pain   ? Pain Onset More than a month ago   ? Pain Frequency Intermittent   ? Aggravating Factors  tender to touch/certain movement   ? Pain Relieving Factors exercise/stretch   ? Multiple Pain Sites No   ? ?  ?  ? ?  ? ? ? ? ? ? ? ?Patient hooklying ?PT performed SLR hamstring stretch 20 sec hold ? With ankle DF/PF x10 reps for neural stretch ?Passive knee to chest stretch 20 sec hold x2 reps ?PT performed passive circles with clicking noted but no pain x5 reps ? ?RLE hip abduction/adduction (knee fall outs) x5 reps with no discomfort; ? ?RLE SLR hip flexion 2# x10 reps with cues for proper positioning, instructed patient to have LLE knee bent to support lumbar spine for better positioning; -min difficulty;  ?Suspended RLE hamstring curl 2# x10 reps, reports burning in groin, no popping in hip, mild popping in knee;  ? ? ?Sidelying: ?RLE hip abduction 2# x5 reps ?RLE hip abduction SLR with circles clockwise/counterclockwise 2# x5 reps each, reports catch when going forward(counterclockwise) ?RLE passive quad stretch 20 sec hold x2 reps with moderate pull felt in thigh;  ? ?Patient prone: ?PT performed grade II-III PA Mobs to right femoral joint 10 sec bouts x2 sets, no discomfort ?PT performed passive RLE quad stretch 20 sec hold, only felt in soft tissue;  ?RLE gluteal max hip extension with knee flexed x10 reps, minimal difficulty ? ?Right sidelying: ?RLE Hip adduction 2x10 reps with good control noted;  ? ?Patient hooklying: ?Instructed patient in piriformis stretch: ? Modified 30 sec hold ? Progressed to regular with therapist assistance lifting LE x30  sec ? ?Patient stood and walked around gym, no pain reported in hip ? ?Standing with green tband around BLE  ?Monster walks x15 feet forward/backward x1 set each direction ? ?Patient tolerated session well with no pain in hip at end of session;  ? ? ? ? ? ? ? ? ? ? ? ? ? ? ? ? ? ? ? ? ? PT Education - 07/18/21 0946   ? ? Education Details exercise form/technique;   ? Person(s) Educated Patient   ? Methods Explanation;Verbal cues   ? Comprehension Verbalized understanding;Returned demonstration;Verbal cues required;Need further instruction   ? ?  ?  ? ?  ? ? ?  PT Short Term Goals - 07/03/21 1537   ? ?  ? PT SHORT TERM GOAL #1  ? Title Patient will be adherent to HEP at least 3x a week to improve functional strength and balance for better safety at home.   ? Time 4   ? Period Weeks   ? Status New   ? Target Date 07/31/21   ? ?  ?  ? ?  ? ? ? ? PT Long Term Goals - 07/10/21 1532   ? ?  ? PT LONG TERM GOAL #1  ? Title Patient will increase BLE gross strength to 4+/5 as to improve functional strength for independent gait, increased standing tolerance and increased ADL ability.   ? Time 6   ? Period Weeks   ? Status New   ? Target Date 08/14/21   ?  ? PT LONG TERM GOAL #2  ? Title Patient will report a worst pain of 3/10 on VAS in    right hip/groin         to improve tolerance with ADLs and reduced symptoms with activities.   ? Time 6   ? Period Weeks   ? Status New   ? Target Date 08/14/21   ?  ? PT LONG TERM GOAL #3  ? Title Patient will improve FOTO score by 5% to indicate improved functional mobility/ADLs.   ? Baseline 4/12: 62%   ? Time 6   ? Period Weeks   ? Status New   ? Target Date 08/14/21   ?  ? PT LONG TERM GOAL #4  ? Title Patient will increase six minute walk test distance to >1400 feet without hip/groin pain  for progression to community ambulator and improve gait ability   ? Time 6   ? Period Weeks   ? Status New   ? Target Date 08/14/21   ? ?  ?  ? ?  ? ? ? ? ? ? ? ? Plan - 07/18/21 0947   ? ? Clinical  Impression Statement Patient motivated and participated well within session. She was instructed RLE hip strengthening focusing on stabilization exercise. She tolerated well. She does report mild difficulty with advan

## 2021-07-23 ENCOUNTER — Ambulatory Visit: Payer: 59

## 2021-07-23 ENCOUNTER — Other Ambulatory Visit: Payer: Self-pay

## 2021-07-23 DIAGNOSIS — M25551 Pain in right hip: Secondary | ICD-10-CM | POA: Diagnosis not present

## 2021-07-23 DIAGNOSIS — M6281 Muscle weakness (generalized): Secondary | ICD-10-CM | POA: Diagnosis not present

## 2021-07-23 DIAGNOSIS — R262 Difficulty in walking, not elsewhere classified: Secondary | ICD-10-CM | POA: Diagnosis not present

## 2021-07-23 NOTE — Therapy (Signed)
[place] [facility] ?[place], [place], [address] ?Phone: [phone]   Fax:  [phone] ? ?Physical Therapy Treatment ? ?Patient Details  ?Name: Kathleen Yu ?MRN: [id number] ?Date of Birth: 10-Feb-1956 ?Referring Provider (PT): Mack Guise, MD ? ? ?Encounter Date: 07/23/2021 ? ? PT End of Session - 07/23/21 1014   ? ? Visit Number 6   ? Number of Visits 13   ? Date for PT Re-Evaluation 08/14/21   ? Authorization Type UMR   ? PT Start Time [phone]   ? PT Stop Time 1014   ? PT Time Calculation (min) 41 min   ? Activity Tolerance Patient tolerated treatment well;No increased pain   ? Behavior During Therapy [facility] for tasks assessed/performed   ? ?  ?  ? ?  ? ? ?Past Medical History:  ?Diagnosis Date  ? Abnormal liver function tests   ? Allergy   ? Anemia   ? H/O  ? Arthritis   ? RA  ? Depression   ? Diabetes mellitus without complication ([place])   ? Fatty liver   ? Hypercholesterolemia   ? Hypertension   ? Migraine headache   ? better since menopause  ? Neuromuscular disorder ([place])   ? Recurrent sinusitis   ? allergies  ? Tuberculosis 1980'S  ? + TB TEST-WAS TREATED  ? ? ?Past Surgical History:  ?Procedure Laterality Date  ? BILATERAL CARPAL TUNNEL RELEASE    ? BREAST BIOPSY Left 05/12/2017  ? FRAGMENTS WITH FEATURES OF AN EARLY INTRADUCTAL PAPILLOMA WITH aprocine changes  ? BREAST BIOPSY Left 06/12/2017  ? Procedure: BREAST BIOPSY;  Surgeon: Robert Bellow, MD;  Location: [facility];  Service: General;  Laterality: Left;  ? BREAST DUCTAL SYSTEM EXCISION Left 06/12/2017  ? Procedure: EXCISION DUCTAL SYSTEM BREAST;  Surgeon: Robert Bellow, MD;  Location: [facility];  Service: General;  Laterality: Left;  ? BREAST EXCISIONAL BIOPSY Left 2004  ? x 2  ? COLONOSCOPY WITH PROPOFOL N/A 02/11/2016  ? Procedure: COLONOSCOPY WITH PROPOFOL;  Surgeon: Lucilla Lame, MD;  Location: [place];  Service: Endoscopy;  Laterality: N/A;  ? COLONOSCOPY WITH PROPOFOL N/A  02/18/2021  ? Procedure: COLONOSCOPY WITH PROPOFOL;  Surgeon: Lucilla Lame, MD;  Location: [place];  Service: Endoscopy;  Laterality: N/A;  ? EXCISION MORTON'S NEUROMA Left 06/01/2015  ? Procedure: EXCISION MORTON'S NEUROMA;  Surgeon: Sharlotte Alamo, DPM;  Location: [facility];  Service: Podiatry;  Laterality: Left;  Local w/ mac  ? FRACTURE SURGERY  2017  ? OSTECTOMY Left 06/01/2015  ? Procedure: OSTECTOMY/ part. excision fracture fragment left 4th toe;  Surgeon: Sharlotte Alamo, DPM;  Location: [facility];  Service: Podiatry;  Laterality: Left;  ? POLYPECTOMY N/A 02/11/2016  ? Procedure: POLYPECTOMY;  Surgeon: Lucilla Lame, MD;  Location: [place];  Service: Endoscopy;  Laterality: N/A;  ? RHINOPLASTY    ? TUBAL LIGATION  1997  ? ? ?There were no vitals filed for this visit. ? ? ? Subjective Assessment - 07/23/21 0937   ? ? Subjective Pt reports spasm on Saturday. She reports pain reached 7/10. She reports current pain level of 2/10.   ? Pertinent History 66 yo Female reports right groin pain. She went to see orthopedic MD and was diagnosed with hip strain. She was given meloxicam and she reports that it is a lot better. She reports pain has been going on for 4-5 months. She reports in Jan 2023 her pain was  so severe that she was limping almost every day. She reports with a lot of walking her hips will bother her more. She denies any numbness/tingling; Denies any falls. Patient does work in Recovery;   ? Limitations Walking   ? How long can you sit comfortably? NA- will have some discomfort if stretching right leg out to side   ? How long can you stand comfortably? 30 min, may be limited with soreness in back/fatigue;   ? How long can you walk comfortably? could walk a mile, would get tired- when feeling good; however when feeling more pain walking is limited to <1/4 of mile   ? Diagnostic tests X-rays: negative for acute abnormality, does show mild degenerative changes;   ? Patient Stated Goals Get rid of  pain; obtain some exercise to strengthen/stretch area;   ? Currently in Pain? Yes   ? Pain Score 2    ? Pain Location Groin   ? Pain Orientation Right   ? Pain Onset More than a month ago   ? ?  ?  ? ?  ? ? ?Therex ? ?Nustep level 2 x 4 min. Cuing for SPM 65. Pt rates easy. Instructed pt through set-up, SPM, technique.  ?  ?Patient hooklying ?PT performed SLR hamstring stretch 30 sec hold x 2 sets B ?            With ankle DF/PF x10 reps for neural stretch with last set on R ?Passive knee to chest stretch 30 sec hold x2 reps B ? ?Piriformis stretch 2x30 sec B. Pt reports this improves symptoms. ? ?   ?BLE SLR hip flexion 2# x10, 12x, 5x Pt rates as medium. ? ?Glute bridge 10x, 15x. Pt rates medium. ? ?Pball adductor squeezes with red ball with 1-2 sec hold x 10 reps x 2 sets. ? ?2# weights donned side-lying hip abduction 1x10 followed by 1x12 B. Rates medium. ? ?Standing hip hikes 15 x B. ? ?Pt educated throughout session about proper posture and technique with exercises. Improved exercise technique, movement at target joints, use of target muscles after min to mod verbal, visual, tactile cues. ? ? ?  ? PT Short Term Goals - 07/03/21 1537   ? ?  ? PT SHORT TERM GOAL #1  ? Title Patient will be adherent to HEP at least 3x a week to improve functional strength and balance for better safety at home.   ? Time 4   ? Period Weeks   ? Status New   ? Target Date 07/31/21   ? ?  ?  ? ?  ? ? ? ? PT Long Term Goals - 07/10/21 1532   ? ?  ? PT LONG TERM GOAL #1  ? Title Patient will increase BLE gross strength to 4+/5 as to improve functional strength for independent gait, increased standing tolerance and increased ADL ability.   ? Time 6   ? Period Weeks   ? Status New   ? Target Date 08/14/21   ?  ? PT LONG TERM GOAL #2  ? Title Patient will report a worst pain of 3/10 on VAS in    right hip/groin         to improve tolerance with ADLs and reduced symptoms with activities.   ? Time 6   ? Period Weeks   ? Status New   ? Target  Date 08/14/21   ?  ? PT LONG TERM GOAL #3  ? Title Patient will improve  FOTO score by 5% to indicate improved functional mobility/ADLs.   ? Baseline 4/12: 62%   ? Time 6   ? Period Weeks   ? Status New   ? Target Date 08/14/21   ?  ? PT LONG TERM GOAL #4  ? Title Patient will increase six minute walk test distance to >1400 feet without hip/groin pain  for progression to community ambulator and improve gait ability   ? Time 6   ? Period Weeks   ? Status New   ? Target Date 08/14/21   ? ?  ?  ? ?  ? ? ? ? ? ? ? ? Plan - 07/23/21 1013   ? ? Clinical Impression Statement Pt tolerates interventions well without increased pain but with reports of fatigue, particularly with adductor squeezes. Pt able to tolerate overall increase in reps, indicating improved strength. The pt will benefit from further skilled PT to decrease pain and increase QOL.   ? Personal Factors and Comorbidities Comorbidity 3+;Fitness;Time since onset of injury/illness/exacerbation   ? Comorbidities obesity, HTN, diabetes (controlled), arthritis, depression;   ? Examination-Activity Limitations Locomotion Level;Stand   ? Examination-Participation Restrictions Community Activity;[employer] Work   ? Stability/Clinical Decision Making Stable/Uncomplicated   ? Rehab Potential Good   ? PT Frequency 2x / week   ? PT Duration 6 weeks   ? PT Treatment/Interventions Cryotherapy;Electrical Stimulation;Moist Heat;Ultrasound;Therapeutic activities;Therapeutic exercise;Patient/family education;Manual techniques;Passive range of motion;Dry needling;Energy conservation   ? PT Next Visit Plan dry needling RLE hip adductors, progress resistance exercise to pt tolerance   ? PT Home Exercise Plan initiated; no updates   ? Consulted and Agree with Plan of Care Patient   ? ?  ?  ? ?  ? ? ?Patient will benefit from skilled therapeutic intervention in order to improve the following deficits and impairments:  Difficulty walking, Increased muscle spasms, Pain, Decreased activity  tolerance, Decreased strength ? ?Visit Diagnosis: ?Pain in right hip ? ?Muscle weakness (generalized) ? ? ? ? ?Problem List ?Patient Active Problem List  ? Diagnosis Date Noted  ? Right groin pain 05/06/2021

## 2021-07-25 ENCOUNTER — Ambulatory Visit: Payer: 59

## 2021-07-25 DIAGNOSIS — R262 Difficulty in walking, not elsewhere classified: Secondary | ICD-10-CM | POA: Diagnosis not present

## 2021-07-25 DIAGNOSIS — M25551 Pain in right hip: Secondary | ICD-10-CM | POA: Diagnosis not present

## 2021-07-25 DIAGNOSIS — M6281 Muscle weakness (generalized): Secondary | ICD-10-CM | POA: Diagnosis not present

## 2021-07-25 NOTE — Therapy (Signed)
[facility] ?[place], [place], [address] ?Phone: [phone]   Fax:  [phone] ? ?Physical Therapy Treatment ? ?Patient Details  ?Name: Kathleen Yu ?MRN: [id number] ?Date of Birth: 1955-05-12 ?Referring Provider (PT): Mack Guise, MD ? ? ?Encounter Date: 07/25/2021 ? ? PT End of Session - 07/25/21 0954   ? ? Visit Number 7   ? Number of Visits 13   ? Date for PT Re-Evaluation 08/14/21   ? Authorization Type UMR   ? PT Start Time 6283   ? PT Stop Time 1015   ? PT Time Calculation (min) 43 min   ? Activity Tolerance Patient tolerated treatment well;No increased pain   ? Behavior During Therapy [facility] for tasks assessed/performed   ? ?  ?  ? ?  ? ? ?Past Medical History:  ?Diagnosis Date  ? Abnormal liver function tests   ? Allergy   ? Anemia   ? H/O  ? Arthritis   ? RA  ? Depression   ? Diabetes mellitus without complication ([place])   ? Fatty liver   ? Hypercholesterolemia   ? Hypertension   ? Migraine headache   ? better since menopause  ? Neuromuscular disorder ([place])   ? Recurrent sinusitis   ? allergies  ? Tuberculosis 1980'S  ? + TB TEST-WAS TREATED  ? ? ?Past Surgical History:  ?Procedure Laterality Date  ? BILATERAL CARPAL TUNNEL RELEASE    ? BREAST BIOPSY Left 05/12/2017  ? FRAGMENTS WITH FEATURES OF AN EARLY INTRADUCTAL PAPILLOMA WITH aprocine changes  ? BREAST BIOPSY Left 06/12/2017  ? Procedure: BREAST BIOPSY;  Surgeon: Robert Bellow, MD;  Location: [facility];  Service: General;  Laterality: Left;  ? BREAST DUCTAL SYSTEM EXCISION Left 06/12/2017  ? Procedure: EXCISION DUCTAL SYSTEM BREAST;  Surgeon: Robert Bellow, MD;  Location: [facility];  Service: General;  Laterality: Left;  ? BREAST EXCISIONAL BIOPSY Left 2004  ? x 2  ? COLONOSCOPY WITH PROPOFOL N/A 02/11/2016  ? Procedure: COLONOSCOPY WITH PROPOFOL;  Surgeon: Lucilla Lame, MD;  Location: [facility];  Service: Endoscopy;  Laterality: N/A;  ? COLONOSCOPY WITH PROPOFOL N/A  02/18/2021  ? Procedure: COLONOSCOPY WITH PROPOFOL;  Surgeon: Lucilla Lame, MD;  Location: [facility];  Service: Endoscopy;  Laterality: N/A;  ? EXCISION MORTON'S NEUROMA Left 06/01/2015  ? Procedure: EXCISION MORTON'S NEUROMA;  Surgeon: Sharlotte Alamo, DPM;  Location: [facility];  Service: Podiatry;  Laterality: Left;  Local w/ mac  ? FRACTURE SURGERY  2017  ? OSTECTOMY Left 06/01/2015  ? Procedure: OSTECTOMY/ part. excision fracture fragment left 4th toe;  Surgeon: Sharlotte Alamo, DPM;  Location: [facility];  Service: Podiatry;  Laterality: Left;  ? POLYPECTOMY N/A 02/11/2016  ? Procedure: POLYPECTOMY;  Surgeon: Lucilla Lame, MD;  Location: [facility];  Service: Endoscopy;  Laterality: N/A;  ? RHINOPLASTY    ? TUBAL LIGATION  1997  ? ? ?There were no vitals filed for this visit. ? ? Subjective Assessment - 07/25/21 0937   ? ? Subjective Pt reports no spasms since this past weekend. Reports pain 1/10 NPS.   ? Currently in Pain? Yes   ? Pain Score 1    ? Pain Location Groin   ? Pain Orientation Right   ? Pain Descriptors / Indicators Aching   ? ?  ?  ? ?  ? ? ? Therex ?  ?Octane level 5 x 5 min for warm up ?  ?  Patient hook lying ?PT performed SLR hamstring stretch 30 sec hold x 2 sets B ?            With ankle DF/PF x10 reps with each SLR on BLE's ?  ?Piriformis stretch 2x 1 minute bilat ?   ?BLE SLR hip flexion 4#, 2x10 ? ?Glute bridge: x12. 2x8 with blue 3000 gram med ball ? ?Resisted GTB side steps: 4 x 12' ? ?Alternating lunges onto bosu: 2x8/LE, light RUE support for balance ? ?  ? ? PT Education - 07/25/21 0954   ? ? Education Details form/technique with exercise   ? Person(s) Educated Patient   ? Methods Explanation;Demonstration;Tactile cues;Verbal cues   ? Comprehension Verbalized understanding;Returned demonstration   ? ?  ?  ? ?  ? ? ? PT Short Term Goals - 07/03/21 1537   ? ?  ? PT SHORT TERM GOAL #1  ? Title Patient will be adherent to HEP at least 3x a week to improve functional strength and balance  for better safety at home.   ? Time 4   ? Period Weeks   ? Status New   ? Target Date 07/31/21   ? ?  ?  ? ?  ? ? ? ? PT Long Term Goals - 07/10/21 1532   ? ?  ? PT LONG TERM GOAL #1  ? Title Patient will increase BLE gross strength to 4+/5 as to improve functional strength for independent gait, increased standing tolerance and increased ADL ability.   ? Time 6   ? Period Weeks   ? Status New   ? Target Date 08/14/21   ?  ? PT LONG TERM GOAL #2  ? Title Patient will report a worst pain of 3/10 on VAS in    right hip/groin         to improve tolerance with ADLs and reduced symptoms with activities.   ? Time 6   ? Period Weeks   ? Status New   ? Target Date 08/14/21   ?  ? PT LONG TERM GOAL #3  ? Title Patient will improve FOTO score by 5% to indicate improved functional mobility/ADLs.   ? Baseline 4/12: 62%   ? Time 6   ? Period Weeks   ? Status New   ? Target Date 08/14/21   ?  ? PT LONG TERM GOAL #4  ? Title Patient will increase six minute walk test distance to >1400 feet without hip/groin pain  for progression to community ambulator and improve gait ability   ? Time 6   ? Period Weeks   ? Status New   ? Target Date 08/14/21   ? ?  ?  ? ?  ? ? ? ? ? ? ? ? Plan - 07/25/21 0954   ? ? Clinical Impression Statement Pt tolerating progression of resistance in hook lying and standing without increase in pain levels. Pt demonstrating excellent understanding of exercises with very minimal need from PT for instruction. Pt will continue to benefit from skilled PT services to further decrease pain with work and at home tasks to return to [facility].   ? Personal Factors and Comorbidities Comorbidity 3+;Fitness;Time since onset of injury/illness/exacerbation   ? Comorbidities obesity, HTN, diabetes (controlled), arthritis, depression;   ? Examination-Activity Limitations Locomotion Level;Stand   ? Examination-Participation Restrictions Community Activity;[employer] Work   ? Stability/Clinical Decision Making Stable/Uncomplicated   ? Rehab  Potential Good   ? PT Frequency 2x / week   ?  PT Duration 6 weeks   ? PT Treatment/Interventions Cryotherapy;Electrical Stimulation;Moist Heat;Ultrasound;Therapeutic activities;Therapeutic exercise;Patient/family education;Manual techniques;Passive range of motion;Dry needling;Energy conservation   ? PT Next Visit Plan dry needling RLE hip adductors, progress resistance exercise to pt tolerance   ? PT Home Exercise Plan initiated; no updates   ? Consulted and Agree with Plan of Care Patient   ? ?  ?  ? ?  ? ? ?Patient will benefit from skilled therapeutic intervention in order to improve the following deficits and impairments:  Difficulty walking, Increased muscle spasms, Pain, Decreased activity tolerance, Decreased strength ? ?Visit Diagnosis: ?Pain in right hip ? ?Muscle weakness (generalized) ? ?Difficulty in walking, not elsewhere classified ? ? ? ? ?Problem List ?Patient Active Problem List  ? Diagnosis Date Noted  ? Right groin pain 05/06/2021  ? Tick bite 08/19/2020  ? Stress 11/21/2019  ? Leg cramp 07/11/2019  ? Immunization counseling 03/08/2019  ? Type 2 diabetes mellitus with hyperglycemia ([place]) 11/28/2018  ? Pain in left hip 10/11/2018  ? Abnormal mammogram 02/22/2018  ? Mass of upper outer quadrant of left breast 06/04/2017  ? 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 History of colonic polyps 02/13/2016  ? Special screening for malignant neoplasms, colon   ? Benign neoplasm of ascending colon   ? Rectal polyp   ? Abdominal pain 12/01/2015  ? Diabetes mellitus without complication ([place]) 22/44/9753  ? Health care maintenance 05/04/2014  ? Adult BMI 37.0-37.9 kg/sq m 05/04/2014  ? Foot pain 11/22/2013  ? Post-menopausal bleeding 07/13/2012  ? Hypertension 03/18/2012  ? Hypercholesterolemia 03/18/2012  ? Abnormal liver function tests 03/18/2012  ? Migraine headache 03/18/2012  ? ? ?Salem Caster. Fairly IV, PT, DPT ?Physical Therapist- [place]  ?[facility]  ?07/25/2021, 10:12 AM ? ?[facility] ?[place] [facility] ?[place], [place], [address] ?Phone: [phone]   Fax:  [phone] ? ?Name: Kathleen Yu ?MRN: [id number] ?Date of Birth:

## 2021-07-29 ENCOUNTER — Ambulatory Visit: Payer: 59 | Attending: Orthopedic Surgery

## 2021-07-29 DIAGNOSIS — R262 Difficulty in walking, not elsewhere classified: Secondary | ICD-10-CM | POA: Diagnosis not present

## 2021-07-29 DIAGNOSIS — M25551 Pain in right hip: Secondary | ICD-10-CM | POA: Insufficient documentation

## 2021-07-29 DIAGNOSIS — M6281 Muscle weakness (generalized): Secondary | ICD-10-CM | POA: Diagnosis not present

## 2021-07-29 NOTE — Therapy (Signed)
[facility] SERVICES [address] [place], [place], [address] Phone: [phone]   Fax:  [phone]  Physical Therapy Treatment/Discharge   Patient Details  Name: Kathleen Yu MRN: [id number] Date of Birth: 11-22-1955 Referring Provider (PT): Martha Clan, MD   Encounter Date: 07/29/2021   PT End of Session - 07/29/21 1034     Visit Number 8    Number of Visits 13    Date for PT Re-Evaluation 08/14/21    Authorization Type UMR    PT Start Time 1019    PT Stop Time 1045    PT Time Calculation (min) 26 min    Activity Tolerance Patient tolerated treatment well;No increased pain    Behavior During Therapy WFL for tasks assessed/performed             Past Medical History:  Diagnosis Date   Abnormal liver function tests    Allergy    Anemia    H/O   Arthritis    RA   Depression    Diabetes mellitus without complication ([facility])    Fatty liver    Hypercholesterolemia    Hypertension    Migraine headache    better since menopause   Neuromuscular disorder ([facility])    Recurrent sinusitis    allergies   Tuberculosis 1980'S   + TB TEST-WAS TREATED    Past Surgical History:  Procedure Laterality Date   BILATERAL CARPAL TUNNEL RELEASE     BREAST BIOPSY Left 05/12/2017   FRAGMENTS WITH FEATURES OF AN EARLY INTRADUCTAL PAPILLOMA WITH aprocine changes   BREAST BIOPSY Left 06/12/2017   Procedure: BREAST BIOPSY;  Surgeon: Earline Mayotte, MD;  Location: [facility];  Service: General;  Laterality: Left;   BREAST DUCTAL SYSTEM EXCISION Left 06/12/2017   Procedure: EXCISION DUCTAL SYSTEM BREAST;  Surgeon: Earline Mayotte, MD;  Location: [facility];  Service: General;  Laterality: Left;   BREAST EXCISIONAL BIOPSY Left 2004   x 2   COLONOSCOPY WITH PROPOFOL N/A 02/11/2016   Procedure: COLONOSCOPY WITH PROPOFOL;  Surgeon: Midge Minium, MD;  Location: [facility];  Service: Endoscopy;  Laterality: N/A;   COLONOSCOPY WITH  PROPOFOL N/A 02/18/2021   Procedure: COLONOSCOPY WITH PROPOFOL;  Surgeon: Midge Minium, MD;  Location: [facility];  Service: Endoscopy;  Laterality: N/A;   EXCISION MORTON'S NEUROMA Left 06/01/2015   Procedure: EXCISION MORTON'S NEUROMA;  Surgeon: Linus Galas, DPM;  Location: [facility];  Service: Podiatry;  Laterality: Left;  Local w/ mac   FRACTURE SURGERY  2017   OSTECTOMY Left 06/01/2015   Procedure: OSTECTOMY/ part. excision fracture fragment left 4th toe;  Surgeon: Linus Galas, DPM;  Location: [facility];  Service: Podiatry;  Laterality: Left;   POLYPECTOMY N/A 02/11/2016   Procedure: POLYPECTOMY;  Surgeon: Midge Minium, MD;  Location: [facility];  Service: Endoscopy;  Laterality: N/A;   RHINOPLASTY     TUBAL LIGATION  1997    There were no vitals filed for this visit.   Subjective Assessment - 07/29/21 1042     Subjective Patient reports she has not had any more pain or flare ups. Is ready to be discharged.    Pertinent History 66 yo Female reports right groin pain. She went to see orthopedic MD and was diagnosed with hip strain. She was given meloxicam and she reports that it is a lot better. She reports pain has been going on for 4-5 months. She reports in Jan 2023 her pain was  so severe that she was limping almost every day. She reports with a lot of walking her hips will bother her more. She denies any numbness/tingling; Denies any falls. Patient does work in Recovery;    Limitations Walking    How long can you sit comfortably? NA- will have some discomfort if stretching right leg out to side    How long can you stand comfortably? 30 min, may be limited with soreness in back/fatigue;    How long can you walk comfortably? could walk a mile, would get tired- when feeling good; however when feeling more pain walking is limited to <1/4 of mile    Diagnostic tests X-rays: negative for acute abnormality, does show mild degenerative changes;    Patient Stated Goals Get rid of pain;  obtain some exercise to strengthen/stretch area;    Currently in Pain? No/denies                Physical therapy treatment session today consisted of completing assessment of goals and administration of testing as demonstrated in flow sheet. Addition treatments may be found below.       Nustep Lvl 3 Seat position 7 cues for PRM> 60  Goals:  BLE strength: 4+/5 VAS: 1.5/10  FOTO 83%  6 min walk test: 1402 ft     Patient has met all goals at this time. She is agreeable to discharge as pain has resolved and she is independent with stretching and strengthening. We will be happy to see this patient again in the future as needed.                  PT Education - 07/29/21 1034     Education Details discharge    Person(s) Educated Patient    Methods Explanation;Demonstration;Tactile cues;Verbal cues    Comprehension Verbalized understanding;Returned demonstration;Verbal cues required;Tactile cues required              PT Short Term Goals - 07/29/21 1022       PT SHORT TERM GOAL #1   Title Patient will be adherent to HEP at least 3x a week to improve functional strength and balance for better safety at home.    Baseline 5/1: compliant    Time 4    Period Weeks    Status Achieved    Target Date 07/31/21               PT Long Term Goals - 07/29/21 1045       PT LONG TERM GOAL #1   Title Patient will increase BLE gross strength to 4+/5 as to improve functional strength for independent gait, increased standing tolerance and increased ADL ability.    Baseline 5/1: 4+/5    Time 6    Period Weeks    Status Achieved    Target Date 08/14/21      PT LONG TERM GOAL #2   Title Patient will report a worst pain of 3/10 on VAS in    right hip/groin         to improve tolerance with ADLs and reduced symptoms with activities.    Baseline 5/1: 1.5/10 pain    Time 6    Period Weeks    Status Achieved    Target Date 08/14/21      PT LONG TERM GOAL #3    Title Patient will improve FOTO score by 5% to indicate improved functional mobility/ADLs.    Baseline 4/12: 62% 5/1: 83%    Time  6    Period Weeks    Status Achieved    Target Date 08/14/21      PT LONG TERM GOAL #4   Title Patient will increase six minute walk test distance to >1400 feet without hip/groin pain  for progression to community ambulator and improve gait ability    Baseline 5/1: 146ft    Time 6    Period Weeks    Status Achieved    Target Date 08/14/21                   Plan - 07/29/21 1051     Clinical Impression Statement Patient has met all goals at this time. She is agreeable to discharge as pain has resolved and she is independent with stretching and strengthening. We will be happy to see this patient again in the future as needed.    Personal Factors and Comorbidities Comorbidity 3+;Fitness;Time since onset of injury/illness/exacerbation    Comorbidities obesity, HTN, diabetes (controlled), arthritis, depression;    Examination-Activity Limitations Locomotion Level;Stand    Examination-Participation Restrictions Community Activity;Yard Work    Stability/Clinical Decision Making Stable/Uncomplicated    Rehab Potential Good    PT Frequency 2x / week    PT Duration 6 weeks    PT Treatment/Interventions Cryotherapy;Electrical Stimulation;Moist Heat;Ultrasound;Therapeutic activities;Therapeutic exercise;Patient/family education;Manual techniques;Passive range of motion;Dry needling;Energy conservation    PT Next Visit Plan dry needling RLE hip adductors, progress resistance exercise to pt tolerance    PT Home Exercise Plan initiated; no updates    Consulted and Agree with Plan of Care Patient             Patient will benefit from skilled therapeutic intervention in order to improve the following deficits and impairments:  Difficulty walking, Increased muscle spasms, Pain, Decreased activity tolerance, Decreased strength  Visit Diagnosis: Pain in  right hip  Muscle weakness (generalized)  Difficulty in walking, not elsewhere classified     Problem List Patient Active Problem List   Diagnosis Date Noted   Right groin pain 05/06/2021   Tick bite 08/19/2020   Stress 11/21/2019   Leg cramp 07/11/2019   Immunization counseling 03/08/2019   Type 2 diabetes mellitus with hyperglycemia ([facility]) 11/28/2018   Pain in left hip 10/11/2018   Abnormal mammogram 02/22/2018   Mass of upper outer quadrant of left breast 06/04/2017   Papilloma of left breast 06/04/2017   History of colonic polyps 02/13/2016   Special screening for malignant neoplasms, colon    Benign neoplasm of ascending colon    Rectal polyp    Abdominal pain 12/01/2015   Diabetes mellitus without complication ([facility]) 08/07/2014   Health care maintenance 05/04/2014   Adult BMI 37.0-37.9 kg/sq m 05/04/2014   Foot pain 11/22/2013   Post-menopausal bleeding 07/13/2012   Hypertension 03/18/2012   Hypercholesterolemia 03/18/2012   Abnormal liver function tests 03/18/2012   Migraine headache 03/18/2012   Precious Bard, PT, DPT  07/29/2021, 10:52 AM  [facility] MAIN [facility] SERVICES [address] [place], [place], [address] Phone: [phone]   Fax:  [phone]  Name: DARRELLE HENDRICH MRN: [id number] Date of Birth: 1956/02/18

## 2021-08-01 ENCOUNTER — Other Ambulatory Visit: Payer: Self-pay

## 2021-08-06 ENCOUNTER — Other Ambulatory Visit: Payer: Self-pay

## 2021-09-01 ENCOUNTER — Other Ambulatory Visit: Payer: Self-pay

## 2021-09-02 ENCOUNTER — Other Ambulatory Visit: Payer: Self-pay

## 2021-09-05 ENCOUNTER — Other Ambulatory Visit: Payer: Self-pay

## 2021-09-05 ENCOUNTER — Ambulatory Visit: Payer: 59 | Admitting: Internal Medicine

## 2021-09-05 ENCOUNTER — Encounter: Payer: Self-pay | Admitting: Internal Medicine

## 2021-09-05 VITALS — BP 120/76 | HR 85 | Temp 98.1°F | Resp 16 | Ht 62.0 in | Wt 192.2 lb

## 2021-09-05 DIAGNOSIS — R7989 Other specified abnormal findings of blood chemistry: Secondary | ICD-10-CM

## 2021-09-05 DIAGNOSIS — Z23 Encounter for immunization: Secondary | ICD-10-CM | POA: Diagnosis not present

## 2021-09-05 DIAGNOSIS — E1165 Type 2 diabetes mellitus with hyperglycemia: Secondary | ICD-10-CM

## 2021-09-05 DIAGNOSIS — E7800 Pure hypercholesterolemia, unspecified: Secondary | ICD-10-CM

## 2021-09-05 DIAGNOSIS — F439 Reaction to severe stress, unspecified: Secondary | ICD-10-CM

## 2021-09-05 DIAGNOSIS — Z860100 Personal history of colon polyps, unspecified: Secondary | ICD-10-CM

## 2021-09-05 DIAGNOSIS — I10 Essential (primary) hypertension: Secondary | ICD-10-CM | POA: Diagnosis not present

## 2021-09-05 DIAGNOSIS — E2839 Other primary ovarian failure: Secondary | ICD-10-CM

## 2021-09-05 LAB — BASIC METABOLIC PANEL
BUN: 16 mg/dL (ref 6–23)
CO2: 27 mEq/L (ref 19–32)
Calcium: 10.2 mg/dL (ref 8.4–10.5)
Chloride: 100 mEq/L (ref 96–112)
Creatinine, Ser: 0.83 mg/dL (ref 0.40–1.20)
GFR: 74.14 mL/min (ref >60.00)
Glucose, Bld: 104 mg/dL — ABNORMAL HIGH (ref 70–99)
Potassium: 4 mEq/L (ref 3.5–5.1)
Sodium: 136 mEq/L (ref 135–145)

## 2021-09-05 LAB — CBC WITH DIFFERENTIAL/PLATELET
Basophils Absolute: 0.1 10*3/uL (ref 0.0–0.1)
Basophils Relative: 0.7 % (ref 0.0–3.0)
Eosinophils Absolute: 0.4 10*3/uL (ref 0.0–0.7)
Eosinophils Relative: 5.7 % — ABNORMAL HIGH (ref 0.0–5.0)
HCT: 40.6 % (ref 36.0–46.0)
Hemoglobin: 13.8 g/dL (ref 12.0–15.0)
Lymphocytes Relative: 29.1 % (ref 12.0–46.0)
Lymphs Abs: 2 10*3/uL (ref 0.7–4.0)
MCHC: 33.9 g/dL (ref 30.0–36.0)
MCV: 85.8 fl (ref 78.0–100.0)
Monocytes Absolute: 0.5 10*3/uL (ref 0.1–1.0)
Monocytes Relative: 7.4 % (ref 3.0–12.0)
Neutro Abs: 3.9 10*3/uL (ref 1.4–7.7)
Neutrophils Relative %: 57.1 % (ref 43.0–77.0)
Platelets: 273 10*3/uL (ref 150.0–400.0)
RBC: 4.73 Mil/uL (ref 3.87–5.11)
RDW: 13.4 % (ref 11.5–15.5)
WBC: 6.9 10*3/uL (ref 4.0–10.5)

## 2021-09-05 LAB — HEPATIC FUNCTION PANEL
ALT: 73 U/L — ABNORMAL HIGH (ref 0–35)
AST: 44 U/L — ABNORMAL HIGH (ref 0–37)
Albumin: 4.7 g/dL (ref 3.5–5.2)
Alkaline Phosphatase: 51 U/L (ref 39–117)
Bilirubin, Direct: 0.1 mg/dL (ref 0.0–0.3)
Total Bilirubin: 0.8 mg/dL (ref 0.2–1.2)
Total Protein: 7.1 g/dL (ref 6.0–8.3)

## 2021-09-05 LAB — LIPID PANEL
Cholesterol: 133 mg/dL (ref 0–200)
HDL: 35.7 mg/dL — ABNORMAL LOW (ref >39.00)
LDL Cholesterol: 69 mg/dL (ref 0–99)
NonHDL: 97.35
Total CHOL/HDL Ratio: 4
Triglycerides: 140 mg/dL (ref 0.0–149.0)
VLDL: 28 mg/dL (ref 0.0–40.0)

## 2021-09-05 LAB — HEMOGLOBIN A1C: Hgb A1c MFr Bld: 6.3 % (ref 4.6–6.5)

## 2021-09-05 MED ORDER — SEMAGLUTIDE (1 MG/DOSE) 4 MG/3ML ~~LOC~~ SOPN
1.0000 mg | PEN_INJECTOR | SUBCUTANEOUS | 3 refills | Status: DC
  Filled 2021-09-05: qty 3, 28d supply, fill #0
  Filled 2021-10-08: qty 3, 28d supply, fill #1
  Filled 2021-10-30: qty 3, 28d supply, fill #2
  Filled 2021-12-03: qty 3, 28d supply, fill #3

## 2021-09-05 NOTE — Progress Notes (Unsigned)
[name], female   DOB: 08/13/1955, 66 y.o.   MRN: [id number]   Subjective:    [name], female    DOB: [birth year], 66 y.o.   MRN: [id number]   Patient here for a scheduled follow up.   Chief Complaint  Patient presents with   Follow-up   Hyperlipidemia   Hypertension   Diabetes   .   HPI Recently having issues with right hip pain.  Saw ortho/PT.  Is better.  Numbness/tingling 4th and 5th fingers - better.  Reports no weight loss with ozempic.  Tolerating.  No significant nausea.  Occasional queeziness, but no significant symptoms.  No vomiting.  No abdominal pain.  Bowels moving.  Discussed diet and exercise.  Discussed increasing dose of ozempic.  Some fatigue.  Has a new puppy.  Getting up at night with puppy.     Past Medical History:  Diagnosis Date   Abnormal liver function tests    Allergy    Anemia    H/O   Arthritis    RA   Depression    Diabetes mellitus without complication ([place])    Fatty liver    Hypercholesterolemia    Hypertension    Migraine headache    better since menopause   Neuromuscular disorder (HCC)    Recurrent sinusitis    allergies   Tuberculosis 1980'S   + TB TEST-WAS TREATED   Past Surgical History:  Procedure Laterality Date   BILATERAL CARPAL TUNNEL RELEASE     BREAST BIOPSY Left 05/12/2017   FRAGMENTS WITH FEATURES OF AN EARLY INTRADUCTAL PAPILLOMA WITH aprocine changes   BREAST BIOPSY Left 06/12/2017   Procedure: BREAST BIOPSY;  Surgeon: Robert Bellow, MD;  Location: [facility];  Service: General;  Laterality: Left;   BREAST DUCTAL SYSTEM EXCISION Left 06/12/2017   Procedure: EXCISION DUCTAL SYSTEM BREAST;  Surgeon: Robert Bellow, MD;  Location: [facility];  Service: General;  Laterality: Left;   BREAST EXCISIONAL BIOPSY Left 2004   x 2   COLONOSCOPY WITH PROPOFOL N/A 02/11/2016   Procedure: COLONOSCOPY WITH PROPOFOL;  Surgeon: Lucilla Lame, MD;  Location: [facility];  Service: Endoscopy;   Laterality: N/A;   COLONOSCOPY WITH PROPOFOL N/A 02/18/2021   Procedure: COLONOSCOPY WITH PROPOFOL;  Surgeon: Lucilla Lame, MD;  Location: [facility];  Service: Endoscopy;  Laterality: N/A;   EXCISION MORTON'S NEUROMA Left 06/01/2015   Procedure: EXCISION MORTON'S NEUROMA;  Surgeon: Sharlotte Alamo, DPM;  Location: [facility];  Service: Podiatry;  Laterality: Left;  Local w/ mac   FRACTURE SURGERY  2017   OSTECTOMY Left 06/01/2015   Procedure: OSTECTOMY/ part. excision fracture fragment left 4th toe;  Surgeon: Sharlotte Alamo, DPM;  Location: [facility];  Service: Podiatry;  Laterality: Left;   POLYPECTOMY N/A 02/11/2016   Procedure: POLYPECTOMY;  Surgeon: Lucilla Lame, MD;  Location: [facility];  Service: Endoscopy;  Laterality: N/A;   RHINOPLASTY     TUBAL LIGATION  1997   Family History  Problem Relation Age of Onset   Heart disease Father        died age 74 (MI)   Heart disease Other        4 uncles - CABG   Colon cancer Other        great aunt   Heart disease Paternal Grandmother        first MI - 43s   Migraines Mother    Breast cancer Mother 28  Hypertension Mother    Hypertension Maternal Grandmother    Early death Maternal Grandmother    Diabetes Paternal Grandfather    Stroke Paternal Grandfather    Depression Maternal Aunt    Breast cancer Other        1 mat and 1 pat  gr aunt   Social History   Socioeconomic History   Marital status: Married    Spouse name: Not on file   Number of children: 2   Years of education: Not on file   Highest education level: Not on file  Occupational History   Not on file  Tobacco Use   Smoking status: Never   Smokeless tobacco: Never  Vaping Use   Vaping Use: Never used  Substance and Sexual Activity   Alcohol use: Yes    Alcohol/week: 1.0 standard drink of alcohol    Types: 1 Cans of beer per week    Comment: occasional   Drug use: No   Sexual activity: Yes    Birth control/protection: Post-menopausal, Surgical  Other  Topics Concern   Not on file  Social History Narrative   Not on file   Social Determinants of Health   Financial Resource Strain: Not on file  Food Insecurity: Not on file  Transportation Needs: Not on file  Physical Activity: Not on file  Stress: Not on file  Social Connections: Not on file     Review of Systems  Constitutional:  Positive for fatigue. Negative for appetite change and unexpected weight change.  HENT:  Negative for congestion and sinus pressure.   Respiratory:  Negative for cough, chest tightness and shortness of breath.   Cardiovascular:  Negative for chest pain, palpitations and leg swelling.  Gastrointestinal:  Negative for abdominal pain, diarrhea, nausea and vomiting.  Genitourinary:  Negative for difficulty urinating and dysuria.  Musculoskeletal:  Negative for joint swelling and myalgias.  Skin:  Negative for color change and rash.  Neurological:  Negative for dizziness, light-headedness and headaches.  Psychiatric/Behavioral:  Negative for agitation and dysphoric mood.        Objective:     BP 120/76 (BP Location: Left Arm, Patient Position: Sitting, Cuff Size: Large)   Pulse 85   Temp 98.1 F (36.7 C) (Temporal)   Resp 16   Ht _0  (1.575 m)   Wt 192 lb 3.2 oz (87.2 kg)   LMP 05/30/2007   SpO2 96%   BMI 35.15 kg/m  Wt Readings from Last 3 Encounters:  09/05/21 192 lb 3.2 oz (87.2 kg)  05/06/21 196 lb (88.9 kg)  02/18/21 194 lb (88 kg)    Physical Exam Vitals reviewed.  Constitutional:      General: She is not in acute distress.    Appearance: Normal appearance.  HENT:     Head: Normocephalic and atraumatic.     Right Ear: External ear normal.     Left Ear: External ear normal.  Eyes:     General: No scleral icterus.       Right eye: No discharge.        Left eye: No discharge.     Conjunctiva/sclera: Conjunctivae normal.  Neck:     Thyroid: No thyromegaly.  Cardiovascular:     Rate and Rhythm: Normal rate and regular rhythm.   Pulmonary:     Effort: No respiratory distress.     Breath sounds: Normal breath sounds. No wheezing.  Abdominal:     General: Bowel sounds are normal.  Palpations: Abdomen is soft.     Tenderness: There is no abdominal tenderness.  Musculoskeletal:        General: No swelling or tenderness.     Cervical back: Neck supple. No tenderness.  Lymphadenopathy:     Cervical: No cervical adenopathy.  Skin:    Findings: No erythema or rash.  Neurological:     Mental Status: She is alert.  Psychiatric:        Mood and Affect: Mood normal.        Behavior: Behavior normal.      Outpatient Encounter Medications as of 09/05/2021  Medication Sig   acetaminophen (TYLENOL) 500 MG tablet Take 1,000 mg by mouth daily as needed for moderate pain.    amLODipine (NORVASC) 10 MG tablet TAKE 1 TABLET BY MOUTH DAILY.   fexofenadine-pseudoephedrine (ALLEGRA-D) 60-120 MG 12 hr tablet Take 1 tablet by mouth daily as needed (allergies).    hydrochlorothiazide (HYDRODIURIL) 25 MG tablet Take 1 tablet (25 mg total) by mouth daily.   ibuprofen (ADVIL,MOTRIN) 200 MG tablet Take 600-800 mg by mouth daily as needed (arthritis).    lisinopril (ZESTRIL) 40 MG tablet TAKE 1 TABLET BY MOUTH DAILY.   loratadine (CLARITIN) 10 MG tablet Take 10 mg by mouth daily.   magnesium oxide (MAG-OX) 400 MG tablet Take 1 tablet (400 mg total) by mouth daily. (Patient taking differently: Take 400 mg by mouth 2 (two) times daily.)   metFORMIN (GLUCOPHAGE-XR) 500 MG 24 hr tablet Take 1 tablet (500 mg total) by mouth 2 (two) times daily.   naproxen sodium (ANAPROX) 220 MG tablet Take 440 mg by mouth daily as needed (pain).    PARoxetine (PAXIL) 10 MG tablet Take 1 tablet (10 mg total) by mouth every morning.   rosuvastatin (CRESTOR) 10 MG tablet Take 1 tablet (10 mg total) by mouth daily.   Semaglutide, 1 MG/DOSE, 4 MG/3ML SOPN Inject 1 mg as directed once a week.   [DISCONTINUED] Semaglutide,0.25 or 0.5MG/DOS, (OZEMPIC, 0.25 OR  0.5 MG/DOSE,) 2 MG/1.5ML SOPN Inject 0.5 mg into the skin once a week.   [DISCONTINUED] meloxicam (MOBIC) 7.5 MG tablet Take 1 tablet twice a day by oral route. (Patient not taking: Reported on 09/05/2021)   No facility-administered encounter medications on file as of 09/05/2021.     Lab Results  Component Value Date   WBC 6.9 09/05/2021   HGB 13.8 09/05/2021   HCT 40.6 09/05/2021   PLT 273.0 09/05/2021   GLUCOSE 104 (H) 09/05/2021   CHOL 133 09/05/2021   TRIG 140.0 09/05/2021   HDL 35.70 (L) 09/05/2021   LDLDIRECT 130.0 07/11/2019   LDLCALC 69 09/05/2021   ALT 73 (H) 09/05/2021   AST 44 (H) 09/05/2021   NA 136 09/05/2021   K 4.0 09/05/2021   CL 100 09/05/2021   CREATININE 0.83 09/05/2021   BUN 16 09/05/2021   CO2 27 09/05/2021   TSH 2.04 05/06/2021   HGBA1C 6.3 09/05/2021   MICROALBUR 2.0 (H) 05/06/2021    MM 3D SCREEN BREAST BILATERAL  Result Date: 05/23/2021 CLINICAL DATA:  Screening. EXAM: DIGITAL SCREENING BILATERAL MAMMOGRAM WITH TOMOSYNTHESIS AND CAD TECHNIQUE: Bilateral screening digital craniocaudal and mediolateral oblique mammograms were obtained. Bilateral screening digital breast tomosynthesis was performed. The images were evaluated with computer-aided detection. COMPARISON:  Previous exam(s). ACR Breast Density Category c: The breast tissue is heterogeneously dense, which may obscure small masses. FINDINGS: There are no findings suspicious for malignancy. IMPRESSION: No mammographic evidence of malignancy. A result letter of  this screening mammogram will be mailed directly to the patient. RECOMMENDATION: Screening mammogram in one year. (Code:SM-B-01Y) BI-RADS CATEGORY  1: Negative. Electronically Signed   By: Abelardo Diesel M.D.   On: 05/23/2021 10:31      Assessment & Plan:   Problem List Items Addressed This Visit     Abnormal liver function tests    Previous ultrasound revealed fatty liver.  Low carb diet, exercise and weight loss.  Follow liver function tests.        Estrogen deficiency    Discussed bone density.  Agreeable.  Schedule.       Relevant Orders   DG Bone Density   History of colonic polyps    Just had colonoscopy 01/2021 - Dr Allen Norris - diverticulosis and non bleeding internal hemorrhoids.  Recommended f/u colonoscopy in 7 years.        Hypercholesterolemia    Low carb diet and exercise.  On crestor.  Follow lipid panel and liver function tests.        Relevant Orders   Lipid Profile (Completed)   Hepatic function panel (Completed)   CBC with Differential/Platelet (Completed)   Hypertension    Continue hctz, amlodipine and lisinopril.  Follow pressures.  Follow metabolic panel.       Relevant Orders   Basic Metabolic Panel (BMET) (Completed)   Stress    Overall appears to be handling things relatively well.  Follow.       Type 2 diabetes mellitus with hyperglycemia ([place]) - Primary    On metformin and ozempic.  Will increase ozempic to 3m q week.  Low carb diet and exercise.  Follow met b and a1c.       Relevant Medications   Semaglutide, 1 MG/DOSE, 4 MG/3ML SOPN   Other Relevant Orders   HgB A1c (Completed)   Other Visit Diagnoses     Need for prophylactic vaccination against Streptococcus pneumoniae (pneumococcus)       Relevant Orders   Pneumococcal conjugate vaccine 20-valent (Prevnar 20) (Completed)        CEinar Pheasant MD

## 2021-09-08 ENCOUNTER — Encounter: Payer: Self-pay | Admitting: Internal Medicine

## 2021-09-08 ENCOUNTER — Telehealth: Payer: Self-pay | Admitting: Internal Medicine

## 2021-09-08 DIAGNOSIS — E2839 Other primary ovarian failure: Secondary | ICD-10-CM | POA: Insufficient documentation

## 2021-09-08 NOTE — Telephone Encounter (Signed)
Opened in error

## 2021-09-08 NOTE — Assessment & Plan Note (Signed)
Just had colonoscopy 01/2021 - Dr Wohl - diverticulosis and non bleeding internal hemorrhoids.  Recommended f/u colonoscopy in 7 years.   

## 2021-09-08 NOTE — Assessment & Plan Note (Signed)
Previous ultrasound revealed fatty liver.  Low carb diet, exercise and weight loss.  Follow liver function tests.

## 2021-09-08 NOTE — Assessment & Plan Note (Signed)
Low carb diet and exercise.  On crestor.  Follow lipid panel and liver function tests.

## 2021-09-08 NOTE — Assessment & Plan Note (Signed)
Discussed bone density.  Agreeable.  Schedule.

## 2021-09-08 NOTE — Assessment & Plan Note (Signed)
Overall appears to be handling things relatively well.  Follow.   

## 2021-09-08 NOTE — Assessment & Plan Note (Signed)
Continue hctz, amlodipine and lisinopril.  Follow pressures.  Follow metabolic panel.  

## 2021-09-08 NOTE — Assessment & Plan Note (Signed)
On metformin and ozempic.  Will increase ozempic to 26m q week.  Low carb diet and exercise.  Follow met b and a1c.

## 2021-09-10 ENCOUNTER — Telehealth: Payer: Self-pay

## 2021-09-10 DIAGNOSIS — R7989 Other specified abnormal findings of blood chemistry: Secondary | ICD-10-CM

## 2021-09-10 NOTE — Telephone Encounter (Signed)
Future lab ordered

## 2021-09-24 ENCOUNTER — Other Ambulatory Visit (INDEPENDENT_AMBULATORY_CARE_PROVIDER_SITE_OTHER): Payer: 59

## 2021-09-24 ENCOUNTER — Encounter: Payer: Self-pay | Admitting: Internal Medicine

## 2021-09-24 DIAGNOSIS — R7989 Other specified abnormal findings of blood chemistry: Secondary | ICD-10-CM

## 2021-09-24 LAB — HEPATIC FUNCTION PANEL
ALT: 49 U/L — ABNORMAL HIGH (ref 0–35)
AST: 27 U/L (ref 0–37)
Albumin: 4.7 g/dL (ref 3.5–5.2)
Alkaline Phosphatase: 52 U/L (ref 39–117)
Bilirubin, Direct: 0.1 mg/dL (ref 0.0–0.3)
Total Bilirubin: 0.5 mg/dL (ref 0.2–1.2)
Total Protein: 7.1 g/dL (ref 6.0–8.3)

## 2021-10-06 ENCOUNTER — Other Ambulatory Visit: Payer: Self-pay

## 2021-10-07 ENCOUNTER — Other Ambulatory Visit: Payer: Self-pay

## 2021-10-08 ENCOUNTER — Other Ambulatory Visit: Payer: Self-pay

## 2021-10-10 ENCOUNTER — Other Ambulatory Visit: Payer: Self-pay

## 2021-10-30 ENCOUNTER — Other Ambulatory Visit: Payer: Self-pay

## 2021-10-30 ENCOUNTER — Other Ambulatory Visit: Payer: Self-pay | Admitting: Internal Medicine

## 2021-10-30 MED ORDER — HYDROCHLOROTHIAZIDE 25 MG PO TABS
25.0000 mg | ORAL_TABLET | Freq: Every day | ORAL | 1 refills | Status: DC
  Filled 2021-10-30: qty 90, 90d supply, fill #0
  Filled 2022-01-29: qty 90, 90d supply, fill #1

## 2021-10-30 MED ORDER — PAROXETINE HCL 10 MG PO TABS
10.0000 mg | ORAL_TABLET | Freq: Every morning | ORAL | 1 refills | Status: DC
  Filled 2021-10-30: qty 90, 90d supply, fill #0
  Filled 2021-11-28 – 2022-01-29 (×2): qty 90, 90d supply, fill #1

## 2021-10-30 MED ORDER — LISINOPRIL 40 MG PO TABS
ORAL_TABLET | Freq: Every day | ORAL | 1 refills | Status: DC
  Filled 2021-10-30: qty 90, 90d supply, fill #0
  Filled 2022-02-21: qty 90, 90d supply, fill #1

## 2021-10-30 MED ORDER — AMLODIPINE BESYLATE 10 MG PO TABS
ORAL_TABLET | Freq: Every day | ORAL | 1 refills | Status: DC
  Filled 2021-10-30: qty 90, 90d supply, fill #0
  Filled 2022-02-12: qty 90, 90d supply, fill #1

## 2021-10-30 MED ORDER — ROSUVASTATIN CALCIUM 10 MG PO TABS
10.0000 mg | ORAL_TABLET | Freq: Every day | ORAL | 1 refills | Status: DC
  Filled 2021-10-30: qty 90, 90d supply, fill #0
  Filled 2022-02-12: qty 90, 90d supply, fill #1

## 2021-10-31 ENCOUNTER — Other Ambulatory Visit: Payer: Self-pay

## 2021-11-04 ENCOUNTER — Other Ambulatory Visit: Payer: Self-pay

## 2021-11-28 ENCOUNTER — Other Ambulatory Visit: Payer: Self-pay

## 2021-12-03 ENCOUNTER — Other Ambulatory Visit: Payer: Self-pay

## 2021-12-04 ENCOUNTER — Ambulatory Visit
Admission: RE | Admit: 2021-12-04 | Discharge: 2021-12-04 | Disposition: A | Discharge status: Home / Self Care | Payer: 59 | Source: Ambulatory Visit | Attending: Internal Medicine | Admitting: Internal Medicine

## 2021-12-04 DIAGNOSIS — E2839 Other primary ovarian failure: Secondary | ICD-10-CM | POA: Insufficient documentation

## 2021-12-04 DIAGNOSIS — Z780 Asymptomatic menopausal state: Secondary | ICD-10-CM | POA: Diagnosis not present

## 2021-12-04 DIAGNOSIS — M8589 Other specified disorders of bone density and structure, multiple sites: Secondary | ICD-10-CM | POA: Diagnosis not present

## 2021-12-11 ENCOUNTER — Other Ambulatory Visit: Payer: Self-pay | Admitting: Internal Medicine

## 2021-12-12 ENCOUNTER — Other Ambulatory Visit: Payer: Self-pay

## 2021-12-12 MED ORDER — METFORMIN HCL ER 500 MG PO TB24
500.0000 mg | ORAL_TABLET | Freq: Two times a day (BID) | ORAL | 5 refills | Status: DC
  Filled 2021-12-12: qty 60, 30d supply, fill #0
  Filled 2022-01-15: qty 60, 30d supply, fill #1
  Filled 2022-02-21: qty 60, 30d supply, fill #2
  Filled 2022-03-21: qty 60, 30d supply, fill #3

## 2022-01-01 ENCOUNTER — Other Ambulatory Visit: Payer: Self-pay

## 2022-01-06 ENCOUNTER — Other Ambulatory Visit: Payer: Self-pay | Admitting: Internal Medicine

## 2022-01-07 ENCOUNTER — Ambulatory Visit (INDEPENDENT_AMBULATORY_CARE_PROVIDER_SITE_OTHER): Payer: 59 | Admitting: Internal Medicine

## 2022-01-07 ENCOUNTER — Encounter: Payer: Self-pay | Admitting: Internal Medicine

## 2022-01-07 ENCOUNTER — Other Ambulatory Visit (HOSPITAL_COMMUNITY)
Admission: RE | Admit: 2022-01-07 | Discharge: 2022-01-07 | Disposition: A | Discharge status: Home / Self Care | Payer: 59 | Source: Ambulatory Visit | Attending: Internal Medicine | Admitting: Internal Medicine

## 2022-01-07 ENCOUNTER — Other Ambulatory Visit: Payer: Self-pay

## 2022-01-07 ENCOUNTER — Other Ambulatory Visit: Payer: Self-pay | Admitting: Internal Medicine

## 2022-01-07 VITALS — BP 120/70 | HR 91 | Temp 98.6°F | Ht 62.0 in | Wt 179.2 lb

## 2022-01-07 DIAGNOSIS — E1165 Type 2 diabetes mellitus with hyperglycemia: Secondary | ICD-10-CM | POA: Diagnosis not present

## 2022-01-07 DIAGNOSIS — I10 Essential (primary) hypertension: Secondary | ICD-10-CM | POA: Diagnosis not present

## 2022-01-07 DIAGNOSIS — R7989 Other specified abnormal findings of blood chemistry: Secondary | ICD-10-CM | POA: Diagnosis not present

## 2022-01-07 DIAGNOSIS — Z0000 Encounter for general adult medical examination without abnormal findings: Secondary | ICD-10-CM

## 2022-01-07 DIAGNOSIS — Z124 Encounter for screening for malignant neoplasm of cervix: Secondary | ICD-10-CM | POA: Diagnosis not present

## 2022-01-07 DIAGNOSIS — F439 Reaction to severe stress, unspecified: Secondary | ICD-10-CM

## 2022-01-07 DIAGNOSIS — E7800 Pure hypercholesterolemia, unspecified: Secondary | ICD-10-CM | POA: Diagnosis not present

## 2022-01-07 DIAGNOSIS — Z860100 Personal history of colon polyps, unspecified: Secondary | ICD-10-CM

## 2022-01-07 LAB — BASIC METABOLIC PANEL
BUN: 20 mg/dL (ref 6–23)
CO2: 27 mEq/L (ref 19–32)
Calcium: 9.8 mg/dL (ref 8.4–10.5)
Chloride: 103 mEq/L (ref 96–112)
Creatinine, Ser: 0.92 mg/dL (ref 0.40–1.20)
GFR: 65.37 mL/min (ref >60.00)
Glucose, Bld: 89 mg/dL (ref 70–99)
Potassium: 4 mEq/L (ref 3.5–5.1)
Sodium: 138 mEq/L (ref 135–145)

## 2022-01-07 LAB — CBC WITH DIFFERENTIAL/PLATELET
Basophils Absolute: 0.1 10*3/uL (ref 0.0–0.1)
Basophils Relative: 0.7 % (ref 0.0–3.0)
Eosinophils Absolute: 0.2 10*3/uL (ref 0.0–0.7)
Eosinophils Relative: 2.6 % (ref 0.0–5.0)
HCT: 38 % (ref 36.0–46.0)
Hemoglobin: 12.9 g/dL (ref 12.0–15.0)
Lymphocytes Relative: 30.6 % (ref 12.0–46.0)
Lymphs Abs: 2.5 10*3/uL (ref 0.7–4.0)
MCHC: 34 g/dL (ref 30.0–36.0)
MCV: 86 fl (ref 78.0–100.0)
Monocytes Absolute: 0.5 10*3/uL (ref 0.1–1.0)
Monocytes Relative: 6.8 % (ref 3.0–12.0)
Neutro Abs: 4.8 10*3/uL (ref 1.4–7.7)
Neutrophils Relative %: 59.3 % (ref 43.0–77.0)
Platelets: 249 10*3/uL (ref 150.0–400.0)
RBC: 4.42 Mil/uL (ref 3.87–5.11)
RDW: 13.6 % (ref 11.5–15.5)
WBC: 8.1 10*3/uL (ref 4.0–10.5)

## 2022-01-07 LAB — HEPATIC FUNCTION PANEL
ALT: 37 U/L — ABNORMAL HIGH (ref 0–35)
AST: 23 U/L (ref 0–37)
Albumin: 4.5 g/dL (ref 3.5–5.2)
Alkaline Phosphatase: 54 U/L (ref 39–117)
Bilirubin, Direct: 0.1 mg/dL (ref 0.0–0.3)
Total Bilirubin: 0.6 mg/dL (ref 0.2–1.2)
Total Protein: 7 g/dL (ref 6.0–8.3)

## 2022-01-07 LAB — LIPID PANEL
Cholesterol: 135 mg/dL (ref 0–200)
HDL: 45.3 mg/dL (ref >39.00)
LDL Cholesterol: 63 mg/dL (ref 0–99)
NonHDL: 89.85
Total CHOL/HDL Ratio: 3
Triglycerides: 132 mg/dL (ref 0.0–149.0)
VLDL: 26.4 mg/dL (ref 0.0–40.0)

## 2022-01-07 LAB — HEMOGLOBIN A1C: Hgb A1c MFr Bld: 5.9 % (ref 4.6–6.5)

## 2022-01-07 MED FILL — Semaglutide Soln Pen-inj 1 MG/DOSE (4 MG/3ML): SUBCUTANEOUS | 28 days supply | Qty: 3 | Fill #0 | Status: AC

## 2022-01-07 NOTE — Assessment & Plan Note (Signed)
Just had colonoscopy 01/2021 - Dr Wohl - diverticulosis and non bleeding internal hemorrhoids.  Recommended f/u colonoscopy in 7 years.   

## 2022-01-07 NOTE — Progress Notes (Unsigned)
[name], female   DOB: Nov 17, 1955, 66 y.o.   MRN: [id number]   Subjective:    [name], female    DOB: 1955-11-12, 66 y.o.   MRN: [id number]   Patient here for  Chief Complaint  Patient presents with   Follow-up    Complete physical exam   .   HPI States she is doing well.  Walking more with her new puppy.  No chest pain or sob reported.  No abdominal pain.  Bowels moving.  Recent bone density - osteopenia.  Taking calcium and vitamin D.  Weight bearing exercise.  No cough or congestion reported.  Blood pressure doing well.    Past Medical History:  Diagnosis Date   Abnormal liver function tests    Allergy    Anemia    H/O   Arthritis    RA   Depression    Diabetes mellitus without complication ([place])    Fatty liver    Hypercholesterolemia    Hypertension    Migraine headache    better since menopause   Neuromuscular disorder (HCC)    Recurrent sinusitis    allergies   Tuberculosis 1980'S   + TB TEST-WAS TREATED   Past Surgical History:  Procedure Laterality Date   BILATERAL CARPAL TUNNEL RELEASE     BREAST BIOPSY Left 05/12/2017   FRAGMENTS WITH FEATURES OF AN EARLY INTRADUCTAL PAPILLOMA WITH aprocine changes   BREAST BIOPSY Left 06/12/2017   Procedure: BREAST BIOPSY;  Surgeon: Robert Bellow, MD;  Location: [facility];  Service: General;  Laterality: Left;   BREAST DUCTAL SYSTEM EXCISION Left 06/12/2017   Procedure: EXCISION DUCTAL SYSTEM BREAST;  Surgeon: Robert Bellow, MD;  Location: [facility];  Service: General;  Laterality: Left;   BREAST EXCISIONAL BIOPSY Left 2004   x 2   COLONOSCOPY WITH PROPOFOL N/A 02/11/2016   Procedure: COLONOSCOPY WITH PROPOFOL;  Surgeon: Lucilla Lame, MD;  Location: [place];  Service: Endoscopy;  Laterality: N/A;   COLONOSCOPY WITH PROPOFOL N/A 02/18/2021   Procedure: COLONOSCOPY WITH PROPOFOL;  Surgeon: Lucilla Lame, MD;  Location: [place];  Service: Endoscopy;   Laterality: N/A;   EXCISION MORTON'S NEUROMA Left 06/01/2015   Procedure: EXCISION MORTON'S NEUROMA;  Surgeon: Sharlotte Alamo, DPM;  Location: [facility];  Service: Podiatry;  Laterality: Left;  Local w/ mac   FRACTURE SURGERY  2017   OSTECTOMY Left 06/01/2015   Procedure: OSTECTOMY/ part. excision fracture fragment left 4th toe;  Surgeon: Sharlotte Alamo, DPM;  Location: [facility];  Service: Podiatry;  Laterality: Left;   POLYPECTOMY N/A 02/11/2016   Procedure: POLYPECTOMY;  Surgeon: Lucilla Lame, MD;  Location: [place];  Service: Endoscopy;  Laterality: N/A;   RHINOPLASTY     TUBAL LIGATION  1997   Family History  Problem Relation Age of Onset   Heart disease Father        died age 76 (MI)   Heart disease Other        4 uncles - CABG   Colon cancer Other        great aunt   Heart disease Paternal Grandmother        first MI - 44s   Migraines Mother    Breast cancer Mother 15   Hypertension Mother    Hypertension Maternal Grandmother    Early death Maternal Grandmother    Diabetes Paternal Grandfather    Stroke Paternal Grandfather    Depression Maternal Aunt  Breast cancer Other        1 mat and 1 pat  gr aunt   Social History   Socioeconomic History   Marital status: Married    Spouse name: Not on file   Number of children: 2   Years of education: Not on file   Highest education level: Not on file  Occupational History   Not on file  Tobacco Use   Smoking status: Never   Smokeless tobacco: Never  Vaping Use   Vaping Use: Never used  Substance and Sexual Activity   Alcohol use: Yes    Alcohol/week: 1.0 standard drink of alcohol    Types: 1 Cans of beer per week    Comment: occasional   Drug use: No   Sexual activity: Yes    Birth control/protection: Post-menopausal, Surgical  Other Topics Concern   Not on file  Social History Narrative   Not on file   Social Determinants of Health   Financial Resource Strain: Not on file  Food Insecurity: Not on file   Transportation Needs: Not on file  Physical Activity: Not on file  Stress: Not on file  Social Connections: Not on file     Review of Systems  Constitutional:  Negative for appetite change and unexpected weight change.  HENT:  Negative for congestion, sinus pressure and sore throat.   Eyes:  Negative for pain and visual disturbance.  Respiratory:  Negative for cough, chest tightness and shortness of breath.   Cardiovascular:  Negative for chest pain, palpitations and leg swelling.  Gastrointestinal:  Negative for abdominal pain, diarrhea, nausea and vomiting.  Genitourinary:  Negative for difficulty urinating and dysuria.  Musculoskeletal:  Negative for joint swelling and myalgias.  Skin:  Negative for color change and rash.  Neurological:  Negative for dizziness, light-headedness and headaches.  Hematological:  Negative for adenopathy. Does not bruise/bleed easily.  Psychiatric/Behavioral:  Negative for agitation and dysphoric mood.        Objective:     BP 120/70 (BP Location: Left Arm, Patient Position: Sitting, Cuff Size: Normal)   Pulse 91   Temp 98.6 F (37 C) (Oral)   Ht _0  (1.575 m)   Wt 179 lb 3.2 oz (81.3 kg)   LMP 05/30/2007   SpO2 97%   BMI 32.78 kg/m  Wt Readings from Last 3 Encounters:  01/07/22 179 lb 3.2 oz (81.3 kg)  09/05/21 192 lb 3.2 oz (87.2 kg)  05/06/21 196 lb (88.9 kg)    Physical Exam Vitals reviewed.  Constitutional:      General: She is not in acute distress.    Appearance: Normal appearance. She is well-developed.  HENT:     Head: Normocephalic and atraumatic.     Right Ear: External ear normal.     Left Ear: External ear normal.  Eyes:     General: No scleral icterus.       Right eye: No discharge.        Left eye: No discharge.     Conjunctiva/sclera: Conjunctivae normal.  Neck:     Thyroid: No thyromegaly.  Cardiovascular:     Rate and Rhythm: Normal rate and regular rhythm.  Pulmonary:     Effort: No tachypnea,  accessory muscle usage or respiratory distress.     Breath sounds: Normal breath sounds. No decreased breath sounds or wheezing.  Chest:  Breasts:    Right: No inverted nipple, mass, nipple discharge or tenderness (no axillary adenopathy).  Left: No inverted nipple, mass, nipple discharge or tenderness (no axilarry adenopathy).  Abdominal:     General: Bowel sounds are normal.     Palpations: Abdomen is soft.     Tenderness: There is no abdominal tenderness.  Genitourinary:    Comments: Normal external genitalia.  Vaginal vault without lesions.  Cervix identified.  Pap smear performed.  Could not appreciate any adnexal masses or tenderness.   Musculoskeletal:        General: No swelling or tenderness.     Cervical back: Neck supple.  Lymphadenopathy:     Cervical: No cervical adenopathy.  Skin:    Findings: No erythema or rash.  Neurological:     Mental Status: She is alert and oriented to person, place, and time.  Psychiatric:        Mood and Affect: Mood normal.        Behavior: Behavior normal.      Outpatient Encounter Medications as of 01/07/2022  Medication Sig   acetaminophen (TYLENOL) 500 MG tablet Take 1,000 mg by mouth daily as needed for moderate pain.    amLODipine (NORVASC) 10 MG tablet TAKE 1 TABLET BY MOUTH DAILY.   fexofenadine-pseudoephedrine (ALLEGRA-D) 60-120 MG 12 hr tablet Take 1 tablet by mouth daily as needed (allergies).    hydrochlorothiazide (HYDRODIURIL) 25 MG tablet Take 1 tablet (25 mg total) by mouth daily.   ibuprofen (ADVIL,MOTRIN) 200 MG tablet Take 600-800 mg by mouth daily as needed (arthritis).    lisinopril (ZESTRIL) 40 MG tablet TAKE 1 TABLET BY MOUTH DAILY.   loratadine (CLARITIN) 10 MG tablet Take 10 mg by mouth daily.   magnesium oxide (MAG-OX) 400 MG tablet Take 1 tablet (400 mg total) by mouth daily. (Patient taking differently: Take 400 mg by mouth 2 (two) times daily.)   metFORMIN (GLUCOPHAGE-XR) 500 MG 24 hr tablet Take 1 tablet  (500 mg total) by mouth 2 (two) times daily.   naproxen sodium (ANAPROX) 220 MG tablet Take 440 mg by mouth daily as needed (pain).    PARoxetine (PAXIL) 10 MG tablet Take 1 tablet (10 mg total) by mouth every morning.   rosuvastatin (CRESTOR) 10 MG tablet Take 1 tablet (10 mg total) by mouth daily.   [DISCONTINUED] Semaglutide, 1 MG/DOSE, 4 MG/3ML SOPN Inject 1 mg as directed once a week.   No facility-administered encounter medications on file as of 01/07/2022.     Lab Results  Component Value Date   WBC 8.1 01/07/2022   HGB 12.9 01/07/2022   HCT 38.0 01/07/2022   PLT 249.0 01/07/2022   GLUCOSE 89 01/07/2022   CHOL 135 01/07/2022   TRIG 132.0 01/07/2022   HDL 45.30 01/07/2022   LDLDIRECT 130.0 07/11/2019   LDLCALC 63 01/07/2022   ALT 37 (H) 01/07/2022   AST 23 01/07/2022   NA 138 01/07/2022   K 4.0 01/07/2022   CL 103 01/07/2022   CREATININE 0.92 01/07/2022   BUN 20 01/07/2022   CO2 27 01/07/2022   TSH 2.04 05/06/2021   HGBA1C 5.9 01/07/2022   MICROALBUR 2.0 (H) 05/06/2021    DG Bone Density  Result Date: 12/04/2021 EXAM: DUAL X-RAY ABSORPTIOMETRY (DXA) FOR BONE MINERAL DENSITY IMPRESSION: Your patient Tameika Heckmann completed a BMD test on 12/04/2021 using the [place] (software version: 14.10) manufactured by [employer]. The following summarizes the results of our evaluation. Technologist::TNB PATIENT BIOGRAPHICAL: Name: Jatziry, Wechter Patient ID: [id number] Birth Date: 1955/10/31 Height: 61.5 in. Gender: Female Exam Date:  12/04/2021 Weight: 177.0 lbs. Indications: Caucasian, Height Loss, Postmenopausal, Rheumatoid Arthritis Fractures: Nasal Bones Treatments: DENSITOMETRY RESULTS: Site         Region     Measured Date Measured Age WHO Classification Young Adult T-score BMD         %Change vs. Previous Significant Change (*) DualFemur Neck Right 12/04/2021 65.3 Osteopenia -1.5 0.833 g/cm2 - - Left Forearm Radius 33% 12/04/2021 65.3 Osteopenia -1.5 0.742  g/cm2 - - ASSESSMENT: The BMD measured at Forearm Radius 33% is 0.742 g/cm2 with a T-score of -1.5. This patient is considered osteopenic according to [facility]) criteria. L-Spine was excluded due to degenerative changes. The scan quality is good. World Pharmac[facility]) criteria for post-menopausal, Caucasian Women: Normal:                   T-score at or above -1 SD Osteopenia/low bone mass: T-score between -1 and -2.5 SD Osteoporosis:             T-score at or below -2.5 SD RECOMMENDATIONS: 1. All patients should optimize calcium and vitamin D intake. 2. Consider FDA-approved medical therapies in postmenopausal women and men aged 32 years and older, based on the following: a. A hip or vertebral(clinical or morphometric) fracture b. T-score < -2.5 at the femoral neck or spine after appropriate evaluation to exclude secondary causes c. Low bone mass (T-score between -1.0 and -2.5 at the femoral neck or spine) and a 10-year probability of a hip fracture > 3% or a 10-year probability of a major osteoporosis-related fracture > 20% based on the [place]-adapted WHO algorithm 3. Clinician judgment and/or patient preferences may indicate treatment for people with 10-year fracture probabilities above or below these levels FOLLOW-UP: People with diagnosed cases of osteoporosis or at high risk for fracture should have regular bone mineral density tests. For patients eligible for Medicare, routine testing is allowed once every 2 years. The testing frequency can be increased to one year for patients who have rapidly progressing disease, those who are receiving or discontinuing medical therapy to restore bone mass, or have additional risk factors. I have reviewed this report, and agree with the above findings. Mark A. Thornton Papas, M.D. [facility] Dear Einar Pheasant, Your patient STEFANA LODICO completed a FRAX assessment on 12/04/2021 using the [place] (analysis version: 14.10)  manufactured by [employer]. The following summarizes the results of our evaluation. PATIENT BIOGRAPHICAL: Name: Desirai, Traxler Patient ID: [id number] Birth Date: 01/22/1956 Height:    61.5 in. Gender:     Female    Age:        66.3       Weight:    177.0 lbs. Ethnicity:  White                            Exam Date: 12/04/2021 FRAX* RESULTS:  (version: 3.5) 10-year Probability of Fracture1 Major Osteoporotic Fracture2 Hip Fracture 10.8% 1.2% Population: [place] (Caucasian) Risk Factors: Rheumatoid Arthritis Based on Femur (Right) Neck BMD 1 -The 10-year probability of fracture may be lower than reported if the patient has received treatment. 2 -Major Osteoporotic Fracture: Clinical Spine, Forearm, Hip or Shoulder *FRAX is a Materials engineer of the [employer] for Metabolic Bone Disease, a [place] (WHO) [facility]. ASSESSMENT: The probability of a major osteoporotic fracture is 10.8% within the next ten years. The probability of a hip fracture is  1.2% within the next ten years. I have reviewed this report and agree with the above findings. Mark A. Thornton Papas, M.D. [facility] Electronically Signed   By: Lavonia Dana M.D.   On: 12/04/2021 13:49       Assessment & Plan:   Problem List Items Addressed This Visit     Abnormal liver function tests    Previous ultrasound revealed fatty liver.  Has adjusted diet/lost weight. Continue low carb diet, exercise and weight loss.  Follow liver function tests.       Relevant Orders   Hepatic function panel (Completed)   Health care maintenance    Physical today 01/07/22.  PAP 05/2018 - negative with negative HPV.  Repeat pap today. Colonoscopy 01/2021 - Dr Allen Norris - diverticulosis and non bleeding internal hemorrhoids.  Recommended f/u colonoscopy in 7 years.  Mammogram 05/23/21 - Birads I.       History of colonic polyps    Just had colonoscopy 01/2021 - Dr Allen Norris - diverticulosis and non bleeding internal  hemorrhoids.  Recommended f/u colonoscopy in 7 years.        Hypercholesterolemia    Low carb diet and exercise.  Has lost weight. On crestor.  Follow lipid panel and liver function tests.        Relevant Orders   CBC with Differential/Platelet (Completed)   Lipid panel (Completed)   Hypertension    Continue hctz, amlodipine and lisinopril.  Follow pressures.  Follow metabolic panel.       Stress    Overall appears to be handling things relatively well.  Follow.       Type 2 diabetes mellitus with hyperglycemia (HCC)    On metformin and ozempic. Tolerating.  Doing well. Has lost weight.  Low carb diet and exercise.  Follow met b and a1c.       Relevant Orders   Hemoglobin A1c (Completed)   Basic metabolic panel (Completed)   Other Visit Diagnoses     Routine general medical examination at a health care facility    -  Primary   Pap smear for cervical cancer screening       Relevant Orders   Cytology - PAP( [place])        Einar Pheasant, MD

## 2022-01-07 NOTE — Assessment & Plan Note (Addendum)
Physical today 01/07/22.  PAP 05/2018 - negative with negative HPV.  Repeat pap today. Colonoscopy 01/2021 - Dr Allen Norris - diverticulosis and non bleeding internal hemorrhoids.  Recommended f/u colonoscopy in 7 years.  Mammogram 05/23/21 - Birads I.

## 2022-01-08 ENCOUNTER — Encounter: Payer: Self-pay | Admitting: Internal Medicine

## 2022-01-08 LAB — CYTOLOGY - PAP
Comment: NEGATIVE
Diagnosis: NEGATIVE
High risk HPV: NEGATIVE

## 2022-01-08 NOTE — Assessment & Plan Note (Signed)
Low carb diet and exercise.  Has lost weight. On crestor.  Follow lipid panel and liver function tests.   

## 2022-01-08 NOTE — Assessment & Plan Note (Signed)
Previous ultrasound revealed fatty liver.  Has adjusted diet/lost weight. Continue low carb diet, exercise and weight loss.  Follow liver function tests.

## 2022-01-08 NOTE — Assessment & Plan Note (Signed)
Overall appears to be handling things relatively well.  Follow.   

## 2022-01-08 NOTE — Assessment & Plan Note (Signed)
Continue hctz, amlodipine and lisinopril.  Follow pressures.  Follow metabolic panel.  

## 2022-01-08 NOTE — Assessment & Plan Note (Signed)
On metformin and ozempic. Tolerating.  Doing well. Has lost weight.  Low carb diet and exercise.  Follow met b and a1c.

## 2022-01-15 ENCOUNTER — Other Ambulatory Visit: Payer: Self-pay

## 2022-01-20 DIAGNOSIS — E119 Type 2 diabetes mellitus without complications: Secondary | ICD-10-CM | POA: Diagnosis not present

## 2022-01-23 LAB — HM DIABETES EYE EXAM

## 2022-01-29 ENCOUNTER — Other Ambulatory Visit: Payer: Self-pay

## 2022-01-29 MED FILL — Semaglutide Soln Pen-inj 1 MG/DOSE (4 MG/3ML): SUBCUTANEOUS | 28 days supply | Qty: 3 | Fill #1 | Status: AC

## 2022-01-30 ENCOUNTER — Other Ambulatory Visit: Payer: Self-pay

## 2022-01-31 ENCOUNTER — Other Ambulatory Visit: Payer: Self-pay

## 2022-01-31 ENCOUNTER — Encounter: Payer: Self-pay | Admitting: Internal Medicine

## 2022-01-31 MED ORDER — COVID-19 MRNA VAC-TRIS(PFIZER) 30 MCG/0.3ML IM SUSY
PREFILLED_SYRINGE | INTRAMUSCULAR | 0 refills | Status: DC
  Filled 2022-01-31: qty 0.3, 1d supply, fill #0

## 2022-02-12 ENCOUNTER — Other Ambulatory Visit: Payer: Self-pay

## 2022-02-21 ENCOUNTER — Other Ambulatory Visit: Payer: Self-pay

## 2022-02-21 ENCOUNTER — Other Ambulatory Visit: Payer: Self-pay | Admitting: Internal Medicine

## 2022-02-23 ENCOUNTER — Other Ambulatory Visit: Payer: Self-pay

## 2022-02-24 ENCOUNTER — Other Ambulatory Visit: Payer: Self-pay

## 2022-02-24 MED FILL — Rosuvastatin Calcium Tab 10 MG: ORAL | 90 days supply | Qty: 90 | Fill #0 | Status: CN

## 2022-02-24 MED FILL — Amlodipine Besylate Tab 10 MG (Base Equivalent): ORAL | 90 days supply | Qty: 90 | Fill #0 | Status: CN

## 2022-02-25 ENCOUNTER — Other Ambulatory Visit: Payer: Self-pay

## 2022-02-25 MED FILL — Amlodipine Besylate Tab 10 MG (Base Equivalent): ORAL | 90 days supply | Qty: 90 | Fill #0 | Status: CN

## 2022-02-25 MED FILL — Rosuvastatin Calcium Tab 10 MG: ORAL | 90 days supply | Qty: 90 | Fill #0 | Status: CN

## 2022-02-26 ENCOUNTER — Other Ambulatory Visit: Payer: Self-pay

## 2022-02-26 MED FILL — Semaglutide Soln Pen-inj 1 MG/DOSE (4 MG/3ML): SUBCUTANEOUS | 28 days supply | Qty: 3 | Fill #2 | Status: AC

## 2022-02-26 MED FILL — Amlodipine Besylate Tab 10 MG (Base Equivalent): ORAL | 90 days supply | Qty: 90 | Fill #0 | Status: CN

## 2022-02-26 MED FILL — Rosuvastatin Calcium Tab 10 MG: ORAL | 90 days supply | Qty: 90 | Fill #0 | Status: CN

## 2022-03-02 ENCOUNTER — Other Ambulatory Visit: Payer: Self-pay

## 2022-03-21 MED FILL — Semaglutide Soln Pen-inj 1 MG/DOSE (4 MG/3ML): SUBCUTANEOUS | 28 days supply | Qty: 3 | Fill #3 | Status: AC

## 2022-04-01 ENCOUNTER — Other Ambulatory Visit: Payer: Self-pay

## 2022-04-21 ENCOUNTER — Other Ambulatory Visit: Payer: Self-pay | Admitting: Internal Medicine

## 2022-04-21 DIAGNOSIS — Z1231 Encounter for screening mammogram for malignant neoplasm of breast: Secondary | ICD-10-CM

## 2022-05-02 ENCOUNTER — Telehealth: Payer: Self-pay | Admitting: Internal Medicine

## 2022-05-02 ENCOUNTER — Encounter: Payer: Self-pay | Admitting: Internal Medicine

## 2022-05-02 NOTE — Telephone Encounter (Signed)
Pt need a refill on ozempic sent to [facility] on [address]. Pt has changed pharmacies.

## 2022-05-05 ENCOUNTER — Other Ambulatory Visit: Payer: Self-pay

## 2022-05-05 MED ORDER — OZEMPIC (1 MG/DOSE) 4 MG/3ML ~~LOC~~ SOPN: PEN_INJECTOR | SUBCUTANEOUS | 3 refills | Status: DC

## 2022-05-05 NOTE — Telephone Encounter (Signed)
sent

## 2022-05-12 ENCOUNTER — Ambulatory Visit: Payer: 59 | Admitting: Internal Medicine

## 2022-05-12 DIAGNOSIS — K080 Exfoliation of teeth due to systemic causes: Secondary | ICD-10-CM | POA: Diagnosis not present

## 2022-05-15 ENCOUNTER — Ambulatory Visit: Payer: 59 | Admitting: Internal Medicine

## 2022-05-16 ENCOUNTER — Ambulatory Visit (INDEPENDENT_AMBULATORY_CARE_PROVIDER_SITE_OTHER): Payer: Medicare Other | Admitting: Internal Medicine

## 2022-05-16 ENCOUNTER — Encounter: Payer: Self-pay | Admitting: Internal Medicine

## 2022-05-16 ENCOUNTER — Telehealth: Payer: Self-pay

## 2022-05-16 ENCOUNTER — Other Ambulatory Visit (HOSPITAL_COMMUNITY): Payer: Self-pay

## 2022-05-16 VITALS — BP 122/70 | HR 83 | Temp 97.9°F | Resp 16 | Ht 62.0 in | Wt 174.8 lb

## 2022-05-16 DIAGNOSIS — E7800 Pure hypercholesterolemia, unspecified: Secondary | ICD-10-CM

## 2022-05-16 DIAGNOSIS — Z86010 Personal history of colonic polyps: Secondary | ICD-10-CM | POA: Diagnosis not present

## 2022-05-16 DIAGNOSIS — R7989 Other specified abnormal findings of blood chemistry: Secondary | ICD-10-CM

## 2022-05-16 DIAGNOSIS — E1165 Type 2 diabetes mellitus with hyperglycemia: Secondary | ICD-10-CM | POA: Diagnosis not present

## 2022-05-16 DIAGNOSIS — F439 Reaction to severe stress, unspecified: Secondary | ICD-10-CM

## 2022-05-16 DIAGNOSIS — I10 Essential (primary) hypertension: Secondary | ICD-10-CM

## 2022-05-16 LAB — BASIC METABOLIC PANEL
BUN: 17 mg/dL (ref 6–23)
CO2: 28 mEq/L (ref 19–32)
Calcium: 10.1 mg/dL (ref 8.4–10.5)
Chloride: 102 mEq/L (ref 96–112)
Creatinine, Ser: 0.84 mg/dL (ref 0.40–1.20)
GFR: 72.73 mL/min (ref >60.00)
Glucose, Bld: 102 mg/dL — ABNORMAL HIGH (ref 70–99)
Potassium: 3.9 mEq/L (ref 3.5–5.1)
Sodium: 137 mEq/L (ref 135–145)

## 2022-05-16 LAB — MICROALBUMIN / CREATININE URINE RATIO
Creatinine,U: 47.1 mg/dL
Microalb Creat Ratio: 3.2 mg/g (ref 0.0–30.0)
Microalb, Ur: 1.5 mg/dL (ref 0.0–1.9)

## 2022-05-16 LAB — LIPID PANEL
Cholesterol: 154 mg/dL (ref 0–200)
HDL: 43.2 mg/dL (ref >39.00)
LDL Cholesterol: 77 mg/dL (ref 0–99)
NonHDL: 110.32
Total CHOL/HDL Ratio: 4
Triglycerides: 168 mg/dL — ABNORMAL HIGH (ref 0.0–149.0)
VLDL: 33.6 mg/dL (ref 0.0–40.0)

## 2022-05-16 LAB — HEPATIC FUNCTION PANEL
ALT: 38 U/L — ABNORMAL HIGH (ref 0–35)
AST: 26 U/L (ref 0–37)
Albumin: 4.7 g/dL (ref 3.5–5.2)
Alkaline Phosphatase: 49 U/L (ref 39–117)
Bilirubin, Direct: 0.2 mg/dL (ref 0.0–0.3)
Total Bilirubin: 0.7 mg/dL (ref 0.2–1.2)
Total Protein: 7.5 g/dL (ref 6.0–8.3)

## 2022-05-16 LAB — HM DIABETES FOOT EXAM

## 2022-05-16 LAB — TSH: TSH: 2.79 u[IU]/mL (ref 0.35–5.50)

## 2022-05-16 LAB — HEMOGLOBIN A1C: Hgb A1c MFr Bld: 5.9 % (ref 4.6–6.5)

## 2022-05-16 MED ORDER — PAROXETINE HCL 10 MG PO TABS: 10.0000 mg | ORAL_TABLET | Freq: Every morning | ORAL | 1 refills | Status: DC

## 2022-05-16 MED ORDER — HYDROCHLOROTHIAZIDE 25 MG PO TABS: 25.0000 mg | ORAL_TABLET | Freq: Every day | ORAL | 1 refills | Status: DC

## 2022-05-16 NOTE — Progress Notes (Unsigned)
Subjective:    [name], female    DOB: Oct 18, 1955, 67 y.o.   MRN: [id number]  Patient here for  Chief Complaint  Patient presents with   Medical Management of Chronic Issues    HPI Here for follow up - hypercholesterolemia and hypertension.  Has been on ozempic.  Has lost weight.  She has been out for two weeks.  Waiting on PA.  No chest pain or sob.  Stays active.  No abdominal pain or bowel issues.  Overall feels good.  Handling stress at work.    Past Medical History:  Diagnosis Date   Abnormal liver function tests    Allergy    Anemia    H/O   Arthritis    RA   Depression    Diabetes mellitus without complication ([place])    Fatty liver    Hypercholesterolemia    Hypertension    Migraine headache    better since menopause   Neuromuscular disorder (HCC)    Recurrent sinusitis    allergies   Tuberculosis 1980'S   + TB TEST-WAS TREATED   Past Surgical History:  Procedure Laterality Date   BILATERAL CARPAL TUNNEL RELEASE     BREAST BIOPSY Left 05/12/2017   FRAGMENTS WITH FEATURES OF AN EARLY INTRADUCTAL PAPILLOMA WITH aprocine changes   BREAST BIOPSY Left 06/12/2017   Procedure: BREAST BIOPSY;  Surgeon: Robert Bellow, MD;  Location: [facility];  Service: General;  Laterality: Left;   BREAST DUCTAL SYSTEM EXCISION Left 06/12/2017   Procedure: EXCISION DUCTAL SYSTEM BREAST;  Surgeon: Robert Bellow, MD;  Location: [facility];  Service: General;  Laterality: Left;   BREAST EXCISIONAL BIOPSY Left 2004   x 2   COLONOSCOPY WITH PROPOFOL N/A 02/11/2016   Procedure: COLONOSCOPY WITH PROPOFOL;  Surgeon: Lucilla Lame, MD;  Location: [place];  Service: Endoscopy;  Laterality: N/A;   COLONOSCOPY WITH PROPOFOL N/A 02/18/2021   Procedure: COLONOSCOPY WITH PROPOFOL;  Surgeon: Lucilla Lame, MD;  Location: [place];  Service: Endoscopy;  Laterality: N/A;   EXCISION MORTON'S NEUROMA Left 06/01/2015   Procedure: EXCISION MORTON'S NEUROMA;   Surgeon: Sharlotte Alamo, DPM;  Location: [facility];  Service: Podiatry;  Laterality: Left;  Local w/ mac   FRACTURE SURGERY  2017   OSTECTOMY Left 06/01/2015   Procedure: OSTECTOMY/ part. excision fracture fragment left 4th toe;  Surgeon: Sharlotte Alamo, DPM;  Location: [facility];  Service: Podiatry;  Laterality: Left;   POLYPECTOMY N/A 02/11/2016   Procedure: POLYPECTOMY;  Surgeon: Lucilla Lame, MD;  Location: [place];  Service: Endoscopy;  Laterality: N/A;   RHINOPLASTY     TUBAL LIGATION  1997   Family History  Problem Relation Age of Onset   Heart disease Father        died age 54 (MI)   Heart disease Other        4 uncles - CABG   Colon cancer Other        great aunt   Heart disease Paternal Grandmother        first MI - 71s   Migraines Mother    Breast cancer Mother 10   Hypertension Mother    Hypertension Maternal Grandmother    Early death Maternal Grandmother    Diabetes Paternal Grandfather    Stroke Paternal Grandfather    Depression Maternal Aunt    Breast cancer Other        1 mat and 1 pat  gr aunt  Social History   Socioeconomic History   Marital status: Married    Spouse name: Not on file   Number of children: 2   Years of education: Not on file   Highest education level: Not on file  Occupational History   Not on file  Tobacco Use   Smoking status: Never   Smokeless tobacco: Never  Vaping Use   Vaping Use: Never used  Substance and Sexual Activity   Alcohol use: Yes    Alcohol/week: 1.0 standard drink of alcohol    Types: 1 Cans of beer per week    Comment: occasional   Drug use: No   Sexual activity: Yes    Birth control/protection: Post-menopausal, Surgical  Other Topics Concern   Not on file  Social History Narrative   Not on file   Social Determinants of Health   Financial Resource Strain: Not on file  Food Insecurity: Not on file  Transportation Needs: Not on file  Physical Activity: Not on file  Stress: Not on file  Social  Connections: Not on file     Review of Systems  Constitutional:  Negative for appetite change, fatigue and unexpected weight change.  HENT:  Negative for sinus pressure.   Respiratory:  Negative for cough, chest tightness and shortness of breath.   Cardiovascular:  Negative for chest pain, palpitations and leg swelling.  Gastrointestinal:  Negative for abdominal pain, diarrhea, nausea and vomiting.  Genitourinary:  Negative for difficulty urinating and dysuria.  Musculoskeletal:  Negative for joint swelling and myalgias.  Skin:  Negative for color change and rash.  Neurological:  Negative for dizziness and headaches.  Psychiatric/Behavioral:  Negative for agitation and dysphoric mood.        Objective:     BP 122/70   Pulse 83   Temp 97.9 F (36.6 C)   Resp 16   Ht 5' 2"$  (1.575 m)   Wt 174 lb 12.8 oz (79.3 kg)   LMP 05/30/2007   SpO2 98%   BMI 31.97 kg/m  Wt Readings from Last 3 Encounters:  05/16/22 174 lb 12.8 oz (79.3 kg)  01/07/22 179 lb 3.2 oz (81.3 kg)  09/05/21 192 lb 3.2 oz (87.2 kg)    Physical Exam Vitals reviewed.  Constitutional:      General: She is not in acute distress.    Appearance: Normal appearance.  HENT:     Head: Normocephalic and atraumatic.     Right Ear: External ear normal.     Left Ear: External ear normal.  Eyes:     General: No scleral icterus.       Right eye: No discharge.        Left eye: No discharge.     Conjunctiva/sclera: Conjunctivae normal.  Neck:     Thyroid: No thyromegaly.  Cardiovascular:     Rate and Rhythm: Normal rate and regular rhythm.  Pulmonary:     Effort: No respiratory distress.     Breath sounds: Normal breath sounds. No wheezing.  Abdominal:     General: Bowel sounds are normal.     Palpations: Abdomen is soft.     Tenderness: There is no abdominal tenderness.  Musculoskeletal:        General: No swelling or tenderness.     Cervical back: Neck supple. No tenderness.  Lymphadenopathy:     Cervical:  No cervical adenopathy.  Skin:    Findings: No erythema or rash.  Neurological:     Mental Status: She is  alert.  Psychiatric:        Mood and Affect: Mood normal.        Behavior: Behavior normal.      Outpatient Encounter Medications as of 05/16/2022  Medication Sig   acetaminophen (TYLENOL) 500 MG tablet Take 1,000 mg by mouth daily as needed for moderate pain.    amLODipine (NORVASC) 10 MG tablet TAKE 1 TABLET BY MOUTH DAILY.   fexofenadine-pseudoephedrine (ALLEGRA-D) 60-120 MG 12 hr tablet Take 1 tablet by mouth daily as needed (allergies).    hydrochlorothiazide (HYDRODIURIL) 25 MG tablet Take 1 tablet (25 mg total) by mouth daily.   ibuprofen (ADVIL,MOTRIN) 200 MG tablet Take 600-800 mg by mouth daily as needed (arthritis).    lisinopril (ZESTRIL) 40 MG tablet TAKE 1 TABLET BY MOUTH DAILY.   loratadine (CLARITIN) 10 MG tablet Take 10 mg by mouth daily.   magnesium oxide (MAG-OX) 400 MG tablet Take 1 tablet (400 mg total) by mouth daily. (Patient taking differently: Take 400 mg by mouth 2 (two) times daily.)   metFORMIN (GLUCOPHAGE-XR) 500 MG 24 hr tablet Take 1 tablet (500 mg total) by mouth 2 (two) times daily.   naproxen sodium (ANAPROX) 220 MG tablet Take 440 mg by mouth daily as needed (pain).    PARoxetine (PAXIL) 10 MG tablet Take 1 tablet (10 mg total) by mouth every morning.   rosuvastatin (CRESTOR) 10 MG tablet Take 1 tablet (10 mg total) by mouth daily.   Semaglutide, 1 MG/DOSE, (OZEMPIC, 1 MG/DOSE,) 4 MG/3ML SOPN Inject 1 mg as directed once a week.   [DISCONTINUED] COVID-19 mRNA vaccine 2023-2024 ([facility]IRNATY) syringe Inject into the muscle.   No facility-administered encounter medications on file as of 05/16/2022.     Lab Results  Component Value Date   WBC 8.1 01/07/2022   HGB 12.9 01/07/2022   HCT 38.0 01/07/2022   PLT 249.0 01/07/2022   GLUCOSE 89 01/07/2022   CHOL 135 01/07/2022   TRIG 132.0 01/07/2022   HDL 45.30 01/07/2022   LDLDIRECT 130.0 07/11/2019    LDLCALC 63 01/07/2022   ALT 37 (H) 01/07/2022   AST 23 01/07/2022   NA 138 01/07/2022   K 4.0 01/07/2022   CL 103 01/07/2022   CREATININE 0.92 01/07/2022   BUN 20 01/07/2022   CO2 27 01/07/2022   TSH 2.04 05/06/2021   HGBA1C 5.9 01/07/2022   MICROALBUR 2.0 (H) 05/06/2021    No results found.     Assessment & Plan:  Type 2 diabetes mellitus with hyperglycemia, without long-term current use of insulin (HCC)  Hypercholesterolemia     Einar Pheasant, MD

## 2022-05-16 NOTE — Telephone Encounter (Signed)
Patient Advocate Encounter   Received notification from [facility] that prior authorization for Ozempic is required.   PA submitted on 05/16/2022 Key [id number] Status is pending

## 2022-05-17 ENCOUNTER — Encounter: Payer: Self-pay | Admitting: Internal Medicine

## 2022-05-17 NOTE — Assessment & Plan Note (Signed)
Overall appears to be handling things relatively well.  Follow.   

## 2022-05-17 NOTE — Assessment & Plan Note (Signed)
Just had colonoscopy 01/2021 - Dr Allen Norris - diverticulosis and non bleeding internal hemorrhoids.  Recommended f/u colonoscopy in 7 years.

## 2022-05-17 NOTE — Assessment & Plan Note (Signed)
On metformin and ozempic. Tolerating.  Doing well. Has lost weight.  Low carb diet and exercise.  Follow met b and a1c. Out of ozempic for two weeks.  Apparently waiting for PA.  Nurse checking on PA - in progress.  Notify if problems.

## 2022-05-17 NOTE — Assessment & Plan Note (Signed)
Continue hctz, amlodipine and lisinopril.  Follow pressures.  Follow metabolic panel.

## 2022-05-17 NOTE — Assessment & Plan Note (Signed)
Previous ultrasound revealed fatty liver.  Has adjusted diet/lost weight. Continue low carb diet, exercise and weight loss.  Follow liver function tests. Reports no increased alcohol intake.

## 2022-05-17 NOTE — Assessment & Plan Note (Signed)
Low carb diet and exercise.  Has lost weight. On crestor.  Follow lipid panel and liver function tests.

## 2022-05-19 NOTE — Telephone Encounter (Signed)
Pharmacy Patient Advocate Encounter  Prior Authorization for [facility] (1MG/DOSE) 4MG/3ML has been approved.     Effective dates: 05/15/2022 through 05/16/2022  [facility] Patient Advocate

## 2022-05-26 ENCOUNTER — Ambulatory Visit
Admission: RE | Admit: 2022-05-26 | Discharge: 2022-05-26 | Disposition: A | Discharge status: Home / Self Care | Payer: Medicare Other | Source: Ambulatory Visit | Attending: Internal Medicine | Admitting: Internal Medicine

## 2022-05-26 DIAGNOSIS — Z1231 Encounter for screening mammogram for malignant neoplasm of breast: Secondary | ICD-10-CM | POA: Insufficient documentation

## 2022-05-26 DIAGNOSIS — H43812 Vitreous degeneration, left eye: Secondary | ICD-10-CM | POA: Diagnosis not present

## 2022-05-27 DIAGNOSIS — K080 Exfoliation of teeth due to systemic causes: Secondary | ICD-10-CM | POA: Diagnosis not present

## 2022-06-03 ENCOUNTER — Other Ambulatory Visit: Payer: Self-pay

## 2022-06-03 ENCOUNTER — Other Ambulatory Visit: Payer: Self-pay | Admitting: Internal Medicine

## 2022-06-03 ENCOUNTER — Encounter: Payer: Self-pay | Admitting: Internal Medicine

## 2022-06-03 MED ORDER — LISINOPRIL 40 MG PO TABS: ORAL_TABLET | Freq: Every day | ORAL | 1 refills | Status: DC

## 2022-06-10 ENCOUNTER — Ambulatory Visit (INDEPENDENT_AMBULATORY_CARE_PROVIDER_SITE_OTHER): Payer: Medicare Other | Admitting: Family

## 2022-06-10 ENCOUNTER — Encounter: Payer: Self-pay | Admitting: Family

## 2022-06-10 VITALS — BP 122/80 | HR 88 | Temp 98.3°F | Ht 62.0 in | Wt 176.6 lb

## 2022-06-10 DIAGNOSIS — J40 Bronchitis, not specified as acute or chronic: Secondary | ICD-10-CM | POA: Diagnosis not present

## 2022-06-10 DIAGNOSIS — R051 Acute cough: Secondary | ICD-10-CM | POA: Diagnosis not present

## 2022-06-10 LAB — POC COVID19 BINAXNOW: SARS Coronavirus 2 Ag: NEGATIVE

## 2022-06-10 LAB — POCT RAPID STREP A (OFFICE): Rapid Strep A Screen: NEGATIVE

## 2022-06-10 MED ORDER — BENZONATATE 100 MG PO CAPS: 100.0000 mg | ORAL_CAPSULE | Freq: Three times a day (TID) | ORAL | 0 refills | Status: DC | PRN

## 2022-06-10 NOTE — Patient Instructions (Signed)
Please remain diligent with Mucinex and give medication more time as we discussed.  I have also sent in [place] to use as needed for cough.  Please let me know how you are doing

## 2022-06-10 NOTE — Assessment & Plan Note (Addendum)
Reassuring exam.  Afebrile and in no acute respiratory distress.  Discussed with patient I suspect URI is more viral in nature.  Negative COVID and strep today.  Patient just started Mucinex yesterday and advised her to continue Mucinex diligently with water to see if symptoms improve.  She will also start Tessalon Perles.  Work note provided.  She will let me know how she is doing

## 2022-06-10 NOTE — Progress Notes (Signed)
Assessment & Plan:  Bronchitis Assessment & Plan: Reassuring exam.  Afebrile and in no acute respiratory distress.  Discussed with patient I suspect URI is more viral in nature.  Negative COVID and strep today.  Patient just started Mucinex yesterday and advised her to continue Mucinex diligently with water to see if symptoms improve.  She will also start Tessalon Perles.  Work note provided.  She will let me know how she is doing  Orders: -     Benzonatate; Take 1 capsule (100 mg total) by mouth 3 (three) times daily as needed for cough.  Dispense: 30 capsule; Refill: 0  Acute cough -     POC COVID-19 BinaxNow -     POCT rapid strep A     Return precautions given.   Risks, benefits, and alternatives of the medications and treatment plan prescribed today were discussed, and patient expressed understanding.   Education regarding symptom management and diagnosis given to patient on AVS either electronically or printed.  No follow-ups on file.  Mable Paris, FNP  Subjective:    [name], female    DOB: 1956/03/29, 67 y.o.   MRN: [id number]  CC: RAECHEL DUNKLEBERGER is a 67 y.o. female who presents today for an acute visit.    HPI: Complains of thick green chest congestion x 5 days, unchanged.   Endorses cough, nasal congestion, PND  Tactile warmth last night.   No sinus pain, chills, sore throat ( resolved)  She has been allegra D, tylenol prn, aleve prn.  She started mucinex yesterday.   History of diabetes, hypertension Never smoker  Allergies: Patient has no known allergies. Current Outpatient Medications on File Prior to Visit  Medication Sig Dispense Refill   acetaminophen (TYLENOL) 500 MG tablet Take 1,000 mg by mouth daily as needed for moderate pain.      amLODipine (NORVASC) 10 MG tablet TAKE 1 TABLET BY MOUTH DAILY. 90 tablet 1   fexofenadine-pseudoephedrine (ALLEGRA-D) 60-120 MG 12 hr tablet Take 1 tablet by mouth daily as needed (allergies).       hydrochlorothiazide (HYDRODIURIL) 25 MG tablet Take 1 tablet (25 mg total) by mouth daily. 90 tablet 1   ibuprofen (ADVIL,MOTRIN) 200 MG tablet Take 600-800 mg by mouth daily as needed (arthritis).      lisinopril (ZESTRIL) 40 MG tablet TAKE 1 TABLET BY MOUTH DAILY. 90 tablet 1   loratadine (CLARITIN) 10 MG tablet Take 10 mg by mouth daily.     magnesium oxide (MAG-OX) 400 MG tablet Take 1 tablet (400 mg total) by mouth daily. (Patient taking differently: Take 400 mg by mouth 2 (two) times daily.) 90 tablet 1   metFORMIN (GLUCOPHAGE-XR) 500 MG 24 hr tablet Take 1 tablet (500 mg total) by mouth 2 (two) times daily. 60 tablet 5   naproxen sodium (ANAPROX) 220 MG tablet Take 440 mg by mouth daily as needed (pain).      PARoxetine (PAXIL) 10 MG tablet Take 1 tablet (10 mg total) by mouth every morning. 90 tablet 1   rosuvastatin (CRESTOR) 10 MG tablet Take 1 tablet (10 mg total) by mouth daily. 90 tablet 1   Semaglutide, 1 MG/DOSE, (OZEMPIC, 1 MG/DOSE,) 4 MG/3ML SOPN Inject 1 mg as directed once a week. 3 mL 3   No current facility-administered medications on file prior to visit.    Review of Systems  Constitutional:  Negative for chills and fever.  HENT:  Positive for congestion and postnasal drip.  Respiratory:  Positive for cough. Negative for shortness of breath.   Cardiovascular:  Negative for chest pain and palpitations.  Gastrointestinal:  Negative for nausea and vomiting.      Objective:    BP 122/80   Pulse 88   Temp 98.3 F (36.8 C) (Oral)   Ht '5\' 2"'$  (1.575 m)   Wt 176 lb 9.6 oz (80.1 kg)   LMP 05/30/2007   SpO2 96%   BMI 32.30 kg/m   BP Readings from Last 3 Encounters:  06/10/22 122/80  05/16/22 122/70  01/07/22 120/70   Wt Readings from Last 3 Encounters:  06/10/22 176 lb 9.6 oz (80.1 kg)  05/16/22 174 lb 12.8 oz (79.3 kg)  01/07/22 179 lb 3.2 oz (81.3 kg)    Physical Exam Vitals reviewed.  Constitutional:      Appearance: She is well-developed.  HENT:      Head: Normocephalic and atraumatic.     Right Ear: Hearing, tympanic membrane, ear canal and external ear normal. No decreased hearing noted. No drainage, swelling or tenderness. No middle ear effusion. No foreign body. Tympanic membrane is not erythematous or bulging.     Left Ear: Hearing, tympanic membrane, ear canal and external ear normal. No decreased hearing noted. No drainage, swelling or tenderness.  No middle ear effusion. No foreign body. Tympanic membrane is not erythematous or bulging.     Nose: Nose normal. No rhinorrhea.     Right Sinus: No maxillary sinus tenderness or frontal sinus tenderness.     Left Sinus: No maxillary sinus tenderness or frontal sinus tenderness.     Mouth/Throat:     Pharynx: Uvula midline. No oropharyngeal exudate or posterior oropharyngeal erythema.     Tonsils: No tonsillar abscesses.  Eyes:     Conjunctiva/sclera: Conjunctivae normal.  Cardiovascular:     Rate and Rhythm: Regular rhythm.     Pulses: Normal pulses.     Heart sounds: Normal heart sounds.  Pulmonary:     Effort: Pulmonary effort is normal.     Breath sounds: Normal breath sounds. No wheezing, rhonchi or rales.  Lymphadenopathy:     Head:     Right side of head: No submental, submandibular, tonsillar, preauricular, posterior auricular or occipital adenopathy.     Left side of head: No submental, submandibular, tonsillar, preauricular, posterior auricular or occipital adenopathy.     Cervical: No cervical adenopathy.  Skin:    General: Skin is warm and dry.  Neurological:     Mental Status: She is alert.  Psychiatric:        Speech: Speech normal.        Behavior: Behavior normal.        Thought Content: Thought content normal.

## 2022-06-18 DIAGNOSIS — K080 Exfoliation of teeth due to systemic causes: Secondary | ICD-10-CM | POA: Diagnosis not present

## 2022-07-14 ENCOUNTER — Encounter: Payer: Self-pay | Admitting: Internal Medicine

## 2022-07-14 ENCOUNTER — Other Ambulatory Visit: Payer: Self-pay

## 2022-07-14 MED ORDER — METFORMIN HCL ER 500 MG PO TB24: 500.0000 mg | ORAL_TABLET | Freq: Two times a day (BID) | ORAL | 5 refills | Status: DC

## 2022-07-28 ENCOUNTER — Telehealth: Payer: Self-pay

## 2022-07-28 NOTE — Telephone Encounter (Signed)
LMTCB regarding my chart message daughter sent.

## 2022-08-01 NOTE — Telephone Encounter (Signed)
LMTCB

## 2022-08-04 NOTE — Telephone Encounter (Signed)
Pt returning call

## 2022-08-05 NOTE — Telephone Encounter (Signed)
FYI   Called patient to follow up from fall that daughter made [place] aware of in April. Was not evaluated after fall but denies any acute issues since. Head laceration healing. Denies any head pain, head aches, blurred vision, etc. Did not feel that anything further is needed at this time. Offered to move up appt in June but patient felt it was ok to wait until scheduled time.

## 2022-08-05 NOTE — Telephone Encounter (Signed)
Agree, I would prefer to see her for earlier appt.  If she is declining, she is to be evaluated or let us know if anything changes, if wound does not heal, etc.

## 2022-08-06 NOTE — Telephone Encounter (Signed)
LMTCB

## 2022-08-07 ENCOUNTER — Ambulatory Visit: Payer: Medicare Other

## 2022-08-07 ENCOUNTER — Telehealth: Payer: Self-pay

## 2022-08-07 NOTE — Telephone Encounter (Signed)
Spoke with patient. Does not feel like earlier appt needed. Agreed to be evaluated if anything acute.

## 2022-08-07 NOTE — Telephone Encounter (Signed)
error 

## 2022-08-07 NOTE — Telephone Encounter (Deleted)
error 

## 2022-08-11 DIAGNOSIS — H43812 Vitreous degeneration, left eye: Secondary | ICD-10-CM | POA: Diagnosis not present

## 2022-08-12 ENCOUNTER — Telehealth: Payer: Self-pay | Admitting: Internal Medicine

## 2022-08-12 NOTE — Telephone Encounter (Signed)
Copied from CRM [id number]. Topic: Medicare AWV >> Aug 12, 2022  1:41 PM Payton Doughty wrote: Reason for CRM: Called patient to reschedule Medicare Annual Wellness Visit (AWV). Left message for patient to call back and reschedule Medicare Annual Wellness Visit (AWV) on 08/28/22.  Please call to r/s.  Please schedule an appointment at any time with Sydell Axon, CMA  .  If any questions, please contact me.  Thank you ,  Verlee Rossetti; Care Guide Ambulatory Clinical Support [place] [facility] Direct Dial: [phone]

## 2022-08-13 ENCOUNTER — Telehealth: Payer: Self-pay | Admitting: Internal Medicine

## 2022-08-13 NOTE — Telephone Encounter (Signed)
Contacted Raechel L Geer to schedule their annual wellness visit. Appointment made for 09/24/2022.  Verlee Rossetti; Care Guide Ambulatory Clinical Support [place] [facility] Direct Dial: [phone]

## 2022-09-17 ENCOUNTER — Other Ambulatory Visit: Payer: Medicare Other

## 2022-09-18 ENCOUNTER — Other Ambulatory Visit: Payer: Medicare Other

## 2022-09-22 ENCOUNTER — Ambulatory Visit: Payer: Medicare Other | Admitting: Internal Medicine

## 2022-09-22 ENCOUNTER — Other Ambulatory Visit: Payer: Self-pay | Admitting: Internal Medicine

## 2022-09-26 ENCOUNTER — Telehealth: Payer: Self-pay | Admitting: Internal Medicine

## 2022-09-26 DIAGNOSIS — E1165 Type 2 diabetes mellitus with hyperglycemia: Secondary | ICD-10-CM

## 2022-09-26 DIAGNOSIS — E7800 Pure hypercholesterolemia, unspecified: Secondary | ICD-10-CM

## 2022-09-26 NOTE — Telephone Encounter (Signed)
Patient need orders  °

## 2022-09-30 NOTE — Telephone Encounter (Signed)
Labs ordered.

## 2022-10-06 ENCOUNTER — Encounter: Payer: Self-pay | Admitting: Internal Medicine

## 2022-10-08 ENCOUNTER — Other Ambulatory Visit: Payer: Medicare Other

## 2022-10-13 ENCOUNTER — Ambulatory Visit: Payer: Medicare Other | Admitting: Internal Medicine

## 2022-10-22 ENCOUNTER — Other Ambulatory Visit (INDEPENDENT_AMBULATORY_CARE_PROVIDER_SITE_OTHER): Payer: Medicare Other

## 2022-10-22 DIAGNOSIS — E7800 Pure hypercholesterolemia, unspecified: Secondary | ICD-10-CM

## 2022-10-22 DIAGNOSIS — E1165 Type 2 diabetes mellitus with hyperglycemia: Secondary | ICD-10-CM

## 2022-10-22 LAB — BASIC METABOLIC PANEL
BUN: 15 mg/dL (ref 6–23)
CO2: 27 mEq/L (ref 19–32)
Calcium: 10.2 mg/dL (ref 8.4–10.5)
Chloride: 101 mEq/L (ref 96–112)
Creatinine, Ser: 1.02 mg/dL (ref 0.40–1.20)
GFR: 57.44 mL/min — ABNORMAL LOW (ref >60.00)
Glucose, Bld: 98 mg/dL (ref 70–99)
Potassium: 3.8 mEq/L (ref 3.5–5.1)
Sodium: 138 mEq/L (ref 135–145)

## 2022-10-22 LAB — HEPATIC FUNCTION PANEL
ALT: 29 U/L (ref 0–35)
AST: 25 U/L (ref 0–37)
Albumin: 4.7 g/dL (ref 3.5–5.2)
Alkaline Phosphatase: 48 U/L (ref 39–117)
Bilirubin, Direct: 0.2 mg/dL (ref 0.0–0.3)
Total Bilirubin: 0.8 mg/dL (ref 0.2–1.2)
Total Protein: 7.4 g/dL (ref 6.0–8.3)

## 2022-10-22 LAB — LIPID PANEL
Cholesterol: 137 mg/dL (ref 0–200)
HDL: 43.5 mg/dL (ref >39.00)
LDL Cholesterol: 68 mg/dL (ref 0–99)
NonHDL: 93.4
Total CHOL/HDL Ratio: 3
Triglycerides: 125 mg/dL (ref 0.0–149.0)
VLDL: 25 mg/dL (ref 0.0–40.0)

## 2022-10-22 LAB — HEMOGLOBIN A1C: Hgb A1c MFr Bld: 5.8 % (ref 4.6–6.5)

## 2022-10-27 ENCOUNTER — Ambulatory Visit (INDEPENDENT_AMBULATORY_CARE_PROVIDER_SITE_OTHER): Payer: Medicare Other | Admitting: Internal Medicine

## 2022-10-27 ENCOUNTER — Encounter: Payer: Self-pay | Admitting: Internal Medicine

## 2022-10-27 VITALS — BP 126/70 | HR 90 | Temp 98.0°F | Resp 16 | Ht 62.0 in | Wt 175.0 lb

## 2022-10-27 DIAGNOSIS — Z86010 Personal history of colonic polyps: Secondary | ICD-10-CM

## 2022-10-27 DIAGNOSIS — Z7985 Long-term (current) use of injectable non-insulin antidiabetic drugs: Secondary | ICD-10-CM | POA: Diagnosis not present

## 2022-10-27 DIAGNOSIS — E1165 Type 2 diabetes mellitus with hyperglycemia: Secondary | ICD-10-CM | POA: Diagnosis not present

## 2022-10-27 DIAGNOSIS — Z7984 Long term (current) use of oral hypoglycemic drugs: Secondary | ICD-10-CM | POA: Diagnosis not present

## 2022-10-27 DIAGNOSIS — E7800 Pure hypercholesterolemia, unspecified: Secondary | ICD-10-CM | POA: Diagnosis not present

## 2022-10-27 DIAGNOSIS — I10 Essential (primary) hypertension: Secondary | ICD-10-CM

## 2022-10-27 DIAGNOSIS — R7989 Other specified abnormal findings of blood chemistry: Secondary | ICD-10-CM

## 2022-10-27 MED ORDER — AMLODIPINE BESYLATE 10 MG PO TABS: ORAL_TABLET | Freq: Every day | ORAL | 1 refills | Status: DC

## 2022-10-27 MED ORDER — PAROXETINE HCL 10 MG PO TABS: 10.0000 mg | ORAL_TABLET | Freq: Every morning | ORAL | 1 refills | Status: DC

## 2022-10-27 MED ORDER — LISINOPRIL 40 MG PO TABS: ORAL_TABLET | Freq: Every day | ORAL | 1 refills | Status: DC

## 2022-10-27 MED ORDER — ROSUVASTATIN CALCIUM 10 MG PO TABS: 10.0000 mg | ORAL_TABLET | Freq: Every day | ORAL | 1 refills | Status: DC

## 2022-10-27 MED ORDER — HYDROCHLOROTHIAZIDE 25 MG PO TABS: 25.0000 mg | ORAL_TABLET | Freq: Every day | ORAL | 1 refills | Status: DC

## 2022-10-27 MED ORDER — METFORMIN HCL ER 500 MG PO TB24: 500.0000 mg | ORAL_TABLET | Freq: Two times a day (BID) | ORAL | 1 refills | Status: DC

## 2022-10-27 MED ORDER — OZEMPIC (1 MG/DOSE) 4 MG/3ML ~~LOC~~ SOPN: PEN_INJECTOR | SUBCUTANEOUS | 3 refills | Status: DC

## 2022-10-27 NOTE — Progress Notes (Signed)
Subjective:    [name], female    DOB: 02-25-1956, 67 y.o.   MRN: [id number]  Patient here for  Chief Complaint  Patient presents with   Medical Management of Chronic Issues    HPI Here for follow up - hypercholesterolemia and hypertension.  Has been on ozempic. Doing well on ozempic.  Tolerating.  No nausea.  No abdominal pain.  No bowel issues reported.  No chest pain or sob reported.  No cough or congestion.  Handling stress.    Past Medical History:  Diagnosis Date   Abnormal liver function tests    Allergy    Anemia    H/O   Arthritis    RA   Depression    Diabetes mellitus without complication (HCC)    Fatty liver    Hypercholesterolemia    Hypertension    Migraine headache    better since menopause   Neuromuscular disorder (HCC)    Recurrent sinusitis    allergies   Tuberculosis 1980'S   + TB TEST-WAS TREATED   Past Surgical History:  Procedure Laterality Date   BILATERAL CARPAL TUNNEL RELEASE     BREAST BIOPSY Left 05/12/2017   FRAGMENTS WITH FEATURES OF AN EARLY INTRADUCTAL PAPILLOMA WITH aprocine changes   BREAST BIOPSY Left 06/12/2017   Procedure: BREAST BIOPSY;  Surgeon: Earline Mayotte, MD;  Location: [facility];  Service: General;  Laterality: Left;   BREAST DUCTAL SYSTEM EXCISION Left 06/12/2017   Procedure: EXCISION DUCTAL SYSTEM BREAST;  Surgeon: Earline Mayotte, MD;  Location: [facility];  Service: General;  Laterality: Left;   BREAST EXCISIONAL BIOPSY Left 2004   x 2   COLONOSCOPY WITH PROPOFOL N/A 02/11/2016   Procedure: COLONOSCOPY WITH PROPOFOL;  Surgeon: Midge Minium, MD;  Location: [facility];  Service: Endoscopy;  Laterality: N/A;   COLONOSCOPY WITH PROPOFOL N/A 02/18/2021   Procedure: COLONOSCOPY WITH PROPOFOL;  Surgeon: Midge Minium, MD;  Location: [facility];  Service: Endoscopy;  Laterality: N/A;   EXCISION MORTON'S NEUROMA Left 06/01/2015   Procedure: EXCISION MORTON'S NEUROMA;  Surgeon: Linus Galas,  DPM;  Location: [facility];  Service: Podiatry;  Laterality: Left;  Local w/ mac   FRACTURE SURGERY  2017   OSTECTOMY Left 06/01/2015   Procedure: OSTECTOMY/ part. excision fracture fragment left 4th toe;  Surgeon: Linus Galas, DPM;  Location: [facility];  Service: Podiatry;  Laterality: Left;   POLYPECTOMY N/A 02/11/2016   Procedure: POLYPECTOMY;  Surgeon: Midge Minium, MD;  Location: [facility];  Service: Endoscopy;  Laterality: N/A;   RHINOPLASTY     TUBAL LIGATION  1997   Family History  Problem Relation Age of Onset   Heart disease Father        died age 18 (MI)   Heart disease Other        4 uncles - CABG   Colon cancer Other        great aunt   Heart disease Paternal Grandmother        first MI - 47s   Migraines Mother    Breast cancer Mother 23   Hypertension Mother    Hypertension Maternal Grandmother    Early death Maternal Grandmother    Diabetes Paternal Grandfather    Stroke Paternal Grandfather    Depression Maternal Aunt    Breast cancer Other        1 mat and 1 pat  gr aunt   Social History   Socioeconomic History  Marital status: Married    Spouse name: Not on file   Number of children: 2   Years of education: Not on file   Highest education level: Associate degree: occupational, Scientist, product/process development, or vocational program  Occupational History   Not on file  Tobacco Use   Smoking status: Never   Smokeless tobacco: Never  Vaping Use   Vaping status: Never Used  Substance and Sexual Activity   Alcohol use: Yes    Alcohol/week: 1.0 standard drink of alcohol    Types: 1 Cans of beer per week    Comment: occasional   Drug use: No   Sexual activity: Yes    Birth control/protection: Post-menopausal, Surgical  Other Topics Concern   Not on file  Social History Narrative   Not on file   Social Determinants of Health   Financial Resource Strain: Low Risk  (10/23/2022)   Overall Financial Resource Strain (CARDIA)    Difficulty of Paying Living Expenses: Not  very hard  Food Insecurity: No Food Insecurity (10/23/2022)   Hunger Vital Sign    Worried About Running Out of Food in the Last Year: Never true    Ran Out of Food in the Last Year: Never true  Transportation Needs: No Transportation Needs (10/23/2022)   PRAPARE - Administrator, Civil Service (Medical): No    Lack of Transportation (Non-Medical): No  Physical Activity: Insufficiently Active (10/23/2022)   Exercise Vital Sign    Days of Exercise per Week: 2 days    Minutes of Exercise per Session: 30 min  Stress: No Stress Concern Present (10/23/2022)   [employer] of Occupational Health - Occupational Stress Questionnaire    Feeling of Stress : Only a little  Social Connections: Socially Integrated (10/23/2022)   Social Connection and Isolation Panel [NHANES]    Frequency of Communication with Friends and Family: Once a week    Frequency of Social Gatherings with Friends and Family: Twice a week    Attends Religious Services: More than 4 times per year    Active Member of [employer] or Organizations: Yes    Attends Engineer, structural: More than 4 times per year    Marital Status: Married     Review of Systems  Constitutional:  Negative for appetite change and unexpected weight change.  HENT:  Negative for congestion and sinus pressure.   Respiratory:  Negative for cough, chest tightness and shortness of breath.   Cardiovascular:  Negative for chest pain, palpitations and leg swelling.  Gastrointestinal:  Negative for abdominal pain, diarrhea, nausea and vomiting.  Genitourinary:  Negative for difficulty urinating and dysuria.  Musculoskeletal:  Negative for joint swelling and myalgias.  Skin:  Negative for color change and rash.  Neurological:  Negative for dizziness and headaches.  Psychiatric/Behavioral:  Negative for agitation and dysphoric mood.        Objective:     BP 126/70   Pulse 90   Temp 98 F (36.7 C)   Resp 16   Ht 5\' 2"  (1.575 m)    Wt 175 lb (79.4 kg)   LMP 05/30/2007   SpO2 98%   BMI 32.01 kg/m  Wt Readings from Last 3 Encounters:  10/27/22 175 lb (79.4 kg)  06/10/22 176 lb 9.6 oz (80.1 kg)  05/16/22 174 lb 12.8 oz (79.3 kg)    Physical Exam Vitals reviewed.  Constitutional:      General: She is not in acute distress.    Appearance: Normal appearance.  HENT:     Head: Normocephalic and atraumatic.     Right Ear: External ear normal.     Left Ear: External ear normal.  Eyes:     General: No scleral icterus.       Right eye: No discharge.        Left eye: No discharge.     Conjunctiva/sclera: Conjunctivae normal.  Neck:     Thyroid: No thyromegaly.  Cardiovascular:     Rate and Rhythm: Normal rate and regular rhythm.  Pulmonary:     Effort: No respiratory distress.     Breath sounds: Normal breath sounds. No wheezing.  Abdominal:     General: Bowel sounds are normal.     Palpations: Abdomen is soft.     Tenderness: There is no abdominal tenderness.  Musculoskeletal:        General: No swelling or tenderness.     Cervical back: Neck supple. No tenderness.  Lymphadenopathy:     Cervical: No cervical adenopathy.  Skin:    Findings: No erythema or rash.  Neurological:     Mental Status: She is alert.  Psychiatric:        Mood and Affect: Mood normal.        Behavior: Behavior normal.      Outpatient Encounter Medications as of 10/27/2022  Medication Sig   acetaminophen (TYLENOL) 500 MG tablet Take 1,000 mg by mouth daily as needed for moderate pain.    amLODipine (NORVASC) 10 MG tablet TAKE 1 TABLET BY MOUTH DAILY.   fexofenadine-pseudoephedrine (ALLEGRA-D) 60-120 MG 12 hr tablet Take 1 tablet by mouth daily as needed (allergies).    hydrochlorothiazide (HYDRODIURIL) 25 MG tablet Take 1 tablet (25 mg total) by mouth daily.   ibuprofen (ADVIL,MOTRIN) 200 MG tablet Take 600-800 mg by mouth daily as needed (arthritis).    lisinopril (ZESTRIL) 40 MG tablet TAKE 1 TABLET BY MOUTH DAILY.    loratadine (CLARITIN) 10 MG tablet Take 10 mg by mouth daily.   magnesium oxide (MAG-OX) 400 MG tablet Take 1 tablet (400 mg total) by mouth daily. (Patient taking differently: Take 400 mg by mouth 2 (two) times daily.)   metFORMIN (GLUCOPHAGE-XR) 500 MG 24 hr tablet Take 1 tablet (500 mg total) by mouth 2 (two) times daily.   naproxen sodium (ANAPROX) 220 MG tablet Take 440 mg by mouth daily as needed (pain).    PARoxetine (PAXIL) 10 MG tablet Take 1 tablet (10 mg total) by mouth every morning.   rosuvastatin (CRESTOR) 10 MG tablet Take 1 tablet (10 mg total) by mouth daily.   Semaglutide, 1 MG/DOSE, (OZEMPIC, 1 MG/DOSE,) 4 MG/3ML SOPN 1 mg under the skin weekly   [DISCONTINUED] amLODipine (NORVASC) 10 MG tablet TAKE 1 TABLET BY MOUTH DAILY.   [DISCONTINUED] benzonatate (TESSALON) 100 MG capsule Take 1 capsule (100 mg total) by mouth 3 (three) times daily as needed for cough.   [DISCONTINUED] hydrochlorothiazide (HYDRODIURIL) 25 MG tablet Take 1 tablet (25 mg total) by mouth daily.   [DISCONTINUED] lisinopril (ZESTRIL) 40 MG tablet TAKE 1 TABLET BY MOUTH DAILY.   [DISCONTINUED] metFORMIN (GLUCOPHAGE-XR) 500 MG 24 hr tablet Take 1 tablet (500 mg total) by mouth 2 (two) times daily.   [DISCONTINUED] OZEMPIC, 1 MG/DOSE, 4 MG/3ML SOPN INJECT 1 MG UNDER THE SKIN ONCE A WEEK   [DISCONTINUED] PARoxetine (PAXIL) 10 MG tablet Take 1 tablet (10 mg total) by mouth every morning.   [DISCONTINUED] rosuvastatin (CRESTOR) 10 MG tablet Take 1 tablet (10 mg  total) by mouth daily.   No facility-administered encounter medications on file as of 10/27/2022.     Lab Results  Component Value Date   WBC 8.1 01/07/2022   HGB 12.9 01/07/2022   HCT 38.0 01/07/2022   PLT 249.0 01/07/2022   GLUCOSE 98 10/22/2022   CHOL 137 10/22/2022   TRIG 125.0 10/22/2022   HDL 43.50 10/22/2022   LDLDIRECT 130.0 07/11/2019   LDLCALC 68 10/22/2022   ALT 29 10/22/2022   AST 25 10/22/2022   NA 138 10/22/2022   K 3.8 10/22/2022    CL 101 10/22/2022   CREATININE 1.02 10/22/2022   BUN 15 10/22/2022   CO2 27 10/22/2022   TSH 2.79 05/16/2022   HGBA1C 5.8 10/22/2022   MICROALBUR 1.5 05/16/2022    MM 3D SCREEN BREAST BILATERAL  Result Date: 05/28/2022 CLINICAL DATA:  Screening. EXAM: DIGITAL SCREENING BILATERAL MAMMOGRAM WITH TOMOSYNTHESIS AND CAD TECHNIQUE: Bilateral screening digital craniocaudal and mediolateral oblique mammograms were obtained. Bilateral screening digital breast tomosynthesis was performed. The images were evaluated with computer-aided detection. COMPARISON:  Previous exam(s). ACR Breast Density Category c: The breasts are heterogeneously dense, which may obscure small masses. FINDINGS: There are no findings suspicious for malignancy. IMPRESSION: No mammographic evidence of malignancy. A result letter of this screening mammogram will be mailed directly to the patient. RECOMMENDATION: Screening mammogram in one year. (Code:SM-B-01Y) BI-RADS CATEGORY  1: Negative. Electronically Signed   By: Sande Brothers M.D.   On: 05/28/2022 09:26       Assessment & Plan:  Hypercholesterolemia Assessment & Plan: Low carb diet and exercise.  Has lost weight. On crestor.  Follow lipid panel and liver function tests.    Orders: -     Lipid panel; Future -     Hepatic function panel; Future -     Basic metabolic panel; Future -     CBC with Differential/Platelet; Future  Type 2 diabetes mellitus with hyperglycemia, without long-term current use of insulin (HCC) Assessment & Plan: On metformin and ozempic. Tolerating.  Doing well. Has lost weight.  Low carb diet and exercise.  Follow met b and a1c.    Orders: -     Hemoglobin A1c; Future  Abnormal liver function tests Assessment & Plan: Previous ultrasound revealed fatty liver.  Has adjusted diet/lost weight. Continue low carb diet, exercise and weight loss.  Follow liver function tests. Reports no increased alcohol intake.    History of colonic  polyps Assessment & Plan: Just had colonoscopy 01/2021 - Dr Servando Snare - diverticulosis and non bleeding internal hemorrhoids.  Recommended f/u colonoscopy in 7 years.     Primary hypertension Assessment & Plan: Continue hctz, amlodipine and lisinopril.  Follow pressures.  Follow metabolic panel.    Other orders -     amLODIPine Besylate; TAKE 1 TABLET BY MOUTH DAILY.  Dispense: 90 tablet; Refill: 1 -     hydroCHLOROthiazide; Take 1 tablet (25 mg total) by mouth daily.  Dispense: 90 tablet; Refill: 1 -     Lisinopril; TAKE 1 TABLET BY MOUTH DAILY.  Dispense: 90 tablet; Refill: 1 -     metFORMIN HCl ER; Take 1 tablet (500 mg total) by mouth 2 (two) times daily.  Dispense: 180 tablet; Refill: 1 -     PARoxetine HCl; Take 1 tablet (10 mg total) by mouth every morning.  Dispense: 90 tablet; Refill: 1 -     Rosuvastatin Calcium; Take 1 tablet (10 mg total) by mouth daily.  Dispense: 90 tablet; Refill:  1 -     Ozempic (1 MG/DOSE); 1 mg under the skin weekly  Dispense: 3 mL; Refill: 3     Dale Smoot, MD

## 2022-10-31 ENCOUNTER — Other Ambulatory Visit: Payer: Self-pay | Admitting: Oncology

## 2022-10-31 DIAGNOSIS — Z006 Encounter for examination for normal comparison and control in clinical research program: Secondary | ICD-10-CM

## 2022-11-02 ENCOUNTER — Encounter: Payer: Self-pay | Admitting: Internal Medicine

## 2022-11-02 NOTE — Assessment & Plan Note (Signed)
Continue hctz, amlodipine and lisinopril.  Follow pressures.  Follow metabolic panel.  

## 2022-11-02 NOTE — Assessment & Plan Note (Signed)
On metformin and ozempic. Tolerating.  Doing well. Has lost weight.  Low carb diet and exercise.  Follow met b and a1c.

## 2022-11-02 NOTE — Assessment & Plan Note (Signed)
Previous ultrasound revealed fatty liver.  Has adjusted diet/lost weight. Continue low carb diet, exercise and weight loss.  Follow liver function tests. Reports no increased alcohol intake.

## 2022-11-02 NOTE — Assessment & Plan Note (Signed)
Just had colonoscopy 01/2021 - Dr Wohl - diverticulosis and non bleeding internal hemorrhoids.  Recommended f/u colonoscopy in 7 years.   

## 2022-11-02 NOTE — Assessment & Plan Note (Signed)
Low carb diet and exercise.  Has lost weight. On crestor.  Follow lipid panel and liver function tests.   

## 2022-11-06 ENCOUNTER — Other Ambulatory Visit
Admission: RE | Admit: 2022-11-06 | Discharge: 2022-11-06 | Disposition: A | Discharge status: Home / Self Care | Payer: Medicare Other | Attending: Oncology | Admitting: Oncology

## 2022-11-06 DIAGNOSIS — Z006 Encounter for examination for normal comparison and control in clinical research program: Secondary | ICD-10-CM | POA: Insufficient documentation

## 2022-11-21 ENCOUNTER — Encounter: Payer: Medicare Other | Admitting: Internal Medicine

## 2022-11-24 DIAGNOSIS — K080 Exfoliation of teeth due to systemic causes: Secondary | ICD-10-CM | POA: Diagnosis not present

## 2022-11-27 ENCOUNTER — Encounter: Payer: Self-pay | Admitting: Internal Medicine

## 2022-11-28 NOTE — Telephone Encounter (Signed)
Called patient to schedule her appt with Dr Lorin Picket for breast exam. When she returns call, please schedule her for whatever Dr Lorin Picket has available on the days she listed below.

## 2022-12-16 ENCOUNTER — Encounter: Payer: Medicare Other | Admitting: Internal Medicine

## 2022-12-17 ENCOUNTER — Ambulatory Visit: Payer: Self-pay | Admitting: Surgery

## 2022-12-22 ENCOUNTER — Ambulatory Visit: Payer: Medicare Other | Admitting: Internal Medicine

## 2022-12-22 ENCOUNTER — Encounter: Payer: Self-pay | Admitting: Internal Medicine

## 2022-12-22 VITALS — BP 126/80 | HR 97 | Temp 97.8°F | Ht 61.0 in | Wt 174.0 lb

## 2022-12-22 DIAGNOSIS — R7989 Other specified abnormal findings of blood chemistry: Secondary | ICD-10-CM

## 2022-12-22 DIAGNOSIS — Z7984 Long term (current) use of oral hypoglycemic drugs: Secondary | ICD-10-CM

## 2022-12-22 DIAGNOSIS — Z0000 Encounter for general adult medical examination without abnormal findings: Secondary | ICD-10-CM

## 2022-12-22 DIAGNOSIS — Z136 Encounter for screening for cardiovascular disorders: Secondary | ICD-10-CM

## 2022-12-22 DIAGNOSIS — Z86010 Personal history of colonic polyps: Secondary | ICD-10-CM | POA: Diagnosis not present

## 2022-12-22 DIAGNOSIS — I10 Essential (primary) hypertension: Secondary | ICD-10-CM

## 2022-12-22 DIAGNOSIS — E7800 Pure hypercholesterolemia, unspecified: Secondary | ICD-10-CM | POA: Diagnosis not present

## 2022-12-22 DIAGNOSIS — F439 Reaction to severe stress, unspecified: Secondary | ICD-10-CM

## 2022-12-22 DIAGNOSIS — E1165 Type 2 diabetes mellitus with hyperglycemia: Secondary | ICD-10-CM

## 2022-12-22 DIAGNOSIS — N6320 Unspecified lump in the left breast, unspecified quadrant: Secondary | ICD-10-CM

## 2022-12-22 NOTE — Progress Notes (Signed)
[name], female   DOB: [birth year], 67 y.o.   MRN: [id number] The patient is here for Welcome to Medicare visit-  Medicare  preventive visit     has a past medical history of Abnormal liver function tests, Allergy, Anemia, Arthritis, Depression, Diabetes mellitus without complication (HCC), Fatty liver, Hypercholesterolemia, Hypertension, Migraine headache, Neuromuscular disorder (HCC), Recurrent sinusitis, and Tuberculosis (1980'S).   She has not history of tobacco use or drug use.   The roster of all physicians providing medical care to patient : Patient Care Team: Dale [place], MD as PCP - General (Internal Medicine)  Activities of daily living:  The patient is 100% independent in all ADLs: dressing, toileting, feeding as well as independent mobility Fall risk was assessed by direct patient evaluation of patient's balance, gait and ability to risk from a chair.  Home safety : The patient has smoke detectors in the home. They wear seatbelts.  Firearms are locked away. There is no violence in the home.   She is seen by an ophthalmologist.   Visual acuity was assessed today  and was 20/25.  Patient denies hearing difficulty with regard to whispered voices.   There is no risks for hepatitis, STDs or HIV. There is no   history of blood transfusion. She has no travel history to infectious disease endemic areas of the world.  She sees the dentist regularly. She does not  have excessive sun exposure. Discussed the need for sun protection: hats, long sleeves and use of sunscreen if there is significant sun exposure.   Diet: the importance of a healthy diet is discussed. She does try to watch her diet.   She is exercising.  Walking.    Depression screen:      12/22/2022    2:15 PM 06/10/2022   11:31 AM 01/07/2022    9:07 AM 09/05/2021    9:07 AM 12/31/2020   10:12 AM  Depression screen PHQ 2/9  Decreased Interest 0 0 0 0 0  Down, Depressed, Hopeless 0 0 0 0 0  PHQ - 2  Score 0 0 0 0 0  Altered sleeping 0      Tired, decreased energy 0      Change in appetite 0      Feeling bad or failure about yourself  0      Trouble concentrating 0      Moving slowly or fidgety/restless 0      Suicidal thoughts 0      PHQ-9 Score 0      Difficult doing work/chores Not difficult at all           Cognitive assessment: the patient manages all their financial and personal affairs and is actively engaged. They could relate day,date,year and events; recalled 3/3 objects at 3 minutes.   The following portions of the patient's history were reviewed and updated as appropriate: allergies, current medications, past family history, past medical history,  past surgical history, past social history  and problem list.  During the course of the visit the patient was educated and counseled about appropriate screening and preventive services including : fall prevention , diabetes screening, nutrition counseling, colorectal cancer screening, and recommended immunizations.  Has had pneumonia vaccine.  Up to date with tetanus.  Has had shingles vaccine.  Had flu vaccine for this year. Colonoscopy 01/2021 - recommended f/u in 7 years. Mammogram 05/26/22 - Birads I.    Immunization History  Administered Date(s) Administered   Fluad Quad(high Dose  65+) 12/25/2021   Influenza Split 12/27/2012, 12/13/2013   Influenza-Unspecified 12/29/2012, 01/02/2015, 12/19/2015, 12/09/2016, 12/28/2017, 12/29/2018, 12/19/2019, 12/26/2020, 12/09/2022   PFIZER(Purple Top)SARS-COV-2 Vaccination 03/28/2019, 04/18/2019, 01/13/2020, 07/27/2020   PNEUMOCOCCAL CONJUGATE-20 09/05/2021   Pfizer(Comirnaty)Fall Seasonal Vaccine 12 years and older 01/31/2022   Pneumococcal Polysaccharide-23 05/17/2019   Td 12/24/2015   Zoster Recombinant(Shingrix) 03/05/2018, 10/11/2018  HMLISTPATIENTFRIENDLY@ Health Maintenance Due  Topic Date Due   COVID-19 Vaccine (6 - 2023-24 season) 11/30/2022     Vital Signs: BP 126/80    Pulse 97   Temp 97.8 F (36.6 C) (Oral)   Ht 5\' 1"  (1.549 m)   Wt 174 lb (78.9 kg)   LMP 05/30/2007   SpO2 98%   BMI 32.88 kg/m    Exam: General appearance: alert, cooperative and appears stated age Head: Normocephalic, without obvious abnormality, atraumatic Eyes: conjunctivae/corneas clear.  EOM's intact. Throat: lips, mucosa, and tongue normal; teeth and gums normal Neck: no adenopathy, no carotid bruit, no JVD, supple, symmetrical, trachea midline and thyroid not enlarged, symmetric, no tenderness/mass/nodules Lungs: clear to auscultation bilaterally Breasts: palpable firm area adjacent to nipple. Nipple inverted.  No other palpable nodule nodule.  No axillary adenopathy.   Heart: regular rate and rhythm, S1, S2 normal, no murmur, click, rub or gallop Abdomen: soft, non-tender; bowel sounds normal; no masses,  no organomegaly Extremities: extremities normal, atraumatic, no cyanosis or edema Pulses: 2+ and symmetric Skin: Skin color, texture, turgor normal. No rashes or lesions Neurologic: Alert and oriented X 3, normal strength and tone. Normal symmetric reflexes. Normal coordination and gait.     End of Life Discussion and Planning   During the course of the visit , End of Life objectives were discussed.  Discussed living will and health care power of attorney.    Reviewed medications and reconciled meds.

## 2022-12-26 ENCOUNTER — Encounter: Payer: Self-pay | Admitting: Internal Medicine

## 2022-12-26 DIAGNOSIS — Z136 Encounter for screening for cardiovascular disorders: Secondary | ICD-10-CM | POA: Insufficient documentation

## 2022-12-26 DIAGNOSIS — N63 Unspecified lump in unspecified breast: Secondary | ICD-10-CM | POA: Insufficient documentation

## 2022-12-26 NOTE — Assessment & Plan Note (Signed)
On metformin and ozempic. Tolerating.  Doing well. Low carb diet and exercise.  Follow met b and a1c.

## 2022-12-26 NOTE — Assessment & Plan Note (Signed)
Overall appears to be handling things relatively well.  Follow.   

## 2022-12-26 NOTE — Assessment & Plan Note (Signed)
Just had colonoscopy 01/2021 - Dr Wohl - diverticulosis and non bleeding internal hemorrhoids.  Recommended f/u colonoscopy in 7 years.   

## 2022-12-26 NOTE — Assessment & Plan Note (Signed)
Previous ultrasound revealed fatty liver.  Has adjusted diet/lost weight. Continue low carb diet, exercise and weight loss.  Follow liver function tests. Reports no increased alcohol intake.

## 2022-12-26 NOTE — Assessment & Plan Note (Addendum)
Low carb diet and exercise.  On crestor.  Follow lipid panel and liver function tests.

## 2022-12-26 NOTE — Assessment & Plan Note (Signed)
Palpable area as outlined.  Previous biopsy same area (Dr Lemar Livings). Discussed mammogram/ultrasound.  Last 05/2022 - Birads I.  She wants to hold on imaging.  Has scheduled an appt with surgery for further evaluation.

## 2022-12-26 NOTE — Assessment & Plan Note (Signed)
EKG - SR with no acute ischemic changes.

## 2022-12-26 NOTE — Assessment & Plan Note (Signed)
Continue hctz, amlodipine and lisinopril.  Follow pressures.  Follow metabolic panel.  

## 2022-12-31 ENCOUNTER — Other Ambulatory Visit: Payer: Self-pay | Admitting: Surgery

## 2022-12-31 ENCOUNTER — Ambulatory Visit (INDEPENDENT_AMBULATORY_CARE_PROVIDER_SITE_OTHER): Payer: Medicare Other | Admitting: Surgery

## 2022-12-31 ENCOUNTER — Encounter: Payer: Self-pay | Admitting: Surgery

## 2022-12-31 VITALS — BP 120/77 | HR 89 | Temp 98.7°F | Ht 62.0 in | Wt 173.8 lb

## 2022-12-31 DIAGNOSIS — N6320 Unspecified lump in the left breast, unspecified quadrant: Secondary | ICD-10-CM | POA: Diagnosis not present

## 2022-12-31 NOTE — Progress Notes (Unsigned)
12/31/2022  Reason for Visit:  Left breast palpable mass  History of Present Illness: Kathleen Yu is a 67 y.o. female presenting for evaluation of a palpable left breast mass.  The patient has a prior history of left breast retroareolar lumpectomy with Dr. Lemar Livings in 2019, which showed focal atypical lobular hyperplasia, PASH, and fat necrosis.  She had been doing well until two months ago, when she felt a mass just deep to the medial periareolar incision.  She reports that she was helping move her mother to her house and also that her dog loves to be on her chest area, and then the felt the lump.  She had not noticed it before then.  Denies any worsening pain or nipple retraction more than her baseline after her surgery, and denies any drainage.  She had her last mammogram on 05/26/22 which was negative for any suspicious findings.  Past Medical History: Past Medical History:  Diagnosis Date   Abnormal liver function tests    Allergy    Anemia    H/O   Arthritis    RA   Depression    Diabetes mellitus without complication (HCC)    Fatty liver    Hypercholesterolemia    Hypertension    Migraine headache    better since menopause   Neuromuscular disorder (HCC)    Recurrent sinusitis    allergies   Tuberculosis 1980'S   + TB TEST-WAS TREATED     Past Surgical History: Past Surgical History:  Procedure Laterality Date   BILATERAL CARPAL TUNNEL RELEASE     BREAST BIOPSY Left 05/12/2017   FRAGMENTS WITH FEATURES OF AN EARLY INTRADUCTAL PAPILLOMA WITH aprocine changes   BREAST BIOPSY Left 06/12/2017   Procedure: BREAST BIOPSY;  Surgeon: Earline Mayotte, MD;  Location: [facility];  Service: General;  Laterality: Left;   BREAST DUCTAL SYSTEM EXCISION Left 06/12/2017   Procedure: EXCISION DUCTAL SYSTEM BREAST;  Surgeon: Earline Mayotte, MD;  Location: [facility];  Service: General;  Laterality: Left;   BREAST EXCISIONAL BIOPSY Left 2004   x 2   COLONOSCOPY WITH PROPOFOL N/A  02/11/2016   Procedure: COLONOSCOPY WITH PROPOFOL;  Surgeon: Midge Minium, MD;  Location: [facility];  Service: Endoscopy;  Laterality: N/A;   COLONOSCOPY WITH PROPOFOL N/A 02/18/2021   Procedure: COLONOSCOPY WITH PROPOFOL;  Surgeon: Midge Minium, MD;  Location: [facility];  Service: Endoscopy;  Laterality: N/A;   EXCISION MORTON'S NEUROMA Left 06/01/2015   Procedure: EXCISION MORTON'S NEUROMA;  Surgeon: Linus Galas, DPM;  Location: [facility];  Service: Podiatry;  Laterality: Left;  Local w/ mac   FRACTURE SURGERY  2017   OSTECTOMY Left 06/01/2015   Procedure: OSTECTOMY/ part. excision fracture fragment left 4th toe;  Surgeon: Linus Galas, DPM;  Location: [facility];  Service: Podiatry;  Laterality: Left;   POLYPECTOMY N/A 02/11/2016   Procedure: POLYPECTOMY;  Surgeon: Midge Minium, MD;  Location: [facility];  Service: Endoscopy;  Laterality: N/A;   RHINOPLASTY     TUBAL LIGATION  1997    Home Medications: Prior to Admission medications   Medication Sig Start Date End Date Taking? Authorizing Provider  acetaminophen (TYLENOL) 500 MG tablet Take 1,000 mg by mouth daily as needed for moderate pain.    Yes [provider]  amLODipine (NORVASC) 10 MG tablet TAKE 1 TABLET BY MOUTH DAILY. 10/27/22 10/27/23 Yes Dale [place], MD  fexofenadine-pseudoephedrine (ALLEGRA-D) 60-120 MG 12 hr tablet Take 1 tablet by mouth daily as needed (  allergies).    Yes [provider]  hydrochlorothiazide (HYDRODIURIL) 25 MG tablet Take 1 tablet (25 mg total) by mouth daily. 10/27/22 10/27/23 Yes Dale [place], MD  ibuprofen (ADVIL,MOTRIN) 200 MG tablet Take 600-800 mg by mouth daily as needed (arthritis).    Yes [provider]  lisinopril (ZESTRIL) 40 MG tablet TAKE 1 TABLET BY MOUTH DAILY. 10/27/22 10/27/23 Yes Dale [place], MD  loratadine (CLARITIN) 10 MG tablet Take 10 mg by mouth daily.   Yes [provider]  magnesium oxide (MAG-OX) 400 MG tablet Take 1  tablet (400 mg total) by mouth daily. Patient taking differently: Take 400 mg by mouth 2 (two) times daily. 07/27/19  Yes Dale [place], MD  metFORMIN (GLUCOPHAGE-XR) 500 MG 24 hr tablet Take 1 tablet (500 mg total) by mouth 2 (two) times daily. 10/27/22 04/25/23 Yes Dale [place], MD  naproxen sodium (ANAPROX) 220 MG tablet Take 440 mg by mouth daily as needed (pain).    Yes [provider]  PARoxetine (PAXIL) 10 MG tablet Take 1 tablet (10 mg total) by mouth every morning. 10/27/22 10/27/23 Yes Dale [place], MD  rosuvastatin (CRESTOR) 10 MG tablet Take 1 tablet (10 mg total) by mouth daily. 10/27/22 10/27/23 Yes Dale [place], MD  Semaglutide, 1 MG/DOSE, (OZEMPIC, 1 MG/DOSE,) 4 MG/3ML SOPN 1 mg under the skin weekly 10/27/22  Yes Dale [place], MD    Allergies: No Known Allergies  Social History:  reports that she has never smoked. She has never used smokeless tobacco. She reports current alcohol use of about 1.0 standard drink of alcohol per week. She reports that she does not use drugs.   Family History: Family History  Problem Relation Age of Onset   Heart disease Father        died age 67 (MI)   Heart disease Other        4 uncles - CABG   Colon cancer Other        great aunt   Heart disease Paternal Grandmother        first MI - 62s   Migraines Mother    Breast cancer Mother 51   Hypertension Mother    Hypertension Maternal Grandmother    Early death Maternal Grandmother    Diabetes Paternal Grandfather    Stroke Paternal Grandfather    Depression Maternal Aunt    Breast cancer Other        1 mat and 1 pat  gr aunt    Review of Systems: Review of Systems  Constitutional:  Negative for chills and fever.  Respiratory:  Negative for shortness of breath.   Cardiovascular:  Negative for chest pain.  Gastrointestinal:  Negative for nausea and vomiting.  Skin:  Negative for rash.       Left breast mass    Physical Exam BP 120/77 (BP Location: Left Arm,  Patient Position: Sitting, Cuff Size: Small)   Pulse 89   Temp 98.7 F (37.1 C) (Oral)   Ht 5\' 2"  (1.575 m)   Wt 173 lb 12.8 oz (78.8 kg)   LMP 05/30/2007   SpO2 95%   BMI 31.79 kg/m  CONSTITUTIONAL: No acute distress, well nourished. HEENT:  Normocephalic, atraumatic, extraocular motion intact.  RESPIRATORY:  Normal respiratory effort without pathologic use of accessory muscles. CARDIOVASCULAR: Regular rhythm and rate. BREAST:  Right breast without any palpable masses, skin changes, or nipple changes.  No right axillary lymphadenopathy.  Left breast with a retracted nipple at baseline, with medial periareolar incision  well healed.  Just deep to the central portion of the incision is a 1.5 cm mass that is firm, without fluctuance, erythema or induration.  It is somewhat mobile. MUSCULOSKELETAL:  Normal gait.  No peripheral edema or cyanosis. NEUROLOGIC:  Motor and sensation is grossly normal.  Cranial nerves are grossly intact. PSYCH:  Alert and oriented to person, place and time. Affect is normal.  Laboratory Analysis: Labs from 10/22/22: Sodium 138, potassium 3.8, chloride 101, CO2 27, BUN 15, creatinine 1.02.  LFTs within normal limits.  Hemoglobin A1c 5.8.  Imaging: Mammogram on 05/26/22: FINDINGS: There are no findings suspicious for malignancy.   IMPRESSION: No mammographic evidence of malignancy. A result letter of this screening mammogram will be mailed directly to the patient.   RECOMMENDATION: Screening mammogram in one year. (Code:SM-B-01Y)   BI-RADS CATEGORY  1: Negative.  Assessment and Plan: This is a 67 y.o. female with a new left breast palpable mass.  --Discussed with the patient the findings on exam.  She reports this mass is new and started after her last mammogram in February.  As such, I think it'll be important to obtain a new diagnostic mammogram and ultrasound of the left breast to evaluate this area.  This does not feel like fluid and as such would not  benefit from aspiration attempt.  Discussed with the patient that based on imaging, a biopsy may be recommended vs follow up with imaging in near future.   --For now, no evidence of infection or fluctuance.  No antibiotics are needed. --I will contact the patient with the results of her mammogram and ultrasound and with further plans based on the results.  I spent 30 minutes dedicated to the care of this patient on the date of this encounter to include pre-visit review of records, face-to-face time with the patient discussing diagnosis and management, and any post-visit coordination of care.   Howie Ill, MD [facility]

## 2022-12-31 NOTE — Patient Instructions (Addendum)
[facility]:  [phone]  Breast Self-Awareness Breast self-awareness is knowing how your breasts look and feel. You need to: Check your breasts on a regular basis. Tell your doctor about any changes. Become familiar with the look and feel of your breasts. This can help you catch a breast problem while it is still small and can be treated. You should do breast self-exams even if you have breast implants. What you need: A mirror. A well-lit room. A pillow or other soft object. How to do a breast self-exam Follow these steps to do a breast self-exam: Look for changes  Take off all the clothes above your waist. Stand in front of a mirror in a room with good lighting. Put your hands down at your sides. Compare your breasts in the mirror. Look for any difference between them, such as: A difference in shape. A difference in size. Wrinkles, dips, and bumps in one breast and not the other. Look at each breast for changes in the skin, such as: Redness. Scaly areas. Skin that has gotten thicker. Dimpling. Open sores (ulcers). Look for changes in your nipples, such as: Fluid coming out of a nipple. Fluid around a nipple. Bleeding. Dimpling. Redness. A nipple that looks pushed in (retracted), or that has changed position. Feel for changes Lie on your back. Feel each breast. To do this: Pick a breast to feel. Place a pillow under the shoulder closest to that breast. Put the arm closest to that breast behind your head. Feel the nipple area of that breast using the hand of your other arm. Feel the area with the pads of your three middle fingers by making small circles with your fingers. Use light, medium, and firm pressure. Continue the overlapping circles, moving downward over the breast. Keep making circles with your fingers. Stop when you feel your ribs. Start making circles with your fingers again, this time going upward until you reach your collarbone. Then, make  circles outward across your breast and into your armpit area. Squeeze your nipple. Check for discharge and lumps. Repeat these steps to check your other breast. Sit or stand in the tub or shower. With soapy water on your skin, feel each breast the same way you did when you were lying down. Write down what you find Writing down what you find can help you remember what to tell your doctor. Write down: What is normal for each breast. Any changes you find in each breast. These include: The kind of changes you find. A tender or painful breast. Any lump you find. Write down its size and where it is. When you last had your monthly period (menstrual cycle). General tips If you are breastfeeding, the best time to check your breasts is after you feed your baby or after you use a breast pump. If you get monthly bleeding, the best time to check your breasts is 5-7 days after your monthly cycle ends. With time, you will become comfortable with the self-exam. You will also start to know if there are changes in your breasts. Contact a doctor if: You see a change in the shape or size of your breasts or nipples. You see a change in the skin of your breast or nipples, such as red or scaly skin. You have fluid coming from your nipples that is not normal. You find a new lump or thick area. You have breast pain. You have any concerns about your breast health. Summary Breast self-awareness includes looking for changes  in your breasts and feeling for changes within your breasts. You should do breast self-awareness in front of a mirror in a well-lit room. If you get monthly periods (menstrual cycles), the best time to check your breasts is 5-7 days after your period ends. Tell your doctor about any changes you see in your breasts. Changes include changes in size, changes on the skin, painful or tender breasts, or fluid from your nipples that is not normal. This information is not intended to replace advice  given to you by your health care provider. Make sure you discuss any questions you have with your health care provider. Document Revised: 08/22/2021 Document Reviewed: 01/17/2021 Elsevier Patient Education  2024 [employer].

## 2023-01-14 ENCOUNTER — Ambulatory Visit
Admission: RE | Admit: 2023-01-14 | Discharge: 2023-01-14 | Disposition: A | Discharge status: Home / Self Care | Payer: Medicare Other | Source: Ambulatory Visit | Attending: Surgery | Admitting: Surgery

## 2023-01-14 DIAGNOSIS — N6342 Unspecified lump in left breast, subareolar: Secondary | ICD-10-CM | POA: Insufficient documentation

## 2023-01-14 DIAGNOSIS — N6320 Unspecified lump in the left breast, unspecified quadrant: Secondary | ICD-10-CM

## 2023-01-14 DIAGNOSIS — Z9889 Other specified postprocedural states: Secondary | ICD-10-CM | POA: Diagnosis not present

## 2023-01-14 DIAGNOSIS — D369 Benign neoplasm, unspecified site: Secondary | ICD-10-CM | POA: Diagnosis not present

## 2023-01-14 DIAGNOSIS — N6322 Unspecified lump in the left breast, upper inner quadrant: Secondary | ICD-10-CM | POA: Diagnosis not present

## 2023-01-14 DIAGNOSIS — R921 Mammographic calcification found on diagnostic imaging of breast: Secondary | ICD-10-CM | POA: Diagnosis not present

## 2023-01-26 DIAGNOSIS — H2513 Age-related nuclear cataract, bilateral: Secondary | ICD-10-CM | POA: Diagnosis not present

## 2023-01-26 DIAGNOSIS — E119 Type 2 diabetes mellitus without complications: Secondary | ICD-10-CM | POA: Diagnosis not present

## 2023-01-26 DIAGNOSIS — H43812 Vitreous degeneration, left eye: Secondary | ICD-10-CM | POA: Diagnosis not present

## 2023-01-26 LAB — HM DIABETES EYE EXAM

## 2023-01-27 ENCOUNTER — Encounter: Payer: Self-pay | Admitting: Internal Medicine

## 2023-03-05 ENCOUNTER — Other Ambulatory Visit: Payer: Medicare Other

## 2023-03-05 DIAGNOSIS — E1165 Type 2 diabetes mellitus with hyperglycemia: Secondary | ICD-10-CM

## 2023-03-05 DIAGNOSIS — E7800 Pure hypercholesterolemia, unspecified: Secondary | ICD-10-CM | POA: Diagnosis not present

## 2023-03-05 LAB — HEPATIC FUNCTION PANEL
ALT: 35 U/L (ref 0–35)
AST: 23 U/L (ref 0–37)
Albumin: 4.6 g/dL (ref 3.5–5.2)
Alkaline Phosphatase: 57 U/L (ref 39–117)
Bilirubin, Direct: 0.1 mg/dL (ref 0.0–0.3)
Total Bilirubin: 0.7 mg/dL (ref 0.2–1.2)
Total Protein: 7.1 g/dL (ref 6.0–8.3)

## 2023-03-05 LAB — BASIC METABOLIC PANEL
BUN: 15 mg/dL (ref 6–23)
CO2: 30 meq/L (ref 19–32)
Calcium: 10.1 mg/dL (ref 8.4–10.5)
Chloride: 102 meq/L (ref 96–112)
Creatinine, Ser: 0.78 mg/dL (ref 0.40–1.20)
GFR: 79.05 mL/min (ref >60.00)
Glucose, Bld: 92 mg/dL (ref 70–99)
Potassium: 4 meq/L (ref 3.5–5.1)
Sodium: 139 meq/L (ref 135–145)

## 2023-03-05 LAB — LIPID PANEL
Cholesterol: 150 mg/dL (ref 0–200)
HDL: 41.2 mg/dL (ref >39.00)
LDL Cholesterol: 75 mg/dL (ref 0–99)
NonHDL: 108.84
Total CHOL/HDL Ratio: 4
Triglycerides: 171 mg/dL — ABNORMAL HIGH (ref 0.0–149.0)
VLDL: 34.2 mg/dL (ref 0.0–40.0)

## 2023-03-05 LAB — CBC WITH DIFFERENTIAL/PLATELET
Basophils Absolute: 0 10*3/uL (ref 0.0–0.1)
Basophils Relative: 0.7 % (ref 0.0–3.0)
Eosinophils Absolute: 0.2 10*3/uL (ref 0.0–0.7)
Eosinophils Relative: 3.1 % (ref 0.0–5.0)
HCT: 39.6 % (ref 36.0–46.0)
Hemoglobin: 14 g/dL (ref 12.0–15.0)
Lymphocytes Relative: 34.3 % (ref 12.0–46.0)
Lymphs Abs: 2.3 10*3/uL (ref 0.7–4.0)
MCHC: 35.3 g/dL (ref 30.0–36.0)
MCV: 86.5 fL (ref 78.0–100.0)
Monocytes Absolute: 0.6 10*3/uL (ref 0.1–1.0)
Monocytes Relative: 8.3 % (ref 3.0–12.0)
Neutro Abs: 3.7 10*3/uL (ref 1.4–7.7)
Neutrophils Relative %: 53.6 % (ref 43.0–77.0)
Platelets: 265 10*3/uL (ref 150.0–400.0)
RBC: 4.58 Mil/uL (ref 3.87–5.11)
RDW: 13.6 % (ref 11.5–15.5)
WBC: 6.9 10*3/uL (ref 4.0–10.5)

## 2023-03-05 LAB — HEMOGLOBIN A1C: Hgb A1c MFr Bld: 6 % (ref 4.6–6.5)

## 2023-03-09 ENCOUNTER — Ambulatory Visit: Payer: Medicare Other | Admitting: Internal Medicine

## 2023-03-09 ENCOUNTER — Encounter: Payer: Self-pay | Admitting: Internal Medicine

## 2023-03-09 VITALS — BP 110/68 | HR 89 | Temp 98.3°F | Resp 16 | Ht 62.0 in | Wt 176.0 lb

## 2023-03-09 DIAGNOSIS — Z1231 Encounter for screening mammogram for malignant neoplasm of breast: Secondary | ICD-10-CM | POA: Diagnosis not present

## 2023-03-09 DIAGNOSIS — Z7984 Long term (current) use of oral hypoglycemic drugs: Secondary | ICD-10-CM

## 2023-03-09 DIAGNOSIS — E7800 Pure hypercholesterolemia, unspecified: Secondary | ICD-10-CM | POA: Diagnosis not present

## 2023-03-09 DIAGNOSIS — E1165 Type 2 diabetes mellitus with hyperglycemia: Secondary | ICD-10-CM | POA: Diagnosis not present

## 2023-03-09 DIAGNOSIS — Z0000 Encounter for general adult medical examination without abnormal findings: Secondary | ICD-10-CM | POA: Diagnosis not present

## 2023-03-09 DIAGNOSIS — I10 Essential (primary) hypertension: Secondary | ICD-10-CM | POA: Diagnosis not present

## 2023-03-09 DIAGNOSIS — Z860100 Personal history of colon polyps, unspecified: Secondary | ICD-10-CM

## 2023-03-09 DIAGNOSIS — F439 Reaction to severe stress, unspecified: Secondary | ICD-10-CM

## 2023-03-09 DIAGNOSIS — Z7985 Long-term (current) use of injectable non-insulin antidiabetic drugs: Secondary | ICD-10-CM

## 2023-03-09 NOTE — Assessment & Plan Note (Addendum)
On metformin and ozempic. Tolerating.  Doing well. Low carb diet and exercise.  Follow met b and a1c.  Recent A1c 6.0.

## 2023-03-09 NOTE — Assessment & Plan Note (Signed)
Continue hctz, amlodipine and lisinopril.  Follow pressures.  Follow metabolic panel.  

## 2023-03-09 NOTE — Progress Notes (Signed)
Subjective:    [name], female    DOB: 1955/11/29, 67 y.o.   MRN: [id number]  Patient here for  Chief Complaint  Patient presents with   Annual Exam    HPI Here for a physical exam. On ozempic and metformin for her diabetes.  Tolerating. Recently evaluated by Dr Aleen Campi for left breast lump. Diagnostic mammogram/ultrasound - birads II.  Recommended f/u screening mammogram in 05/2023. Still with increased stress - taking care of her mother. She feels she is handling things relatively well. Tries to stay active. No chest pain or sob reported. She does report that starting Wednesday (5-6 days ago) - noticed sore throat. Increased nasal congestion - productive - clear mucus. No sinus pressure. Sore throat is better. Some increased drainage. No chest congestion.  Some minimal cough - from drainage. Overall symptoms improved. Does not feel needs any further intervention.    Past Medical History:  Diagnosis Date   Abnormal liver function tests    Allergy    Anemia    H/O   Arthritis    RA   Depression    Diabetes mellitus without complication (HCC)    Fatty liver    Hypercholesterolemia    Hypertension    Migraine headache    better since menopause   Neuromuscular disorder (HCC)    Recurrent sinusitis    allergies   Tuberculosis 1980'S   + TB TEST-WAS TREATED   Past Surgical History:  Procedure Laterality Date   BILATERAL CARPAL TUNNEL RELEASE     BREAST BIOPSY Left 05/12/2017   FRAGMENTS WITH FEATURES OF AN EARLY INTRADUCTAL PAPILLOMA WITH aprocine changes   BREAST BIOPSY Left 06/12/2017   Procedure: BREAST BIOPSY;  Surgeon: Earline Mayotte, MD;  Location: [facility];  Service: General;  Laterality: Left;   BREAST DUCTAL SYSTEM EXCISION Left 06/12/2017   Procedure: EXCISION DUCTAL SYSTEM BREAST;  Surgeon: Earline Mayotte, MD;  Location: [facility];  Service: General;  Laterality: Left;   BREAST EXCISIONAL BIOPSY Left 2004   x 2   COLONOSCOPY WITH PROPOFOL  N/A 02/11/2016   Procedure: COLONOSCOPY WITH PROPOFOL;  Surgeon: Midge Minium, MD;  Location: [facility];  Service: Endoscopy;  Laterality: N/A;   COLONOSCOPY WITH PROPOFOL N/A 02/18/2021   Procedure: COLONOSCOPY WITH PROPOFOL;  Surgeon: Midge Minium, MD;  Location: [facility];  Service: Endoscopy;  Laterality: N/A;   EXCISION MORTON'S NEUROMA Left 06/01/2015   Procedure: EXCISION MORTON'S NEUROMA;  Surgeon: Linus Galas, DPM;  Location: [facility];  Service: Podiatry;  Laterality: Left;  Local w/ mac   FRACTURE SURGERY  2017   OSTECTOMY Left 06/01/2015   Procedure: OSTECTOMY/ part. excision fracture fragment left 4th toe;  Surgeon: Linus Galas, DPM;  Location: [facility];  Service: Podiatry;  Laterality: Left;   POLYPECTOMY N/A 02/11/2016   Procedure: POLYPECTOMY;  Surgeon: Midge Minium, MD;  Location: [facility];  Service: Endoscopy;  Laterality: N/A;   RHINOPLASTY     TUBAL LIGATION  1997   Family History  Problem Relation Age of Onset   Heart disease Father        died age 40 (MI)   Heart disease Other        4 uncles - CABG   Colon cancer Other        great aunt   Heart disease Paternal Grandmother        first MI - 76s   Migraines Mother    Breast cancer Mother  40   Hypertension Mother    Hypertension Maternal Grandmother    Early death Maternal Grandmother    Diabetes Paternal Grandfather    Stroke Paternal Grandfather    Depression Maternal Aunt    Breast cancer Other        1 mat and 1 pat  gr aunt   Social History   Socioeconomic History   Marital status: Married    Spouse name: Not on file   Number of children: 2   Years of education: Not on file   Highest education level: Associate degree: occupational, Scientist, product/process development, or vocational program  Occupational History   Not on file  Tobacco Use   Smoking status: Never   Smokeless tobacco: Never  Vaping Use   Vaping status: Never Used  Substance and Sexual Activity   Alcohol use: Yes    Alcohol/week:  1.0 standard drink of alcohol    Types: 1 Cans of beer per week    Comment: occasional   Drug use: No   Sexual activity: Yes    Birth control/protection: Post-menopausal, Surgical  Other Topics Concern   Not on file  Social History Narrative   Not on file   Social Determinants of Health   Financial Resource Strain: Low Risk  (10/23/2022)   Overall Financial Resource Strain (CARDIA)    Difficulty of Paying Living Expenses: Not very hard  Food Insecurity: No Food Insecurity (10/23/2022)   Hunger Vital Sign    Worried About Running Out of Food in the Last Year: Never true    Ran Out of Food in the Last Year: Never true  Transportation Needs: No Transportation Needs (10/23/2022)   PRAPARE - Administrator, Civil Service (Medical): No    Lack of Transportation (Non-Medical): No  Physical Activity: Insufficiently Active (10/23/2022)   Exercise Vital Sign    Days of Exercise per Week: 2 days    Minutes of Exercise per Session: 30 min  Stress: No Stress Concern Present (10/23/2022)   [employer] of Occupational Health - Occupational Stress Questionnaire    Feeling of Stress : Only a little  Social Connections: Socially Integrated (10/23/2022)   Social Connection and Isolation Panel [NHANES]    Frequency of Communication with Friends and Family: Once a week    Frequency of Social Gatherings with Friends and Family: Twice a week    Attends Religious Services: More than 4 times per year    Active Member of [employer] or Organizations: Yes    Attends Engineer, structural: More than 4 times per year    Marital Status: Married     Review of Systems  Constitutional:  Negative for appetite change and unexpected weight change.  HENT:  Positive for congestion and postnasal drip. Negative for sinus pressure and sore throat.   Eyes:  Negative for pain and visual disturbance.  Respiratory:  Negative for chest tightness and shortness of breath.        Minimal cough as  outlined.   Cardiovascular:  Negative for chest pain, palpitations and leg swelling.  Gastrointestinal:  Negative for abdominal pain, diarrhea, nausea and vomiting.  Genitourinary:  Negative for difficulty urinating and dysuria.  Musculoskeletal:  Negative for joint swelling and myalgias.  Skin:  Negative for color change and rash.  Neurological:  Negative for dizziness and headaches.  Hematological:  Negative for adenopathy. Does not bruise/bleed easily.  Psychiatric/Behavioral:  Negative for agitation and dysphoric mood.        Objective:  BP 110/68   Pulse 89   Temp 98.3 F (36.8 C)   Resp 16   Ht 5\' 2"  (1.575 m)   Wt 176 lb (79.8 kg)   LMP 05/30/2007   SpO2 98%   BMI 32.19 kg/m  Wt Readings from Last 3 Encounters:  03/09/23 176 lb (79.8 kg)  12/31/22 173 lb 12.8 oz (78.8 kg)  12/22/22 174 lb (78.9 kg)    Physical Exam Vitals reviewed.  Constitutional:      General: She is not in acute distress.    Appearance: Normal appearance. She is well-developed.  HENT:     Head: Normocephalic and atraumatic.     Right Ear: External ear normal.     Left Ear: External ear normal.     Nose: Nose normal. No congestion.     Mouth/Throat:     Pharynx: No oropharyngeal exudate or posterior oropharyngeal erythema.  Eyes:     General: No scleral icterus.       Right eye: No discharge.        Left eye: No discharge.     Conjunctiva/sclera: Conjunctivae normal.  Neck:     Thyroid: No thyromegaly.  Cardiovascular:     Rate and Rhythm: Normal rate and regular rhythm.  Pulmonary:     Effort: No tachypnea, accessory muscle usage or respiratory distress.     Breath sounds: Normal breath sounds. No decreased breath sounds or wheezing.  Chest:  Breasts:    Right: No inverted nipple, mass, nipple discharge or tenderness (no axillary adenopathy).     Left: No inverted nipple, mass, nipple discharge or tenderness (no axilarry adenopathy).  Abdominal:     General: Bowel sounds are  normal.     Palpations: Abdomen is soft.     Tenderness: There is no abdominal tenderness.  Musculoskeletal:        General: No swelling or tenderness.     Cervical back: Neck supple. No tenderness.  Lymphadenopathy:     Cervical: No cervical adenopathy.  Skin:    Findings: No erythema or rash.  Neurological:     Mental Status: She is alert and oriented to person, place, and time.  Psychiatric:        Mood and Affect: Mood normal.        Behavior: Behavior normal.      Outpatient Encounter Medications as of 03/09/2023  Medication Sig   acetaminophen (TYLENOL) 500 MG tablet Take 1,000 mg by mouth daily as needed for moderate pain.    amLODipine (NORVASC) 10 MG tablet TAKE 1 TABLET BY MOUTH DAILY.   fexofenadine-pseudoephedrine (ALLEGRA-D) 60-120 MG 12 hr tablet Take 1 tablet by mouth daily as needed (allergies).    hydrochlorothiazide (HYDRODIURIL) 25 MG tablet Take 1 tablet (25 mg total) by mouth daily.   ibuprofen (ADVIL,MOTRIN) 200 MG tablet Take 600-800 mg by mouth daily as needed (arthritis).    lisinopril (ZESTRIL) 40 MG tablet TAKE 1 TABLET BY MOUTH DAILY.   loratadine (CLARITIN) 10 MG tablet Take 10 mg by mouth daily.   magnesium oxide (MAG-OX) 400 MG tablet Take 1 tablet (400 mg total) by mouth daily. (Patient taking differently: Take 400 mg by mouth 2 (two) times daily.)   metFORMIN (GLUCOPHAGE-XR) 500 MG 24 hr tablet Take 1 tablet (500 mg total) by mouth 2 (two) times daily.   naproxen sodium (ANAPROX) 220 MG tablet Take 440 mg by mouth daily as needed (pain).    PARoxetine (PAXIL) 10 MG tablet Take 1 tablet (10  mg total) by mouth every morning.   rosuvastatin (CRESTOR) 10 MG tablet Take 1 tablet (10 mg total) by mouth daily.   Semaglutide, 1 MG/DOSE, (OZEMPIC, 1 MG/DOSE,) 4 MG/3ML SOPN 1 mg under the skin weekly   No facility-administered encounter medications on file as of 03/09/2023.     Lab Results  Component Value Date   WBC 6.9 03/05/2023   HGB 14.0 03/05/2023    HCT 39.6 03/05/2023   PLT 265.0 03/05/2023   GLUCOSE 92 03/05/2023   CHOL 150 03/05/2023   TRIG 171.0 (H) 03/05/2023   HDL 41.20 03/05/2023   LDLDIRECT 130.0 07/11/2019   LDLCALC 75 03/05/2023   ALT 35 03/05/2023   AST 23 03/05/2023   NA 139 03/05/2023   K 4.0 03/05/2023   CL 102 03/05/2023   CREATININE 0.78 03/05/2023   BUN 15 03/05/2023   CO2 30 03/05/2023   TSH 2.79 05/16/2022   HGBA1C 6.0 03/05/2023   MICROALBUR 1.5 05/16/2022    MM 3D DIAGNOSTIC MAMMOGRAM UNILATERAL LEFT BREAST  Result Date: 01/14/2023 CLINICAL DATA:  Patient is status post surgical excision the LEFT retroareolar duct after ultrasound-guided biopsy demonstrated a papilloma. Additional specimen demonstrated predominantly fat necrosis and small focal area of atypical lobular hyperplasia. Patient reports a palpable area in the LEFT upper inner breast, newly conspicuous after moving and new puppy. EXAM: DIGITAL DIAGNOSTIC UNILATERAL LEFT MAMMOGRAM WITH TOMOSYNTHESIS AND CAD; ULTRASOUND LEFT BREAST LIMITED TECHNIQUE: Left digital diagnostic mammography and breast tomosynthesis was performed. The images were evaluated with computer-aided detection. ; Targeted ultrasound examination of the left breast was performed. COMPARISON:  Previous exam(s). ACR Breast Density Category c: The breasts are heterogeneously dense, which may obscure small masses. FINDINGS: Spot compression tomosynthesis views were obtained of the site of palpable concern in the LEFT inner breast. Subjacent to the site of palpable concern, there is a fat containing mass with a thin rim of calcifications. This is consistent with benign postsurgical change and fat necrosis/oil cyst. No suspicious mass, distortion, or microcalcifications are identified to suggest presence of malignancy. On physical exam, there is a superficial mobile mass along the inner retroareolar breast. Targeted ultrasound was performed of the site of palpable concern. No suspicious cystic  or solid mass is seen. Benign postsurgical changes are noted extending from the skin surface to the retroareolar breast. At 9 o'clock RIGHT there is an isoechoic mass with a thin rim of anechoic fluid. This measures 11 x 8 by 8 mm. This is consistent with the oil cyst noted mammographically. IMPRESSION: 1. There are benign postsurgical changes at the site of palpable concern in the LEFT breast. Any further workup of the patient's symptoms should be based on the clinical assessment. 2. No mammographic evidence of malignancy in LEFT breast. RECOMMENDATION: Recommend bilateral annual screening mammogram in February 2025. I have discussed the findings and recommendations with the patient. If applicable, a reminder letter will be sent to the patient regarding the next appointment. BI-RADS CATEGORY  2: Benign. Electronically Signed   By: Meda Klinefelter M.D.   On: 01/14/2023 11:58   [place] LIMITED ULTRASOUND INCLUDING AXILLA LEFT BREAST   Result Date: 01/14/2023 CLINICAL DATA:  Patient is status post surgical excision the LEFT retroareolar duct after ultrasound-guided biopsy demonstrated a papilloma. Additional specimen demonstrated predominantly fat necrosis and small focal area of atypical lobular hyperplasia. Patient reports a palpable area in the LEFT upper inner breast, newly conspicuous after moving and new puppy. EXAM: DIGITAL DIAGNOSTIC UNILATERAL LEFT MAMMOGRAM WITH TOMOSYNTHESIS AND  CAD; ULTRASOUND LEFT BREAST LIMITED TECHNIQUE: Left digital diagnostic mammography and breast tomosynthesis was performed. The images were evaluated with computer-aided detection. ; Targeted ultrasound examination of the left breast was performed. COMPARISON:  Previous exam(s). ACR Breast Density Category c: The breasts are heterogeneously dense, which may obscure small masses. FINDINGS: Spot compression tomosynthesis views were obtained of the site of palpable concern in the LEFT inner breast. Subjacent to the site of palpable  concern, there is a fat containing mass with a thin rim of calcifications. This is consistent with benign postsurgical change and fat necrosis/oil cyst. No suspicious mass, distortion, or microcalcifications are identified to suggest presence of malignancy. On physical exam, there is a superficial mobile mass along the inner retroareolar breast. Targeted ultrasound was performed of the site of palpable concern. No suspicious cystic or solid mass is seen. Benign postsurgical changes are noted extending from the skin surface to the retroareolar breast. At 9 o'clock RIGHT there is an isoechoic mass with a thin rim of anechoic fluid. This measures 11 x 8 by 8 mm. This is consistent with the oil cyst noted mammographically. IMPRESSION: 1. There are benign postsurgical changes at the site of palpable concern in the LEFT breast. Any further workup of the patient's symptoms should be based on the clinical assessment. 2. No mammographic evidence of malignancy in LEFT breast. RECOMMENDATION: Recommend bilateral annual screening mammogram in February 2025. I have discussed the findings and recommendations with the patient. If applicable, a reminder letter will be sent to the patient regarding the next appointment. BI-RADS CATEGORY  2: Benign. Electronically Signed   By: Meda Klinefelter M.D.   On: 01/14/2023 11:58       Assessment & Plan:  Routine general medical examination at a health care facility  Visit for screening mammogram -     3D Screening Mammogram, Left and Right; Future  Hypercholesterolemia Assessment & Plan: Low carb diet and exercise.  On crestor.  Follow lipid panel and liver function tests.    Orders: -     Lipid panel; Future -     Hepatic function panel; Future  Type 2 diabetes mellitus with hyperglycemia, without long-term current use of insulin (HCC) Assessment & Plan: On metformin and ozempic. Tolerating.  Doing well. Low carb diet and exercise.  Follow met b and a1c.  Recent A1c  6.0.   Orders: -     Hemoglobin A1c; Future  Primary hypertension Assessment & Plan: Continue hctz, amlodipine and lisinopril.  Follow pressures.  Follow metabolic panel.   Orders: -     Basic metabolic panel; Future -     TSH; Future  Stress Assessment & Plan: Increased stress as outlined. Overall appears to be handling things relatively well.  Follow.    History of colonic polyps Assessment & Plan: Had colonoscopy 01/2021 - Dr Servando Snare - diverticulosis and non bleeding internal hemorrhoids.  Recommended f/u colonoscopy in 7 years.     Health care maintenance Assessment & Plan: Physical today 03/09/23.  PAP 01/07/22 - negative with negative HPV.  Colonoscopy 01/2021 - Dr Servando Snare - diverticulosis and non bleeding internal hemorrhoids.  Recommended f/u colonoscopy in 7 years.  Mammogram 05/26/22 - Birads I. Recently evaluated by Dr Aleen Campi for left breast lump. Diagnostic mammogram/ultrasound - birads II.  Recommended f/u screening mammogram in 05/2023.  Plan for screening mammogram in 05/2023.       Dale Neilton, MD

## 2023-03-09 NOTE — Assessment & Plan Note (Addendum)
Had colonoscopy 01/2021 - Dr Servando Snare - diverticulosis and non bleeding internal hemorrhoids.  Recommended f/u colonoscopy in 7 years.

## 2023-03-09 NOTE — Assessment & Plan Note (Signed)
Physical today 03/09/23.  PAP 01/07/22 - negative with negative HPV.  Colonoscopy 01/2021 - Dr Servando Snare - diverticulosis and non bleeding internal hemorrhoids.  Recommended f/u colonoscopy in 7 years.  Mammogram 05/26/22 - Birads I. Recently evaluated by Dr Aleen Campi for left breast lump. Diagnostic mammogram/ultrasound - birads II.  Recommended f/u screening mammogram in 05/2023.  Plan for screening mammogram in 05/2023.

## 2023-03-09 NOTE — Assessment & Plan Note (Signed)
Increased stress as outlined.  Overall appears to be handling things relatively well.  Follow.  

## 2023-03-09 NOTE — Assessment & Plan Note (Signed)
Low carb diet and exercise.  On crestor.  Follow lipid panel and liver function tests.

## 2023-04-22 ENCOUNTER — Other Ambulatory Visit: Payer: Self-pay | Admitting: Internal Medicine

## 2023-05-06 ENCOUNTER — Other Ambulatory Visit (HOSPITAL_COMMUNITY): Payer: Self-pay

## 2023-05-07 ENCOUNTER — Telehealth: Payer: Self-pay

## 2023-05-07 ENCOUNTER — Other Ambulatory Visit (HOSPITAL_COMMUNITY): Payer: Self-pay

## 2023-05-07 NOTE — Telephone Encounter (Signed)
 error

## 2023-06-03 ENCOUNTER — Ambulatory Visit
Admission: RE | Admit: 2023-06-03 | Discharge: 2023-06-03 | Disposition: A | Discharge status: Home / Self Care | Payer: Medicare Other | Source: Ambulatory Visit | Attending: Internal Medicine | Admitting: Internal Medicine

## 2023-06-03 DIAGNOSIS — Z1231 Encounter for screening mammogram for malignant neoplasm of breast: Secondary | ICD-10-CM | POA: Diagnosis not present

## 2023-06-03 DIAGNOSIS — K080 Exfoliation of teeth due to systemic causes: Secondary | ICD-10-CM | POA: Diagnosis not present

## 2023-07-04 ENCOUNTER — Other Ambulatory Visit: Payer: Self-pay | Admitting: Internal Medicine

## 2023-07-09 ENCOUNTER — Other Ambulatory Visit: Payer: Medicare Other

## 2023-07-09 DIAGNOSIS — I10 Essential (primary) hypertension: Secondary | ICD-10-CM | POA: Diagnosis not present

## 2023-07-09 DIAGNOSIS — E7800 Pure hypercholesterolemia, unspecified: Secondary | ICD-10-CM | POA: Diagnosis not present

## 2023-07-09 DIAGNOSIS — E1165 Type 2 diabetes mellitus with hyperglycemia: Secondary | ICD-10-CM | POA: Diagnosis not present

## 2023-07-09 LAB — BASIC METABOLIC PANEL WITH GFR
BUN: 19 mg/dL (ref 6–23)
CO2: 27 meq/L (ref 19–32)
Calcium: 9.6 mg/dL (ref 8.4–10.5)
Chloride: 104 meq/L (ref 96–112)
Creatinine, Ser: 0.83 mg/dL (ref 0.40–1.20)
GFR: 73.19 mL/min (ref >60.00)
Glucose, Bld: 126 mg/dL — ABNORMAL HIGH (ref 70–99)
Potassium: 4 meq/L (ref 3.5–5.1)
Sodium: 140 meq/L (ref 135–145)

## 2023-07-09 LAB — HEPATIC FUNCTION PANEL
ALT: 26 U/L (ref 0–35)
AST: 21 U/L (ref 0–37)
Albumin: 4.6 g/dL (ref 3.5–5.2)
Alkaline Phosphatase: 56 U/L (ref 39–117)
Bilirubin, Direct: 0.1 mg/dL (ref 0.0–0.3)
Total Bilirubin: 0.4 mg/dL (ref 0.2–1.2)
Total Protein: 7.2 g/dL (ref 6.0–8.3)

## 2023-07-09 LAB — TSH: TSH: 3.73 u[IU]/mL (ref 0.35–5.50)

## 2023-07-09 LAB — LIPID PANEL
Cholesterol: 158 mg/dL (ref 0–200)
HDL: 41.2 mg/dL (ref >39.00)
LDL Cholesterol: 83 mg/dL (ref 0–99)
NonHDL: 116.97
Total CHOL/HDL Ratio: 4
Triglycerides: 170 mg/dL — ABNORMAL HIGH (ref 0.0–149.0)
VLDL: 34 mg/dL (ref 0.0–40.0)

## 2023-07-09 LAB — HEMOGLOBIN A1C: Hgb A1c MFr Bld: 6.3 % (ref 4.6–6.5)

## 2023-07-13 ENCOUNTER — Ambulatory Visit (INDEPENDENT_AMBULATORY_CARE_PROVIDER_SITE_OTHER): Payer: Medicare Other | Admitting: Internal Medicine

## 2023-07-13 ENCOUNTER — Encounter: Payer: Self-pay | Admitting: Internal Medicine

## 2023-07-13 VITALS — BP 122/72 | HR 84 | Temp 98.3°F | Resp 16 | Ht 62.0 in | Wt 178.0 lb

## 2023-07-13 DIAGNOSIS — L989 Disorder of the skin and subcutaneous tissue, unspecified: Secondary | ICD-10-CM

## 2023-07-13 DIAGNOSIS — I10 Essential (primary) hypertension: Secondary | ICD-10-CM | POA: Diagnosis not present

## 2023-07-13 DIAGNOSIS — Z7984 Long term (current) use of oral hypoglycemic drugs: Secondary | ICD-10-CM

## 2023-07-13 DIAGNOSIS — E1165 Type 2 diabetes mellitus with hyperglycemia: Secondary | ICD-10-CM

## 2023-07-13 DIAGNOSIS — R7989 Other specified abnormal findings of blood chemistry: Secondary | ICD-10-CM

## 2023-07-13 DIAGNOSIS — E7800 Pure hypercholesterolemia, unspecified: Secondary | ICD-10-CM | POA: Diagnosis not present

## 2023-07-13 DIAGNOSIS — F439 Reaction to severe stress, unspecified: Secondary | ICD-10-CM

## 2023-07-13 DIAGNOSIS — Z860100 Personal history of colon polyps, unspecified: Secondary | ICD-10-CM

## 2023-07-13 LAB — HM DIABETES FOOT EXAM

## 2023-07-13 MED ORDER — LISINOPRIL 40 MG PO TABS
40.0000 mg | ORAL_TABLET | Freq: Every day | ORAL | 1 refills | Status: DC
Start: 2023-07-13 — End: 2023-11-17

## 2023-07-13 MED ORDER — AMLODIPINE BESYLATE 10 MG PO TABS: 10.0000 mg | ORAL_TABLET | Freq: Every day | ORAL | 1 refills | Status: DC

## 2023-07-13 MED ORDER — HYDROCHLOROTHIAZIDE 25 MG PO TABS: 25.0000 mg | ORAL_TABLET | Freq: Every day | ORAL | 1 refills | Status: DC

## 2023-07-13 MED ORDER — ROSUVASTATIN CALCIUM 10 MG PO TABS: 10.0000 mg | ORAL_TABLET | Freq: Every day | ORAL | 1 refills | Status: DC

## 2023-07-13 MED ORDER — PAROXETINE HCL 10 MG PO TABS: 10.0000 mg | ORAL_TABLET | Freq: Every day | ORAL | 1 refills | Status: DC

## 2023-07-13 NOTE — Progress Notes (Signed)
 Subjective:    [name], female    DOB: September 13, 1955, 68 y.o.   MRN: [id number]  Patient here for  Chief Complaint  Patient presents with   Medical Management of Chronic Issues    HPI Here for a scheduled follow up - follow up regarding hypercholesterolemia, hypertension and diabetes. Continues on metformin. Off ozempic - due to cost issues. Was covered. Has been under increased stress - caring for her mother. Discussed. Overall she feels she is handling things relatively well. No chest pain or sob reported. No abdominal pain or bowel change reported. Had mammogram 06/08/23 - Birads I. Needs urine microalbumin. Has skin lesion - left side.    Past Medical History:  Diagnosis Date   Abnormal liver function tests    Allergy    Anemia    H/O   Arthritis    RA   Depression    Diabetes mellitus without complication (HCC)    Fatty liver    Hypercholesterolemia    Hypertension    Migraine headache    better since menopause   Neuromuscular disorder (HCC)    Recurrent sinusitis    allergies   Tuberculosis 1980'S   + TB TEST-WAS TREATED   Past Surgical History:  Procedure Laterality Date   BILATERAL CARPAL TUNNEL RELEASE     BREAST BIOPSY Left 05/12/2017   FRAGMENTS WITH FEATURES OF AN EARLY INTRADUCTAL PAPILLOMA WITH aprocine changes   BREAST BIOPSY Left 06/12/2017   Procedure: BREAST BIOPSY;  Surgeon: Earline Mayotte, MD;  Location: [facility];  Service: General;  Laterality: Left;   BREAST DUCTAL SYSTEM EXCISION Left 06/12/2017   Procedure: EXCISION DUCTAL SYSTEM BREAST;  Surgeon: Earline Mayotte, MD;  Location: [facility];  Service: General;  Laterality: Left;   BREAST EXCISIONAL BIOPSY Left 2004   x 2   COLONOSCOPY WITH PROPOFOL N/A 02/11/2016   Procedure: COLONOSCOPY WITH PROPOFOL;  Surgeon: Midge Minium, MD;  Location: [facility];  Service: Endoscopy;  Laterality: N/A;   COLONOSCOPY WITH PROPOFOL N/A 02/18/2021   Procedure: COLONOSCOPY WITH  PROPOFOL;  Surgeon: Midge Minium, MD;  Location: [facility];  Service: Endoscopy;  Laterality: N/A;   EXCISION MORTON'S NEUROMA Left 06/01/2015   Procedure: EXCISION MORTON'S NEUROMA;  Surgeon: Linus Galas, DPM;  Location: [facility];  Service: Podiatry;  Laterality: Left;  Local w/ mac   FRACTURE SURGERY  2017   OSTECTOMY Left 06/01/2015   Procedure: OSTECTOMY/ part. excision fracture fragment left 4th toe;  Surgeon: Linus Galas, DPM;  Location: [facility];  Service: Podiatry;  Laterality: Left;   POLYPECTOMY N/A 02/11/2016   Procedure: POLYPECTOMY;  Surgeon: Midge Minium, MD;  Location: [facility];  Service: Endoscopy;  Laterality: N/A;   RHINOPLASTY     TUBAL LIGATION  1997   Family History  Problem Relation Age of Onset   Heart disease Father        died age 49 (MI)   Heart disease Other        4 uncles - CABG   Colon cancer Other        great aunt   Heart disease Paternal Grandmother        first MI - 40s   Migraines Mother    Breast cancer Mother 30   Hypertension Mother    Hypertension Maternal Grandmother    Early death Maternal Grandmother    Diabetes Paternal Grandfather    Stroke Paternal Grandfather    Depression Maternal Aunt  Breast cancer Other        1 mat and 1 pat  gr aunt   Social History   Socioeconomic History   Marital status: Married    Spouse name: Not on file   Number of children: 2   Years of education: Not on file   Highest education level: Associate degree: occupational, Scientist, product/process development, or vocational program  Occupational History   Not on file  Tobacco Use   Smoking status: Never   Smokeless tobacco: Never  Vaping Use   Vaping status: Never Used  Substance and Sexual Activity   Alcohol use: Yes    Alcohol/week: 1.0 standard drink of alcohol    Types: 1 Cans of beer per week    Comment: occasional   Drug use: No   Sexual activity: Yes    Birth control/protection: Post-menopausal, Surgical  Other Topics Concern   Not on file   Social History Narrative   Not on file   Social Drivers of Health   Financial Resource Strain: Low Risk  (07/10/2023)   Overall Financial Resource Strain (CARDIA)    Difficulty of Paying Living Expenses: Not hard at all  Food Insecurity: No Food Insecurity (07/10/2023)   Hunger Vital Sign    Worried About Running Out of Food in the Last Year: Never true    Ran Out of Food in the Last Year: Never true  Transportation Needs: No Transportation Needs (07/10/2023)   PRAPARE - Administrator, Civil Service (Medical): No    Lack of Transportation (Non-Medical): No  Physical Activity: Insufficiently Active (07/10/2023)   Exercise Vital Sign    Days of Exercise per Week: 4 days    Minutes of Exercise per Session: 10 min  Stress: No Stress Concern Present (07/10/2023)   [employer] of Occupational Health - Occupational Stress Questionnaire    Feeling of Stress : Not at all  Social Connections: Socially Integrated (07/10/2023)   Social Connection and Isolation Panel [NHANES]    Frequency of Communication with Friends and Family: Twice a week    Frequency of Social Gatherings with Friends and Family: Once a week    Attends Religious Services: More than 4 times per year    Active Member of [employer] or Organizations: Yes    Attends Engineer, structural: More than 4 times per year    Marital Status: Married     Review of Systems  Constitutional:  Negative for appetite change and unexpected weight change.  HENT:  Negative for congestion and sinus pressure.   Respiratory:  Negative for cough, chest tightness and shortness of breath.   Cardiovascular:  Negative for chest pain, palpitations and leg swelling.  Gastrointestinal:  Negative for abdominal pain, diarrhea, nausea and vomiting.  Genitourinary:  Negative for difficulty urinating and dysuria.  Musculoskeletal:  Negative for joint swelling and myalgias.  Skin:  Negative for color change and rash.  Neurological:   Negative for dizziness and headaches.  Psychiatric/Behavioral:  Negative for agitation and dysphoric mood.        Increased stress as outlined.        Objective:     BP 122/72   Pulse 84   Temp 98.3 F (36.8 C)   Resp 16   Ht 5\' 2"  (1.575 m)   Wt 178 lb (80.7 kg)   LMP 05/30/2007   SpO2 98%   BMI 32.56 kg/m  Wt Readings from Last 3 Encounters:  07/13/23 178 lb (80.7 kg)  03/09/23 176  lb (79.8 kg)  12/31/22 173 lb 12.8 oz (78.8 kg)    Physical Exam Vitals reviewed.  Constitutional:      General: She is not in acute distress.    Appearance: Normal appearance.  HENT:     Head: Normocephalic and atraumatic.     Right Ear: External ear normal.     Left Ear: External ear normal.     Mouth/Throat:     Pharynx: No oropharyngeal exudate or posterior oropharyngeal erythema.  Eyes:     General: No scleral icterus.       Right eye: No discharge.        Left eye: No discharge.     Conjunctiva/sclera: Conjunctivae normal.  Neck:     Thyroid: No thyromegaly.  Cardiovascular:     Rate and Rhythm: Normal rate and regular rhythm.  Pulmonary:     Effort: No respiratory distress.     Breath sounds: Normal breath sounds. No wheezing.  Abdominal:     General: Bowel sounds are normal.     Palpations: Abdomen is soft.     Tenderness: There is no abdominal tenderness.  Musculoskeletal:        General: No swelling or tenderness.     Cervical back: Neck supple. No tenderness.  Lymphadenopathy:     Cervical: No cervical adenopathy.  Skin:    Findings: No erythema or rash.     Comments: Dry scaling circular lesion - left posterior back/flank   Neurological:     Mental Status: She is alert.  Psychiatric:        Mood and Affect: Mood normal.        Behavior: Behavior normal.      Diabetic foot exam was performed with the following findings:   No deformities, ulcerations, or other skin breakdown Normal sensation of 10g monofilament Intact posterior tibialis and dorsalis pedis  pulses      Outpatient Encounter Medications as of 07/13/2023  Medication Sig   acetaminophen (TYLENOL) 500 MG tablet Take 1,000 mg by mouth daily as needed for moderate pain.    amLODipine (NORVASC) 10 MG tablet Take 1 tablet (10 mg total) by mouth daily.   fexofenadine-pseudoephedrine (ALLEGRA-D) 60-120 MG 12 hr tablet Take 1 tablet by mouth daily as needed (allergies).    hydrochlorothiazide (HYDRODIURIL) 25 MG tablet Take 1 tablet (25 mg total) by mouth daily.   ibuprofen (ADVIL,MOTRIN) 200 MG tablet Take 600-800 mg by mouth daily as needed (arthritis).    lisinopril (ZESTRIL) 40 MG tablet Take 1 tablet (40 mg total) by mouth daily.   loratadine (CLARITIN) 10 MG tablet Take 10 mg by mouth daily.   magnesium oxide (MAG-OX) 400 MG tablet Take 1 tablet (400 mg total) by mouth daily. (Patient taking differently: Take 400 mg by mouth 2 (two) times daily.)   metFORMIN (GLUCOPHAGE-XR) 500 MG 24 hr tablet TAKE 1 TABLET(500 MG) BY MOUTH TWICE DAILY   naproxen sodium (ANAPROX) 220 MG tablet Take 440 mg by mouth daily as needed (pain).    PARoxetine (PAXIL) 10 MG tablet Take 1 tablet (10 mg total) by mouth daily.   rosuvastatin (CRESTOR) 10 MG tablet Take 1 tablet (10 mg total) by mouth daily.   Semaglutide, 1 MG/DOSE, (OZEMPIC, 1 MG/DOSE,) 4 MG/3ML SOPN 1 mg under the skin weekly   [DISCONTINUED] amLODipine (NORVASC) 10 MG tablet TAKE 1 TABLET BY MOUTH DAILY   [DISCONTINUED] hydrochlorothiazide (HYDRODIURIL) 25 MG tablet TAKE 1 TABLET(25 MG) BY MOUTH DAILY   [DISCONTINUED] lisinopril (ZESTRIL)  40 MG tablet TAKE 1 TABLET BY MOUTH DAILY   [DISCONTINUED] PARoxetine (PAXIL) 10 MG tablet TAKE 1 TABLET(10 MG) BY MOUTH EVERY MORNING   [DISCONTINUED] rosuvastatin (CRESTOR) 10 MG tablet TAKE 1 TABLET(10 MG) BY MOUTH DAILY   No facility-administered encounter medications on file as of 07/13/2023.     Lab Results  Component Value Date   WBC 6.9 03/05/2023   HGB 14.0 03/05/2023   HCT 39.6 03/05/2023    PLT 265.0 03/05/2023   GLUCOSE 126 (H) 07/09/2023   CHOL 158 07/09/2023   TRIG 170.0 (H) 07/09/2023   HDL 41.20 07/09/2023   LDLDIRECT 130.0 07/11/2019   LDLCALC 83 07/09/2023   ALT 26 07/09/2023   AST 21 07/09/2023   NA 140 07/09/2023   K 4.0 07/09/2023   CL 104 07/09/2023   CREATININE 0.83 07/09/2023   BUN 19 07/09/2023   CO2 27 07/09/2023   TSH 3.73 07/09/2023   HGBA1C 6.3 07/09/2023   MICROALBUR 1.5 05/16/2022    MM 3D SCREENING MAMMOGRAM BILATERAL BREAST Result Date: 06/08/2023 CLINICAL DATA:  Screening. EXAM: DIGITAL SCREENING BILATERAL MAMMOGRAM WITH TOMOSYNTHESIS AND CAD TECHNIQUE: Bilateral screening digital craniocaudal and mediolateral oblique mammograms were obtained. Bilateral screening digital breast tomosynthesis was performed. The images were evaluated with computer-aided detection. COMPARISON:  Previous exam(s). ACR Breast Density Category c: The breasts are heterogeneously dense, which may obscure small masses. FINDINGS: There are no findings suspicious for malignancy. IMPRESSION: No mammographic evidence of malignancy. A result letter of this screening mammogram will be mailed directly to the patient. RECOMMENDATION: Screening mammogram in one year. (Code:SM-B-01Y) BI-RADS CATEGORY  1: Negative. Electronically Signed   By: Amie Portland M.D.   On: 06/08/2023 13:04       Assessment & Plan:  Hypercholesterolemia Assessment & Plan: Low carb diet and exercise.  On crestor.  Follow lipid panel and liver function tests.   Lab Results  Component Value Date   CHOL 158 07/09/2023   HDL 41.20 07/09/2023   LDLCALC 83 07/09/2023   LDLDIRECT 130.0 07/11/2019   TRIG 170.0 (H) 07/09/2023   CHOLHDL 4 07/09/2023     Orders: -     Lipid panel; Future -     Hepatic function panel; Future  Type 2 diabetes mellitus with hyperglycemia, without long-term current use of insulin (HCC) Assessment & Plan: On metformin. Off ozempic due to cost. A1c 6.3. continue low carb diet and  exercise. Would like to get her back in ozempic. Medication assistance. Follow met b and A1c.   Orders: -     Hemoglobin A1c; Future -     AMB Referral VBCI Care Management -     Microalbumin / creatinine urine ratio; Future  Primary hypertension Assessment & Plan: Continue hctz, amlodipine and lisinopril.  Follow pressures.  Follow metabolic panel. No changes in medication today.   Orders: -     Basic metabolic panel with GFR; Future  Skin lesion Assessment & Plan: Lesion - posterior back/flank. Refer to dermatology for evaluation.   Orders: -     Ambulatory referral to Dermatology  Abnormal liver function tests Assessment & Plan: Previous ultrasound revealed fatty liver.  Has adjusted diet/lost weight. Continue low carb diet, exercise and weight loss.  Follow liver function tests. Reports no increased alcohol intake. Discussed normal liver function today on recent labs.    History of colonic polyps Assessment & Plan: Had colonoscopy 01/2021 - Dr Servando Snare - diverticulosis and non bleeding internal hemorrhoids.  Recommended f/u colonoscopy in 7  years.     Stress Assessment & Plan: Increased stress with her mother's health issues. Overall appears to be handling things relatively well. Follow    Other orders -     amLODIPine Besylate; Take 1 tablet (10 mg total) by mouth daily.  Dispense: 90 tablet; Refill: 1 -     hydroCHLOROthiazide; Take 1 tablet (25 mg total) by mouth daily.  Dispense: 90 tablet; Refill: 1 -     Lisinopril; Take 1 tablet (40 mg total) by mouth daily.  Dispense: 90 tablet; Refill: 1 -     PARoxetine HCl; Take 1 tablet (10 mg total) by mouth daily.  Dispense: 90 tablet; Refill: 1 -     Rosuvastatin Calcium; Take 1 tablet (10 mg total) by mouth daily.  Dispense: 90 tablet; Refill: 1     Dellar Fenton, MD

## 2023-07-13 NOTE — Assessment & Plan Note (Signed)
 On metformin. Off ozempic due to cost. A1c 6.3. continue low carb diet and exercise. Would like to get her back in ozempic. Medication assistance. Follow met b and A1c.

## 2023-07-13 NOTE — Assessment & Plan Note (Signed)
 Low carb diet and exercise.  On crestor.  Follow lipid panel and liver function tests.   Lab Results  Component Value Date   CHOL 158 07/09/2023   HDL 41.20 07/09/2023   LDLCALC 83 07/09/2023   LDLDIRECT 130.0 07/11/2019   TRIG 170.0 (H) 07/09/2023   CHOLHDL 4 07/09/2023

## 2023-07-13 NOTE — Assessment & Plan Note (Signed)
 Increased stress with her mother's health issues. Overall appears to be handling things relatively well. Follow

## 2023-07-13 NOTE — Assessment & Plan Note (Signed)
 Had colonoscopy 01/2021 - Dr Servando Snare - diverticulosis and non bleeding internal hemorrhoids.  Recommended f/u colonoscopy in 7 years.

## 2023-07-13 NOTE — Assessment & Plan Note (Signed)
 Previous ultrasound revealed fatty liver.  Has adjusted diet/lost weight. Continue low carb diet, exercise and weight loss.  Follow liver function tests. Reports no increased alcohol intake. Discussed normal liver function today on recent labs.

## 2023-07-13 NOTE — Assessment & Plan Note (Signed)
 Continue hctz, amlodipine and lisinopril.  Follow pressures.  Follow metabolic panel. No changes in medication today.

## 2023-07-13 NOTE — Assessment & Plan Note (Signed)
 Lesion - posterior back/flank. Refer to dermatology for evaluation.

## 2023-07-16 ENCOUNTER — Telehealth: Payer: Self-pay | Admitting: *Deleted

## 2023-07-16 NOTE — Progress Notes (Signed)
 Care Guide Pharmacy Note  07/16/2023 Name: Kathleen Yu MRN: [id number] DOB: 11/04/1955  Referred By: Dellar Fenton, MD Reason for referral: Complex Care Management and Call Attempt #1 (Outreach to schedule referral with pharmacist )   Kathleen Yu is a 68 y.o. year old female who is a primary care patient of Dellar Fenton, MD.  Kathleen Yu was referred to the pharmacist for assistance related to: DMII  An unsuccessful telephone outreach was attempted today to contact the patient who was referred to the pharmacy team for assistance with medication assistance. Additional attempts will be made to contact the patient.  Kathleen Yu, CMA [place]  [facility] Guide Direct Dial: [phone]  Fax: [phone] Website: [email or web address]

## 2023-07-17 NOTE — Progress Notes (Signed)
 Care Guide Pharmacy Note  07/17/2023 Name: Kathleen Yu MRN: [id number] DOB: 23-Jul-1955  Referred By: Dellar Fenton, MD Reason for referral: Complex Care Management and Call Attempt #1 (Outreach to schedule referral with pharmacist )   Kathleen Yu is a 68 y.o. year old female who is a primary care patient of Dellar Fenton, MD.  Kathleen Yu was referred to the pharmacist for assistance related to: DMII  Successful contact was made with the patient to discuss pharmacy services including being ready for the pharmacist to call at least 5 minutes before the scheduled appointment time and to have medication bottles and any blood pressure readings ready for review. The patient agreed to meet with the pharmacist via telephone visit on 07/27/2023  Kathleen Yu, CMA Kathleen Yu  [facility] Guide Direct Dial: [phone]  Fax: [phone] Website: [email or web address]

## 2023-07-27 ENCOUNTER — Telehealth

## 2023-07-27 ENCOUNTER — Other Ambulatory Visit (INDEPENDENT_AMBULATORY_CARE_PROVIDER_SITE_OTHER): Admitting: Pharmacist

## 2023-07-27 DIAGNOSIS — E1165 Type 2 diabetes mellitus with hyperglycemia: Secondary | ICD-10-CM

## 2023-07-27 NOTE — Progress Notes (Signed)
   07/27/2023 Name: [name] MRN: [id number] DOB: Mar 12, 1956  Subjective  Chief Complaint  Patient presents with   Diabetes   Medication Access    Care Team: Primary Care Provider: Dellar Fenton, MD  Reason for visit: ?  Kathleen Yu is a 68 y.o. female who presents today for a telephone visit with the pharmacist due to medication access concerns regarding Ozempic  . ?   Medication Access: ?  Reports that all medications are not affordable.  Has been taking Ozempic  though in the new year, her deductible has caused cost to increase. ~$300+. She did decide she would just pay in hopes to reach her deductible though is not aware of what her total deductible is, or what her payment will be after reaching deductible.   Prescription drug coverage: YES Payor: BLUE CROSS BLUE SHIELD MEDICARE / Plan: BCBS MEDICARE / Product Type: *No Product type* / .  Plan: BlueMedicare Essential Plus (HMO-POS) Summary of Benefit: Monthly Premium $0 Medication Deductible: Tier 1, 2, 6 (N/A); Tier 3, 4 ($375) Tier 3 medication cost after deductible: $45/month   Current Patient Assistance: No  Patient lives in a household of 2 and reports both herself and her husband are still working, exceeding $80,000 annually.   Medicare LIS Eligible: No  Eligible for PAP: No   Assessment and Plan:   1. Medication Access Provided Medicare education. We reviewed patient's specific plan including her deductible and copay thereafter. Reviewed that there is NO donut hole this year so her cost will not increase. Patient does feel that this cost is affordable for her as she has just about, if not already, met her deductible.  Patient is not eligible for manufacturer assistance programs due to high income. We discussed this as a possible future option once retired/decreased income.  Patient is not eligible for copay cards due to government insurance.  Future Appointments  Date Time Provider Department Center   11/11/2023  8:15 AM [facility]  11/18/2023  8:30 AM Dellar Fenton, MD [facility]    Daron Ellen, PharmD Clinical Pharmacist [facility] [phone]

## 2023-07-27 NOTE — Patient Instructions (Addendum)
 Kathleen Yu,   It was a pleasure to speak with you today! As we discussed:?   Continue Ozempic  as you have been taking it  According to the insurance card that we have on file for you from 2024, your benefits are listed below: Plan: BlueMedicare Essential Plus (HMO-POS) Summary of Benefit: Monthly Premium $0 Medication Deductible: Tier 1, 2, 6 (No deductible on lower tiers); Brand medications = Tier 3, 4 ($375 deductible) Tier 3 (preferred brand) medication cost after deductible: $45/month    Based on this information, I would expect your cost to be much cheaper moving forward.  Something else to note this year, Medicare is gotten rid of the donut hole (simply put, once your copay os $45/month if will NOT increase during this calendar year at any point)  Changes to Medicare Part D (Drug Coverage) in 2025 As a part of the Inflation Reduction Act (IRA) implemented by the [employer], in 2025,  Medicare must get rid of the "[employer]" coverage gap. This means that if you are used to your medication costs increasing significantly during the year, ideally this will no longer be an issue starting in 2025.   According to Medicare the following changes are expected in 2025: Maximum out of pocket drug expenses (amount you are paying at the pharmacy) will be capped at $2,000 per year (this is not the same thing as a deductible) Getting rid of the Coverage Gap [facility]")  -  (If you reach the out of pocket maximum of $2,000 in copays, your medication costs should drastically DECREASE thereafter, instead of increase)   Please reach out prior to your next scheduled appointment should you have any questions or concerns.  Thank you!   Future Appointments  Date Time Provider Department Center  11/11/2023  8:15 AM [facility]  11/18/2023  8:30 AM Dellar Fenton, MD [facility]    Daron Ellen, PharmD Clinical Pharmacist [facility] [phone]

## 2023-09-05 ENCOUNTER — Other Ambulatory Visit: Payer: Self-pay | Admitting: Internal Medicine

## 2023-11-10 ENCOUNTER — Other Ambulatory Visit (INDEPENDENT_AMBULATORY_CARE_PROVIDER_SITE_OTHER)

## 2023-11-10 DIAGNOSIS — E1165 Type 2 diabetes mellitus with hyperglycemia: Secondary | ICD-10-CM | POA: Diagnosis not present

## 2023-11-10 DIAGNOSIS — E7800 Pure hypercholesterolemia, unspecified: Secondary | ICD-10-CM | POA: Diagnosis not present

## 2023-11-10 DIAGNOSIS — I10 Essential (primary) hypertension: Secondary | ICD-10-CM

## 2023-11-10 LAB — LIPID PANEL
Cholesterol: 147 mg/dL (ref 0–200)
HDL: 44.3 mg/dL (ref >39.00)
LDL Cholesterol: 74 mg/dL (ref 0–99)
NonHDL: 102.32
Total CHOL/HDL Ratio: 3
Triglycerides: 144 mg/dL (ref 0.0–149.0)
VLDL: 28.8 mg/dL (ref 0.0–40.0)

## 2023-11-10 LAB — HEPATIC FUNCTION PANEL
ALT: 24 U/L (ref 0–35)
AST: 19 U/L (ref 0–37)
Albumin: 4.5 g/dL (ref 3.5–5.2)
Alkaline Phosphatase: 48 U/L (ref 39–117)
Bilirubin, Direct: 0.1 mg/dL (ref 0.0–0.3)
Total Bilirubin: 0.6 mg/dL (ref 0.2–1.2)
Total Protein: 6.9 g/dL (ref 6.0–8.3)

## 2023-11-10 LAB — MICROALBUMIN / CREATININE URINE RATIO
Creatinine,U: 149 mg/dL
Microalb Creat Ratio: 9.4 mg/g (ref 0.0–30.0)
Microalb, Ur: 1.4 mg/dL (ref 0.0–1.9)

## 2023-11-10 LAB — BASIC METABOLIC PANEL WITH GFR
BUN: 17 mg/dL (ref 6–23)
CO2: 28 meq/L (ref 19–32)
Calcium: 9.4 mg/dL (ref 8.4–10.5)
Chloride: 103 meq/L (ref 96–112)
Creatinine, Ser: 0.79 mg/dL (ref 0.40–1.20)
GFR: 77.48 mL/min (ref >60.00)
Glucose, Bld: 80 mg/dL (ref 70–99)
Potassium: 4 meq/L (ref 3.5–5.1)
Sodium: 139 meq/L (ref 135–145)

## 2023-11-10 LAB — HEMOGLOBIN A1C: Hgb A1c MFr Bld: 6 % (ref 4.6–6.5)

## 2023-11-11 ENCOUNTER — Other Ambulatory Visit

## 2023-11-12 ENCOUNTER — Ambulatory Visit: Payer: Self-pay | Admitting: Internal Medicine

## 2023-11-18 ENCOUNTER — Encounter: Payer: Self-pay | Admitting: Internal Medicine

## 2023-11-18 ENCOUNTER — Ambulatory Visit (INDEPENDENT_AMBULATORY_CARE_PROVIDER_SITE_OTHER): Admitting: Internal Medicine

## 2023-11-18 VITALS — BP 118/74 | HR 85 | Resp 16 | Ht 62.0 in | Wt 176.0 lb

## 2023-11-18 DIAGNOSIS — E1165 Type 2 diabetes mellitus with hyperglycemia: Secondary | ICD-10-CM

## 2023-11-18 DIAGNOSIS — F439 Reaction to severe stress, unspecified: Secondary | ICD-10-CM | POA: Diagnosis not present

## 2023-11-18 DIAGNOSIS — Z860100 Personal history of colon polyps, unspecified: Secondary | ICD-10-CM

## 2023-11-18 DIAGNOSIS — E7800 Pure hypercholesterolemia, unspecified: Secondary | ICD-10-CM

## 2023-11-18 DIAGNOSIS — Z7985 Long-term (current) use of injectable non-insulin antidiabetic drugs: Secondary | ICD-10-CM

## 2023-11-18 DIAGNOSIS — Z7984 Long term (current) use of oral hypoglycemic drugs: Secondary | ICD-10-CM

## 2023-11-18 DIAGNOSIS — I10 Essential (primary) hypertension: Secondary | ICD-10-CM

## 2023-11-18 MED ORDER — PAROXETINE HCL 10 MG PO TABS: 10.0000 mg | ORAL_TABLET | Freq: Every day | ORAL | 1 refills | Status: DC

## 2023-11-18 MED ORDER — HYDROCHLOROTHIAZIDE 25 MG PO TABS: 25.0000 mg | ORAL_TABLET | Freq: Every day | ORAL | 1 refills | Status: DC

## 2023-11-18 MED ORDER — METFORMIN HCL ER 500 MG PO TB24: 500.0000 mg | ORAL_TABLET | Freq: Two times a day (BID) | ORAL | 1 refills | Status: DC

## 2023-11-18 MED ORDER — AMLODIPINE BESYLATE 10 MG PO TABS: 10.0000 mg | ORAL_TABLET | Freq: Every day | ORAL | 1 refills | Status: DC

## 2023-11-18 MED ORDER — LISINOPRIL 40 MG PO TABS: 40.0000 mg | ORAL_TABLET | Freq: Every day | ORAL | 1 refills | Status: DC

## 2023-11-18 MED ORDER — ROSUVASTATIN CALCIUM 10 MG PO TABS: 10.0000 mg | ORAL_TABLET | Freq: Every day | ORAL | 1 refills | Status: DC

## 2023-11-18 MED ORDER — OZEMPIC (1 MG/DOSE) 4 MG/3ML ~~LOC~~ SOPN: PEN_INJECTOR | SUBCUTANEOUS | 3 refills | Status: DC

## 2023-11-18 NOTE — Assessment & Plan Note (Signed)
 Had colonoscopy 01/2021 - Dr Servando Snare - diverticulosis and non bleeding internal hemorrhoids.  Recommended f/u colonoscopy in 7 years.

## 2023-11-18 NOTE — Assessment & Plan Note (Signed)
 Low carb diet and exercise.  Follow lipid panel and liver function tests.  Continue crestor . No changes in medication today.  Lab Results  Component Value Date   CHOL 147 11/10/2023   HDL 44.30 11/10/2023   LDLCALC 74 11/10/2023   LDLDIRECT 130.0 07/11/2019   TRIG 144.0 11/10/2023   CHOLHDL 3 11/10/2023

## 2023-11-18 NOTE — Progress Notes (Signed)
 Subjective:    [name], female    DOB: 03-08-1956, 68 y.o.   MRN: [id number]  Patient here for  Chief Complaint  Patient presents with   Medical Management of Chronic Issues    HPI Here for a scheduled follow up -  follow up regarding hypercholesterolemia, hypertension and diabetes. Continues on metformin  and ozempic . Discussed labs. Recent A1c 6.0.  Triglycerides most recent check within normal limits.  Increased stress with her mother's health issues.  Hospice is involved.  She does feel she is handling things relatively well.  No chest pain or shortness of breath.  No nausea or vomiting.  No abdominal pain or bowel change.   Past Medical History:  Diagnosis Date   Abnormal liver function tests    Allergy    Anemia    H/O   Arthritis    RA   Depression    Diabetes mellitus without complication (HCC)    Fatty liver    Hypercholesterolemia    Hypertension    Migraine headache    better since menopause   Neuromuscular disorder (HCC)    Recurrent sinusitis    allergies   Tuberculosis 1980'S   + TB TEST-WAS TREATED   Past Surgical History:  Procedure Laterality Date   BILATERAL CARPAL TUNNEL RELEASE     BREAST BIOPSY Left 05/12/2017   FRAGMENTS WITH FEATURES OF AN EARLY INTRADUCTAL PAPILLOMA WITH aprocine changes   BREAST BIOPSY Left 06/12/2017   Procedure: BREAST BIOPSY;  Surgeon: Dessa Reyes ORN, MD;  Location: [facility];  Service: General;  Laterality: Left;   BREAST DUCTAL SYSTEM EXCISION Left 06/12/2017   Procedure: EXCISION DUCTAL SYSTEM BREAST;  Surgeon: Dessa Reyes ORN, MD;  Location: [facility];  Service: General;  Laterality: Left;   BREAST EXCISIONAL BIOPSY Left 2004   x 2   COLONOSCOPY WITH PROPOFOL  N/A 02/11/2016   Procedure: COLONOSCOPY WITH PROPOFOL ;  Surgeon: Rogelia Copping, MD;  Location: [facility];  Service: Endoscopy;  Laterality: N/A;   COLONOSCOPY WITH PROPOFOL  N/A 02/18/2021   Procedure: COLONOSCOPY WITH PROPOFOL ;   Surgeon: Copping Rogelia, MD;  Location: [facility];  Service: Endoscopy;  Laterality: N/A;   EXCISION MORTON'S NEUROMA Left 06/01/2015   Procedure: EXCISION MORTON'S NEUROMA;  Surgeon: Krystal Rosella, DPM;  Location: [facility];  Service: Podiatry;  Laterality: Left;  Local w/ mac   FRACTURE SURGERY  2017   OSTECTOMY Left 06/01/2015   Procedure: OSTECTOMY/ part. excision fracture fragment left 4th toe;  Surgeon: Krystal Rosella, DPM;  Location: [facility];  Service: Podiatry;  Laterality: Left;   POLYPECTOMY N/A 02/11/2016   Procedure: POLYPECTOMY;  Surgeon: Rogelia Copping, MD;  Location: [facility];  Service: Endoscopy;  Laterality: N/A;   RHINOPLASTY     TUBAL LIGATION  1997   Family History  Problem Relation Age of Onset   Heart disease Father        died age 81 (MI)   Heart disease Other        4 uncles - CABG   Colon cancer Other        great aunt   Heart disease Paternal Grandmother        first MI - 33s   Migraines Mother    Breast cancer Mother 3   Hypertension Mother    Hypertension Maternal Grandmother    Early death Maternal Grandmother    Diabetes Paternal Grandfather    Stroke Paternal Grandfather    Depression Maternal Aunt  Breast cancer Other        1 mat and 1 pat  gr aunt   Social History   Socioeconomic History   Marital status: Married    Spouse name: Not on file   Number of children: 2   Years of education: Not on file   Highest education level: Associate degree: occupational, Scientist, product/process development, or vocational program  Occupational History   Not on file  Tobacco Use   Smoking status: Never   Smokeless tobacco: Never  Vaping Use   Vaping status: Never Used  Substance and Sexual Activity   Alcohol use: Yes    Alcohol/week: 1.0 standard drink of alcohol    Types: 1 Cans of beer per week    Comment: occasional   Drug use: No   Sexual activity: Yes    Birth control/protection: Post-menopausal, Surgical  Other Topics Concern   Not on file  Social History  Narrative   Not on file   Social Drivers of Health   Financial Resource Strain: Low Risk  (11/15/2023)   Overall Financial Resource Strain (CARDIA)    Difficulty of Paying Living Expenses: Not hard at all  Food Insecurity: No Food Insecurity (11/15/2023)   Hunger Vital Sign    Worried About Running Out of Food in the Last Year: Never true    Ran Out of Food in the Last Year: Never true  Transportation Needs: No Transportation Needs (11/15/2023)   PRAPARE - Administrator, Civil Service (Medical): No    Lack of Transportation (Non-Medical): No  Physical Activity: Insufficiently Active (11/15/2023)   Exercise Vital Sign    Days of Exercise per Week: 3 days    Minutes of Exercise per Session: 20 min  Stress: No Stress Concern Present (11/15/2023)   [employer] of Occupational Health - Occupational Stress Questionnaire    Feeling of Stress: Not at all  Social Connections: Socially Integrated (11/15/2023)   Social Connection and Isolation Panel    Frequency of Communication with Friends and Family: Twice a week    Frequency of Social Gatherings with Friends and Family: Twice a week    Attends Religious Services: More than 4 times per year    Active Member of [employer] or Organizations: Yes    Attends Banker Meetings: 1 to 4 times per year    Marital Status: Married     Review of Systems  Constitutional:  Negative for appetite change and unexpected weight change.  HENT:  Negative for congestion and sinus pressure.   Respiratory:  Negative for cough, chest tightness and shortness of breath.   Cardiovascular:  Negative for chest pain, palpitations and leg swelling.  Gastrointestinal:  Negative for abdominal pain, diarrhea, nausea and vomiting.  Genitourinary:  Negative for difficulty urinating and dysuria.  Musculoskeletal:  Negative for joint swelling and myalgias.  Skin:  Negative for color change and rash.  Neurological:  Negative for dizziness and  headaches.  Psychiatric/Behavioral:  Negative for agitation and dysphoric mood.        Objective:     BP 118/74   Pulse 85   Resp 16   Ht 5' 2 (1.575 m)   Wt 176 lb (79.8 kg)   LMP 05/30/2007   SpO2 98%   BMI 32.19 kg/m  Wt Readings from Last 3 Encounters:  11/18/23 176 lb (79.8 kg)  07/13/23 178 lb (80.7 kg)  03/09/23 176 lb (79.8 kg)    Physical Exam Vitals reviewed.  Constitutional:  General: She is not in acute distress.    Appearance: Normal appearance.  HENT:     Head: Normocephalic and atraumatic.     Right Ear: External ear normal.     Left Ear: External ear normal.     Mouth/Throat:     Pharynx: No oropharyngeal exudate or posterior oropharyngeal erythema.  Eyes:     General: No scleral icterus.       Right eye: No discharge.        Left eye: No discharge.     Conjunctiva/sclera: Conjunctivae normal.  Neck:     Thyroid : No thyromegaly.  Cardiovascular:     Rate and Rhythm: Normal rate and regular rhythm.  Pulmonary:     Effort: No respiratory distress.     Breath sounds: Normal breath sounds. No wheezing.  Abdominal:     General: Bowel sounds are normal.     Palpations: Abdomen is soft.     Tenderness: There is no abdominal tenderness.  Musculoskeletal:        General: No swelling or tenderness.     Cervical back: Neck supple. No tenderness.  Lymphadenopathy:     Cervical: No cervical adenopathy.  Skin:    Findings: No erythema or rash.  Neurological:     Mental Status: She is alert.  Psychiatric:        Mood and Affect: Mood normal.        Behavior: Behavior normal.         Outpatient Encounter Medications as of 11/18/2023  Medication Sig   acetaminophen  (TYLENOL ) 500 MG tablet Take 1,000 mg by mouth daily as needed for moderate pain.    amLODipine  (NORVASC ) 10 MG tablet Take 1 tablet (10 mg total) by mouth daily.   fexofenadine-pseudoephedrine (ALLEGRA-D) 60-120 MG 12 hr tablet Take 1 tablet by mouth daily as needed (allergies).     hydrochlorothiazide  (HYDRODIURIL ) 25 MG tablet Take 1 tablet (25 mg total) by mouth daily.   ibuprofen (ADVIL,MOTRIN) 200 MG tablet Take 600-800 mg by mouth daily as needed (arthritis).    lisinopril  (ZESTRIL ) 40 MG tablet Take 1 tablet (40 mg total) by mouth daily.   loratadine (CLARITIN) 10 MG tablet Take 10 mg by mouth daily.   magnesium  oxide (MAG-OX) 400 MG tablet Take 1 tablet (400 mg total) by mouth daily. (Patient taking differently: Take 400 mg by mouth 2 (two) times daily.)   metFORMIN  (GLUCOPHAGE -XR) 500 MG 24 hr tablet Take 1 tablet (500 mg total) by mouth 2 (two) times daily with a meal.   naproxen sodium (ANAPROX) 220 MG tablet Take 440 mg by mouth daily as needed (pain).    PARoxetine  (PAXIL ) 10 MG tablet Take 1 tablet (10 mg total) by mouth daily.   rosuvastatin  (CRESTOR ) 10 MG tablet Take 1 tablet (10 mg total) by mouth daily.   Semaglutide , 1 MG/DOSE, (OZEMPIC , 1 MG/DOSE,) 4 MG/3ML SOPN INJECT 1 MG UNDER THE SKIN ONCE A WEEK   [DISCONTINUED] amLODipine  (NORVASC ) 10 MG tablet Take 1 tablet (10 mg total) by mouth daily.   [DISCONTINUED] hydrochlorothiazide  (HYDRODIURIL ) 25 MG tablet Take 1 tablet (25 mg total) by mouth daily.   [DISCONTINUED] lisinopril  (ZESTRIL ) 40 MG tablet Take 1 tablet (40 mg total) by mouth daily.   [DISCONTINUED] metFORMIN  (GLUCOPHAGE -XR) 500 MG 24 hr tablet TAKE 1 TABLET(500 MG) BY MOUTH TWICE DAILY   [DISCONTINUED] PARoxetine  (PAXIL ) 10 MG tablet Take 1 tablet (10 mg total) by mouth daily.   [DISCONTINUED] rosuvastatin  (CRESTOR ) 10 MG tablet Take  1 tablet (10 mg total) by mouth daily.   [DISCONTINUED] Semaglutide , 1 MG/DOSE, (OZEMPIC , 1 MG/DOSE,) 4 MG/3ML SOPN INJECT 1 MG UNDER THE SKIN ONCE A WEEK   No facility-administered encounter medications on file as of 11/18/2023.     Lab Results  Component Value Date   WBC 6.9 03/05/2023   HGB 14.0 03/05/2023   HCT 39.6 03/05/2023   PLT 265.0 03/05/2023   GLUCOSE 80 11/10/2023   CHOL 147 11/10/2023    TRIG 144.0 11/10/2023   HDL 44.30 11/10/2023   LDLDIRECT 130.0 07/11/2019   LDLCALC 74 11/10/2023   ALT 24 11/10/2023   AST 19 11/10/2023   NA 139 11/10/2023   K 4.0 11/10/2023   CL 103 11/10/2023   CREATININE 0.79 11/10/2023   BUN 17 11/10/2023   CO2 28 11/10/2023   TSH 3.73 07/09/2023   HGBA1C 6.0 11/10/2023   MICROALBUR 1.4 11/10/2023    MM 3D SCREENING MAMMOGRAM BILATERAL BREAST Result Date: 06/08/2023 CLINICAL DATA:  Screening. EXAM: DIGITAL SCREENING BILATERAL MAMMOGRAM WITH TOMOSYNTHESIS AND CAD TECHNIQUE: Bilateral screening digital craniocaudal and mediolateral oblique mammograms were obtained. Bilateral screening digital breast tomosynthesis was performed. The images were evaluated with computer-aided detection. COMPARISON:  Previous exam(s). ACR Breast Density Category c: The breasts are heterogeneously dense, which may obscure small masses. FINDINGS: There are no findings suspicious for malignancy. IMPRESSION: No mammographic evidence of malignancy. A result letter of this screening mammogram will be mailed directly to the patient. RECOMMENDATION: Screening mammogram in one year. (Code:SM-B-01Y) BI-RADS CATEGORY  1: Negative. Electronically Signed   By: Alm Parkins M.D.   On: 06/08/2023 13:04       Assessment & Plan:  Stress Assessment & Plan: Increased stress with her mother's health issues. Overall appears to be handling things relatively well. Discussed. Will notify me if feels needs any further intervention.    Hypercholesterolemia Assessment & Plan: Low carb diet and exercise.  Follow lipid panel and liver function tests.  Continue crestor . No changes in medication today.  Lab Results  Component Value Date   CHOL 147 11/10/2023   HDL 44.30 11/10/2023   LDLCALC 74 11/10/2023   LDLDIRECT 130.0 07/11/2019   TRIG 144.0 11/10/2023   CHOLHDL 3 11/10/2023     Orders: -     Lipid panel; Future -     Hepatic function panel; Future -     CBC with  Differential/Platelet; Future  Type 2 diabetes mellitus with hyperglycemia, without long-term current use of insulin (HCC) Assessment & Plan: On metformin .  On Ozempic .  A1c just checked was 6.0.  She is doing well on the medication.  Continue low-carb diet and exercise.  Follow Mcvey and A1c.    Orders: -     Hemoglobin A1c; Future  Primary hypertension Assessment & Plan: Continue hctz, amlodipine  and lisinopril .  Follow pressures.  Follow metabolic panel. No changes in mediction today.   Orders: -     Basic metabolic panel with GFR; Future  History of colonic polyps Assessment & Plan: Had colonoscopy 01/2021 - Dr Jinny - diverticulosis and non bleeding internal hemorrhoids.  Recommended f/u colonoscopy in 7 years.     Other orders -     amLODIPine  Besylate; Take 1 tablet (10 mg total) by mouth daily.  Dispense: 90 tablet; Refill: 1 -     hydroCHLOROthiazide ; Take 1 tablet (25 mg total) by mouth daily.  Dispense: 90 tablet; Refill: 1 -     Lisinopril ; Take 1 tablet (40 mg total)  by mouth daily.  Dispense: 90 tablet; Refill: 1 -     metFORMIN  HCl ER; Take 1 tablet (500 mg total) by mouth 2 (two) times daily with a meal.  Dispense: 180 tablet; Refill: 1 -     PARoxetine  HCl; Take 1 tablet (10 mg total) by mouth daily.  Dispense: 90 tablet; Refill: 1 -     Rosuvastatin  Calcium ; Take 1 tablet (10 mg total) by mouth daily.  Dispense: 90 tablet; Refill: 1 -     Ozempic  (1 MG/DOSE); INJECT 1 MG UNDER THE SKIN ONCE A WEEK  Dispense: 3 mL; Refill: 3     Allena Hamilton, MD

## 2023-11-18 NOTE — Assessment & Plan Note (Signed)
 On metformin .  On Ozempic .  A1c just checked was 6.0.  She is doing well on the medication.  Continue low-carb diet and exercise.  Follow Mcvey and A1c.

## 2023-11-18 NOTE — Assessment & Plan Note (Signed)
 Increased stress with her mother's health issues. Overall appears to be handling things relatively well. Discussed. Will notify me if feels needs any further intervention.

## 2023-11-18 NOTE — Assessment & Plan Note (Signed)
 Continue hctz, amlodipine  and lisinopril .  Follow pressures.  Follow metabolic panel. No changes in mediction today.

## 2023-11-21 LAB — MICROALBUMIN / CREATININE URINE RATIO
Creatinine, POC: 0.82 mg/dL
EGFR: 78
Microalb Creat Ratio: <30

## 2023-11-21 LAB — PROTEIN / CREATININE RATIO, URINE: Creatinine, Urine: 0.82

## 2023-11-21 LAB — COMPREHENSIVE METABOLIC PANEL WITH GFR: eGFR: 78

## 2023-12-09 DIAGNOSIS — K080 Exfoliation of teeth due to systemic causes: Secondary | ICD-10-CM | POA: Diagnosis not present

## 2024-01-07 ENCOUNTER — Other Ambulatory Visit: Payer: Self-pay | Admitting: Internal Medicine

## 2024-02-01 DIAGNOSIS — E119 Type 2 diabetes mellitus without complications: Secondary | ICD-10-CM | POA: Diagnosis not present

## 2024-02-01 DIAGNOSIS — H2513 Age-related nuclear cataract, bilateral: Secondary | ICD-10-CM | POA: Diagnosis not present

## 2024-02-01 LAB — OPHTHALMOLOGY REPORT-SCANNED

## 2024-02-16 DIAGNOSIS — K080 Exfoliation of teeth due to systemic causes: Secondary | ICD-10-CM | POA: Diagnosis not present

## 2024-03-15 ENCOUNTER — Ambulatory Visit

## 2024-03-15 VITALS — Ht 62.0 in | Wt 170.0 lb

## 2024-03-15 DIAGNOSIS — Z0000 Encounter for general adult medical examination without abnormal findings: Secondary | ICD-10-CM | POA: Diagnosis not present

## 2024-03-15 DIAGNOSIS — Z1231 Encounter for screening mammogram for malignant neoplasm of breast: Secondary | ICD-10-CM

## 2024-03-15 NOTE — Patient Instructions (Signed)
 Kathleen Yu,  Thank you for taking the time for your Medicare Wellness Visit. I appreciate your continued commitment to your health goals. Please review the care plan we discussed, and feel free to reach out if I can assist you further.  Please note that Annual Wellness Visits do not include a physical exam. Some assessments may be limited, especially if the visit was conducted virtually. If needed, we may recommend an in-person follow-up with your provider.  Ongoing Care Seeing your primary care provider every 3 to 6 months helps us  monitor your health and provide consistent, personalized care.  Consider updating your covid vaccine. You have an order for:  []   2D Mammogram  [x]   3D Mammogram  []   Bone Density     Please call for appointment:  [facility]  [address] Kathleen Yu Kathleen Yu [phone]   Make sure to wear two-piece clothing.  No lotions, powders, or deodorants the day of the appointment. Make sure to bring picture ID and insurance card.  Bring list of medications you are currently taking including any supplements.    Referrals If a referral was made during today's visit and you haven't received any updates within two weeks, please contact the referred provider directly to check on the status.  Recommended Screenings:  Health Maintenance  Topic Date Due   COVID-19 Vaccine (6 - 2025-26 season) 11/30/2023   Hemoglobin A1C  05/12/2024   Breast Cancer Screening  06/02/2024   Complete foot exam   07/12/2024   Yearly kidney function blood test for diabetes  11/09/2024   Yearly kidney health urinalysis for diabetes  11/09/2024   Eye exam for diabetics  01/31/2025   Medicare Annual Wellness Visit  03/15/2025   DTaP/Tdap/Td vaccine (2 - Tdap) 12/23/2025   Colon Cancer Screening  02/19/2028   Pneumococcal Vaccine for age over 56  Completed   Flu Shot  Completed   Osteoporosis screening with Bone Density Scan   Completed   Hepatitis C Screening  Completed   Zoster (Shingles) Vaccine  Completed   Meningitis B Vaccine  Aged Out       03/14/2024    6:00 PM  Advanced Directives  Does Patient Have a Medical Advance Directive? Yes  Type of Estate Agent of [place];Living will  Does patient want to make changes to medical advance directive? No - Patient declined  Copy of Healthcare Power of Attorney in Chart? Yes - validated most recent copy scanned in chart (See row information)    Vision: Annual vision screenings are recommended for early detection of glaucoma, cataracts, and diabetic retinopathy. These exams can also reveal signs of chronic conditions such as diabetes and high blood pressure.  Dental: Annual dental screenings help detect early signs of oral cancer, gum disease, and other conditions linked to overall health, including heart disease and diabetes.  Please see the attached documents for additional preventive care recommendations.

## 2024-03-15 NOTE — Progress Notes (Signed)
 Chief Complaint  Patient presents with   Medicare Wellness     Subjective:   Kathleen Yu is a 68 y.o. female who presents for a Medicare Annual Wellness Visit.  Visit info / Clinical Intake: Medicare Wellness Visit Type:: Initial Annual Wellness Visit Persons participating in visit and providing information:: patient Medicare Wellness Visit Mode:: Telephone If telephone:: video declined Since this visit was completed virtually, some vitals may be partially provided or unavailable. Missing vitals are due to the limitations of the virtual format.: Unable to obtain vitals - no equipment If Telephone or Video please confirm:: I connected with patient using audio/video enable telemedicine. I verified patient identity with two identifiers, discussed telehealth limitations, and patient agreed to proceed. Patient Location:: Home Provider Location:: Office/Home Interpreter Needed?: No Pre-visit prep was completed: yes AWV questionnaire completed by patient prior to visit?: yes Date:: 03/14/24 Living arrangements:: (Patient-Rptd) lives with spouse/significant other; with family/others Patient's Overall Health Status Rating: (Patient-Rptd) very good Typical amount of pain: some (has RA) Does pain affect daily life?: (Patient-Rptd) no Are you currently prescribed opioids?: no  Dietary Habits and Nutritional Risks How many meals a day?: (Patient-Rptd) 3 Eats fruit and vegetables daily?: (Patient-Rptd) yes Most meals are obtained by: (Patient-Rptd) preparing own meals In the last 2 weeks, have you had any of the following?: none Diabetic:: (!) yes Any non-healing wounds?: no How often do you check your BS?: as needed Would you like to be referred to a Nutritionist or for Diabetic Management? : no  Functional Status Activities of Daily Living (to include ambulation/medication): (Patient-Rptd) Independent Ambulation: (Patient-Rptd) Independent Medication Administration: (Patient-Rptd)  Independent Home Management (perform basic housework or laundry): (Patient-Rptd) Independent Manage your own finances?: (Patient-Rptd) yes Primary transportation is: (Patient-Rptd) driving Concerns about vision?: no *vision screening is required for WTM* Concerns about hearing?: no  Fall Screening Falls in the past year?: (Patient-Rptd) 0 Number of falls in past year: 0 Was there an injury with Fall?: 0 Fall Risk Category Calculator: 0 Patient Fall Risk Level: Low Fall Risk  Fall Risk Patient at Risk for Falls Due to: No Fall Risks Fall risk Follow up: Falls evaluation completed; Falls prevention discussed  Home and Transportation Safety: All rugs have non-skid backing?: (Patient-Rptd) yes All stairs or steps have railings?: (Patient-Rptd) N/A, no stairs Grab bars in the bathtub or shower?: (!) (Patient-Rptd) no Have non-skid surface in bathtub or shower?: (Patient-Rptd) yes Good home lighting?: (Patient-Rptd) yes Regular seat belt use?: (Patient-Rptd) yes Hospital stays in the last year:: (Patient-Rptd) no  Cognitive Assessment Difficulty concentrating, remembering, or making decisions? : (Patient-Rptd) no Will 6CIT or Mini Cog be Completed: yes What year is it?: 0 points What month is it?: 0 points Give patient an address phrase to remember (5 components): [address], [place] About what time is it?: 0 points Count backwards from 20 to 1: 0 points Say the months of the year in reverse: 0 points Repeat the address phrase from earlier: 0 points 6 CIT Score: 0 points  Advance Directives (For Healthcare) Does Patient Have a Medical Advance Directive?: Yes Does patient want to make changes to medical advance directive?: No - Patient declined Type of Advance Directive: Healthcare Power of [place]; Living will Copy of Healthcare Power of Attorney in Chart?: Yes - validated most recent copy scanned in chart (See row information) Copy of Living Will in Chart?: Yes -  validated most recent copy scanned in chart (See row information)  Reviewed/Updated  Reviewed/Updated: Reviewed All (  Medical, Surgical, Family, Medications, Allergies, Care Teams, Patient Goals)    Allergies (verified) Patient has no known allergies.   Current Medications (verified) Outpatient Encounter Medications as of 03/15/2024  Medication Sig   acetaminophen  (TYLENOL ) 500 MG tablet Take 1,000 mg by mouth daily as needed for moderate pain.    amLODipine  (NORVASC ) 10 MG tablet Take 1 tablet (10 mg total) by mouth daily.   fexofenadine-pseudoephedrine (ALLEGRA-D) 60-120 MG 12 hr tablet Take 1 tablet by mouth daily as needed (allergies).    hydrochlorothiazide  (HYDRODIURIL ) 25 MG tablet Take 1 tablet (25 mg total) by mouth daily.   ibuprofen (ADVIL,MOTRIN) 200 MG tablet Take 600-800 mg by mouth daily as needed (arthritis).    lisinopril  (ZESTRIL ) 40 MG tablet Take 1 tablet (40 mg total) by mouth daily.   loratadine (CLARITIN) 10 MG tablet Take 10 mg by mouth daily.   magnesium  oxide (MAG-OX) 400 MG tablet Take 1 tablet (400 mg total) by mouth daily. (Patient taking differently: Take 400 mg by mouth 2 (two) times daily.)   metFORMIN  (GLUCOPHAGE -XR) 500 MG 24 hr tablet Take 1 tablet (500 mg total) by mouth 2 (two) times daily with a meal.   naproxen sodium (ANAPROX) 220 MG tablet Take 440 mg by mouth daily as needed (pain).    PARoxetine  (PAXIL ) 10 MG tablet Take 1 tablet (10 mg total) by mouth daily.   rosuvastatin  (CRESTOR ) 10 MG tablet Take 1 tablet (10 mg total) by mouth daily.   Semaglutide , 1 MG/DOSE, (OZEMPIC , 1 MG/DOSE,) 4 MG/3ML SOPN INJECT 1 MG UNDER THE SKIN ONCE A WEEK   No facility-administered encounter medications on file as of 03/15/2024.    History: Past Medical History:  Diagnosis Date   Abnormal liver function tests    Allergy    Anemia    H/O   Arthritis    RA   Depression    Diabetes mellitus without complication (HCC)    Fatty liver     Hypercholesterolemia    Hypertension    Migraine headache    better since menopause   Neuromuscular disorder (HCC)    Recurrent sinusitis    allergies   Tuberculosis 1980'S   + TB TEST-WAS TREATED   Past Surgical History:  Procedure Laterality Date   BILATERAL CARPAL TUNNEL RELEASE     BREAST BIOPSY Left 05/12/2017   FRAGMENTS WITH FEATURES OF AN EARLY INTRADUCTAL PAPILLOMA WITH aprocine changes   BREAST BIOPSY Left 06/12/2017   Procedure: BREAST BIOPSY;  Surgeon: Dessa Reyes ORN, MD;  Location: [facility];  Service: General;  Laterality: Left;   BREAST DUCTAL SYSTEM EXCISION Left 06/12/2017   Procedure: EXCISION DUCTAL SYSTEM BREAST;  Surgeon: Dessa Reyes ORN, MD;  Location: [facility];  Service: General;  Laterality: Left;   BREAST EXCISIONAL BIOPSY Left 2004   x 2   COLONOSCOPY WITH PROPOFOL  N/A 02/11/2016   Procedure: COLONOSCOPY WITH PROPOFOL ;  Surgeon: Rogelia Copping, MD;  Location: [facility];  Service: Endoscopy;  Laterality: N/A;   COLONOSCOPY WITH PROPOFOL  N/A 02/18/2021   Procedure: COLONOSCOPY WITH PROPOFOL ;  Surgeon: Copping Rogelia, MD;  Location: [facility];  Service: Endoscopy;  Laterality: N/A;   EXCISION MORTON'S NEUROMA Left 06/01/2015   Procedure: EXCISION MORTON'S NEUROMA;  Surgeon: Krystal Rosella, DPM;  Location: [facility];  Service: Podiatry;  Laterality: Left;  Local w/ mac   FRACTURE SURGERY  2017   OSTECTOMY Left 06/01/2015   Procedure: OSTECTOMY/ part. excision fracture fragment left 4th toe;  Surgeon: Krystal Rosella, DPM;  Location: [facility];  Service: Podiatry;  Laterality: Left;   POLYPECTOMY N/A 02/11/2016   Procedure: POLYPECTOMY;  Surgeon: Rogelia Copping, MD;  Location: [facility];  Service: Endoscopy;  Laterality: N/A;   RHINOPLASTY     TUBAL LIGATION  1997   Family History  Problem Relation Age of Onset   Heart disease Father        died age 60 (MI)   Heart disease Other        4 uncles - CABG   Colon cancer Other        great aunt    Heart disease Paternal Grandmother        first MI - 34s   Migraines Mother    Breast cancer Mother 45   Hypertension Mother    Hypertension Maternal Grandmother    Early death Maternal Grandmother    Diabetes Paternal Grandfather    Stroke Paternal Grandfather    Depression Maternal Aunt    Breast cancer Other        1 mat and 1 pat  gr aunt   Social History   Occupational History   Not on file  Tobacco Use   Smoking status: Never   Smokeless tobacco: Never  Vaping Use   Vaping status: Never Used  Substance and Sexual Activity   Alcohol use: Yes    Alcohol/week: 1.0 standard drink of alcohol    Types: 1 Cans of beer per week    Comment: occasional   Drug use: No   Sexual activity: Yes    Birth control/protection: Post-menopausal, Surgical   Tobacco Counseling Counseling given: Not Answered  SDOH Screenings   Food Insecurity: No Food Insecurity (03/14/2024)  Housing: Low Risk (03/14/2024)  Transportation Needs: No Transportation Needs (03/14/2024)  Utilities: Not At Risk (03/15/2024)  Alcohol Screen: Low Risk (03/14/2024)  Depression (PHQ2-9): Low Risk (03/15/2024)  Financial Resource Strain: Low Risk (03/14/2024)  Physical Activity: Insufficiently Active (03/14/2024)  Social Connections: Socially Integrated (03/14/2024)  Stress: No Stress Concern Present (03/14/2024)  Tobacco Use: Low Risk (03/15/2024)  Health Literacy: Adequate Health Literacy (03/15/2024)   See flowsheets for full screening details  Depression Screen PHQ 2 & 9 Depression Scale- Over the past 2 weeks, how often have you been bothered by any of the following problems? Little interest or pleasure in doing things: 0 Feeling down, depressed, or hopeless (PHQ Adolescent also includes...irritable): 0 PHQ-2 Total Score: 0 Trouble falling or staying asleep, or sleeping too much: 0 Feeling tired or having little energy: 0 Poor appetite or overeating (PHQ Adolescent also includes...weight loss):  0 Feeling bad about yourself - or that you are a failure or have let yourself or your family down: 0 Trouble concentrating on things, such as reading the newspaper or watching television (PHQ Adolescent also includes...like school work): 0 Moving or speaking so slowly that other people could have noticed. Or the opposite - being so fidgety or restless that you have been moving around a lot more than usual: 0 Thoughts that you would be better off dead, or of hurting yourself in some way: 0 PHQ-9 Total Score: 0 If you checked off any problems, how difficult have these problems made it for you to do your work, take care of things at home, or get along with other people?: Not difficult at all     Goals Addressed             This Visit's Progress    Patient Stated  Wants to stay healthy to take care of her mom             Objective:    Today's Vitals   03/15/24 0852  Weight: 170 lb (77.1 kg)  Height: 5' 2 (1.575 m)   Body mass index is 31.09 kg/m.  Hearing/Vision screen Hearing Screening - Comments:: No issues Vision Screening - Comments:: Glasses,  [facility], up  to date Immunizations and Health Maintenance Health Maintenance  Topic Date Due   COVID-19 Vaccine (6 - 2025-26 season) 11/30/2023   Influenza Vaccine  06/28/2024 (Originally 10/30/2023)   HEMOGLOBIN A1C  05/12/2024   Mammogram  06/02/2024   FOOT EXAM  07/12/2024   Diabetic kidney evaluation - eGFR measurement  11/09/2024   Diabetic kidney evaluation - Urine ACR  11/09/2024   OPHTHALMOLOGY EXAM  01/31/2025   Medicare Annual Wellness (AWV)  03/15/2025   DTaP/Tdap/Td (2 - Tdap) 12/23/2025   Colonoscopy  02/19/2028   Pneumococcal Vaccine: 50+ Years  Completed   Bone Density Scan  Completed   Hepatitis C Screening  Completed   Zoster Vaccines- Shingrix  Completed   Meningococcal B Vaccine  Aged Out        Assessment/Plan:  This is a routine wellness examination for Kathleen Yu.  Patient Care  Team: Glendia Shad, MD as PCP - General (Internal Medicine) Jacobo Evalene PARAS, MD as Consulting Physician (Oncology)  I have personally reviewed and noted the following in the patients chart:   Medical and social history Use of alcohol, tobacco or illicit drugs  Current medications and supplements including opioid prescriptions. Functional ability and status Nutritional status Physical activity Advanced directives List of other physicians Hospitalizations, surgeries, and ER visits in previous 12 months Vitals Screenings to include cognitive, depression, and falls Referrals and appointments  No orders of the defined types were placed in this encounter.  In addition, I have reviewed and discussed with patient certain preventive protocols, quality metrics, and best practice recommendations. A written personalized care plan for preventive services as well as general preventive health recommendations were provided to patient.   Angeline Fredericks, LPN   [id number]   Return in 1 year (on 03/15/2025).  After Visit Summary: (MyChart) Due to this being a telephonic visit, the after visit summary with patients personalized plan was offered to patient via MyChart   Nurse Notes: Mammogram ordered. Patient declines covid vaccine at this time.

## 2024-04-01 ENCOUNTER — Other Ambulatory Visit

## 2024-04-01 DIAGNOSIS — E7800 Pure hypercholesterolemia, unspecified: Secondary | ICD-10-CM

## 2024-04-01 DIAGNOSIS — I10 Essential (primary) hypertension: Secondary | ICD-10-CM

## 2024-04-01 DIAGNOSIS — E1165 Type 2 diabetes mellitus with hyperglycemia: Secondary | ICD-10-CM | POA: Diagnosis not present

## 2024-04-01 LAB — CBC WITH DIFFERENTIAL/PLATELET
Basophils Absolute: 0 K/uL (ref 0.0–0.1)
Basophils Relative: 0.5 % (ref 0.0–3.0)
Eosinophils Absolute: 0.2 K/uL (ref 0.0–0.7)
Eosinophils Relative: 2.5 % (ref 0.0–5.0)
HCT: 40.3 % (ref 36.0–46.0)
Hemoglobin: 13.6 g/dL (ref 12.0–15.0)
Lymphocytes Relative: 29.4 % (ref 12.0–46.0)
Lymphs Abs: 2 K/uL (ref 0.7–4.0)
MCHC: 33.8 g/dL (ref 30.0–36.0)
MCV: 86.3 fl (ref 78.0–100.0)
Monocytes Absolute: 0.5 K/uL (ref 0.1–1.0)
Monocytes Relative: 7.8 % (ref 3.0–12.0)
Neutro Abs: 4.1 K/uL (ref 1.4–7.7)
Neutrophils Relative %: 59.8 % (ref 43.0–77.0)
Platelets: 276 K/uL (ref 150.0–400.0)
RBC: 4.67 Mil/uL (ref 3.87–5.11)
RDW: 13.4 % (ref 11.5–15.5)
WBC: 6.8 K/uL (ref 4.0–10.5)

## 2024-04-01 LAB — BASIC METABOLIC PANEL WITH GFR
BUN: 22 mg/dL (ref 6–23)
CO2: 29 meq/L (ref 19–32)
Calcium: 10.1 mg/dL (ref 8.4–10.5)
Chloride: 103 meq/L (ref 96–112)
Creatinine, Ser: 0.91 mg/dL (ref 0.40–1.20)
GFR: 65.2 mL/min
Glucose, Bld: 108 mg/dL — ABNORMAL HIGH (ref 70–99)
Potassium: 4.5 meq/L (ref 3.5–5.1)
Sodium: 141 meq/L (ref 135–145)

## 2024-04-01 LAB — HEPATIC FUNCTION PANEL
ALT: 25 U/L (ref 3–35)
AST: 18 U/L (ref 5–37)
Albumin: 4.9 g/dL (ref 3.5–5.2)
Alkaline Phosphatase: 59 U/L (ref 39–117)
Bilirubin, Direct: 0.1 mg/dL (ref 0.1–0.3)
Total Bilirubin: 0.6 mg/dL (ref 0.2–1.2)
Total Protein: 7.3 g/dL (ref 6.0–8.3)

## 2024-04-01 LAB — LIPID PANEL
Cholesterol: 136 mg/dL (ref 28–200)
HDL: 46.7 mg/dL
LDL Cholesterol: 70 mg/dL (ref 10–99)
NonHDL: 89.42
Total CHOL/HDL Ratio: 3
Triglycerides: 98 mg/dL (ref 10.0–149.0)
VLDL: 19.6 mg/dL (ref 0.0–40.0)

## 2024-04-01 LAB — HEMOGLOBIN A1C: Hgb A1c MFr Bld: 5.7 % (ref 4.6–6.5)

## 2024-04-03 ENCOUNTER — Ambulatory Visit: Payer: Self-pay | Admitting: Internal Medicine

## 2024-04-05 ENCOUNTER — Encounter: Payer: Self-pay | Admitting: Internal Medicine

## 2024-04-05 ENCOUNTER — Ambulatory Visit: Admitting: Internal Medicine

## 2024-04-05 VITALS — BP 110/78 | HR 81 | Temp 98.2°F | Ht 62.0 in | Wt 173.0 lb

## 2024-04-05 DIAGNOSIS — E7800 Pure hypercholesterolemia, unspecified: Secondary | ICD-10-CM | POA: Diagnosis not present

## 2024-04-05 DIAGNOSIS — F439 Reaction to severe stress, unspecified: Secondary | ICD-10-CM | POA: Diagnosis not present

## 2024-04-05 DIAGNOSIS — Z0000 Encounter for general adult medical examination without abnormal findings: Secondary | ICD-10-CM | POA: Diagnosis not present

## 2024-04-05 DIAGNOSIS — E1165 Type 2 diabetes mellitus with hyperglycemia: Secondary | ICD-10-CM | POA: Diagnosis not present

## 2024-04-05 DIAGNOSIS — Z860100 Personal history of colon polyps, unspecified: Secondary | ICD-10-CM

## 2024-04-05 DIAGNOSIS — Z7984 Long term (current) use of oral hypoglycemic drugs: Secondary | ICD-10-CM | POA: Diagnosis not present

## 2024-04-05 DIAGNOSIS — Z7985 Long-term (current) use of injectable non-insulin antidiabetic drugs: Secondary | ICD-10-CM | POA: Diagnosis not present

## 2024-04-05 DIAGNOSIS — I10 Essential (primary) hypertension: Secondary | ICD-10-CM | POA: Diagnosis not present

## 2024-04-05 MED ORDER — LISINOPRIL 40 MG PO TABS: 40.0000 mg | ORAL_TABLET | Freq: Every day | ORAL | 1 refills | Status: AC

## 2024-04-05 MED ORDER — METFORMIN HCL ER 500 MG PO TB24: 500.0000 mg | ORAL_TABLET | Freq: Two times a day (BID) | ORAL | 1 refills | Status: AC

## 2024-04-05 MED ORDER — HYDROCHLOROTHIAZIDE 25 MG PO TABS: 25.0000 mg | ORAL_TABLET | Freq: Every day | ORAL | 1 refills | Status: AC

## 2024-04-05 MED ORDER — ROSUVASTATIN CALCIUM 10 MG PO TABS: 10.0000 mg | ORAL_TABLET | Freq: Every day | ORAL | 1 refills | Status: AC

## 2024-04-05 MED ORDER — PAROXETINE HCL 10 MG PO TABS: 10.0000 mg | ORAL_TABLET | Freq: Every day | ORAL | 1 refills | Status: AC

## 2024-04-05 MED ORDER — AMLODIPINE BESYLATE 10 MG PO TABS: 10.0000 mg | ORAL_TABLET | Freq: Every day | ORAL | 1 refills | Status: AC

## 2024-04-05 NOTE — Assessment & Plan Note (Signed)
 On metformin .  On Ozempic .  A1c just checked was 5.7. SABRA  She is doing well on the medication.  Continue low-carb diet and exercise. Continue to follow met b and A1c.

## 2024-04-05 NOTE — Progress Notes (Signed)
 "  Subjective:    [name], female    DOB: 26-Oct-1955, 69 y.o.   MRN: [id number]  Patient here for  Chief Complaint  Patient presents with   Medical Management of Chronic Issues   Annual Exam    HPI Here for a physical exam. Continues on metformin  and ozempic . Discussed labs recent A1c improved 5.7. triglycerides are wnl. Stays active. No chest pain or sob reported. No abdominal pain or bowel change reported. Handling stress. Stress with mother's health issues. Does not feel needs any further intervention.    Past Medical History:  Diagnosis Date   Abnormal liver function tests    Allergy    Seasonal   Anemia    H/O   Arthritis    RA   Depression    Diabetes mellitus without complication (HCC)    Fatty liver    Hypercholesterolemia    Hypertension    Migraine headache    better since menopause   Neuromuscular disorder (HCC)    carpal tunnel syndrome, surgery x2 in right   Recurrent sinusitis    allergies   Tuberculosis 1980'S   + TB TEST-WAS TREATED   Past Surgical History:  Procedure Laterality Date   BILATERAL CARPAL TUNNEL RELEASE     BREAST BIOPSY Left 05/12/2017   FRAGMENTS WITH FEATURES OF AN EARLY INTRADUCTAL PAPILLOMA WITH aprocine changes   BREAST BIOPSY Left 06/12/2017   Procedure: BREAST BIOPSY;  Surgeon: Dessa Reyes ORN, MD;  Location: [facility];  Service: General;  Laterality: Left;   BREAST DUCTAL SYSTEM EXCISION Left 06/12/2017   Procedure: EXCISION DUCTAL SYSTEM BREAST;  Surgeon: Dessa Reyes ORN, MD;  Location: [facility];  Service: General;  Laterality: Left;   BREAST EXCISIONAL BIOPSY Left 2004   x 2   COLONOSCOPY WITH PROPOFOL  N/A 02/11/2016   Procedure: COLONOSCOPY WITH PROPOFOL ;  Surgeon: Rogelia Copping, MD;  Location: [facility];  Service: Endoscopy;  Laterality: N/A;   COLONOSCOPY WITH PROPOFOL  N/A 02/18/2021   Procedure: COLONOSCOPY WITH PROPOFOL ;  Surgeon: Copping Rogelia, MD;  Location: [facility];  Service:  Endoscopy;  Laterality: N/A;   COSMETIC SURGERY  1983   rhinoplasty   EXCISION MORTON'S NEUROMA Left 06/01/2015   Procedure: EXCISION MORTON'S NEUROMA;  Surgeon: Krystal Rosella, DPM;  Location: [facility];  Service: Podiatry;  Laterality: Left;  Local w/ mac   FRACTURE SURGERY  2017   Morton's neuroma removed feft foot   OSTECTOMY Left 06/01/2015   Procedure: OSTECTOMY/ part. excision fracture fragment left 4th toe;  Surgeon: Krystal Rosella, DPM;  Location: [facility];  Service: Podiatry;  Laterality: Left;   POLYPECTOMY N/A 02/11/2016   Procedure: POLYPECTOMY;  Surgeon: Rogelia Copping, MD;  Location: [facility];  Service: Endoscopy;  Laterality: N/A;   RHINOPLASTY     TUBAL LIGATION  1997   Family History  Problem Relation Age of Onset   Heart disease Father        died age 43 (MI)   Early death Father    Heart disease Other        4 uncles - CABG   Colon cancer Other        great aunt   Heart disease Paternal Grandmother        first MI - 28s   Migraines Mother    Breast cancer Mother 59   Hypertension Mother    Hypertension Maternal Grandmother    Early death Maternal Grandmother    Diabetes Paternal Grandfather  Stroke Paternal Grandfather    Depression Maternal Aunt    Breast cancer Other        1 mat and 1 pat  gr aunt   Cancer Maternal Aunt        Breast   Cancer Paternal Aunt        Breast   Cancer Paternal Aunt        Breast   Social History   Socioeconomic History   Marital status: Married    Spouse name: Not on file   Number of children: 2   Years of education: Not on file   Highest education level: Some college, no degree  Occupational History   Not on file  Tobacco Use   Smoking status: Never   Smokeless tobacco: Never  Vaping Use   Vaping status: Never Used  Substance and Sexual Activity   Alcohol use: Yes    Alcohol/week: 1.0 standard drink of alcohol    Types: 1 Cans of beer per week    Comment: occasional   Drug use: Never   Sexual activity:  Yes    Birth control/protection: Post-menopausal, Surgical  Other Topics Concern   Not on file  Social History Narrative   Married   Works at [employer]   Tobacco Use: Low Risk (04/10/2024)   Patient History    Smoking Tobacco Use: Never    Smokeless Tobacco Use: Never    Passive Exposure: Not on file  Financial Resource Strain: Low Risk (03/14/2024)   Overall Financial Resource Strain (CARDIA)    Difficulty of Paying Living Expenses: Not hard at all  Food Insecurity: No Food Insecurity (03/14/2024)   Epic    Worried About Radiation Protection Practitioner of Food in the Last Year: Never true    Ran Out of Food in the Last Year: Never true  Transportation Needs: No Transportation Needs (03/14/2024)   Epic    Lack of Transportation (Medical): No    Lack of Transportation (Non-Medical): No  Physical Activity: Insufficiently Active (03/14/2024)   Exercise Vital Sign    Days of Exercise per Week: 2 days    Minutes of Exercise per Session: 10 min  Stress: No Stress Concern Present (03/14/2024)   [employer] of Occupational Health - Occupational Stress Questionnaire    Feeling of Stress: Not at all  Social Connections: Socially Integrated (03/14/2024)   Social Connection and Isolation Panel    Frequency of Communication with Friends and Family: Three times a week    Frequency of Social Gatherings with Friends and Family: Once a week    Attends Religious Services: More than 4 times per year    Active Member of Clubs or Organizations: Yes    Attends Banker Meetings: More than 4 times per year    Marital Status: Married  Depression (PHQ2-9): Low Risk (04/05/2024)   Depression (PHQ2-9)    PHQ-2 Score: 0  Alcohol Screen: Low Risk (03/14/2024)   Alcohol Screen    Last Alcohol Screening Score (AUDIT): 2  Housing: Low Risk (03/14/2024)   Epic    Unable to Pay for Housing in the Last Year: No    Number of Times Moved in the Last Year: 0    Homeless in the Last  Year: No  Utilities: Not At Risk (03/15/2024)   Epic    Threatened with loss of utilities: No  Health Literacy: Adequate Health Literacy (03/15/2024)   B1300 Health Literacy    Frequency of need  for help with medical instructions: Never     Review of Systems  Constitutional:  Negative for appetite change and unexpected weight change.  HENT:  Negative for congestion, sinus pressure and sore throat.   Eyes:  Negative for pain and visual disturbance.  Respiratory:  Negative for cough, chest tightness and shortness of breath.   Cardiovascular:  Negative for chest pain, palpitations and leg swelling.  Gastrointestinal:  Negative for abdominal pain, diarrhea, nausea and vomiting.  Genitourinary:  Negative for difficulty urinating and dysuria.  Musculoskeletal:  Negative for joint swelling and myalgias.  Skin:  Negative for color change and rash.  Neurological:  Negative for dizziness and headaches.  Hematological:  Negative for adenopathy. Does not bruise/bleed easily.  Psychiatric/Behavioral:  Negative for agitation and dysphoric mood.        Objective:     BP 110/78   Pulse 81   Temp 98.2 F (36.8 C) (Oral)   Ht 5' 2 (1.575 m)   Wt 173 lb (78.5 kg)   LMP 05/30/2007   SpO2 97%   BMI 31.64 kg/m  Wt Readings from Last 3 Encounters:  04/05/24 173 lb (78.5 kg)  03/15/24 170 lb (77.1 kg)  11/18/23 176 lb (79.8 kg)    Physical Exam Vitals reviewed.  Constitutional:      General: She is not in acute distress.    Appearance: Normal appearance. She is well-developed.  HENT:     Head: Normocephalic and atraumatic.     Right Ear: External ear normal.     Left Ear: External ear normal.     Mouth/Throat:     Pharynx: No oropharyngeal exudate or posterior oropharyngeal erythema.  Eyes:     General: No scleral icterus.       Right eye: No discharge.        Left eye: No discharge.     Conjunctiva/sclera: Conjunctivae normal.  Neck:     Thyroid : No thyromegaly.   Cardiovascular:     Rate and Rhythm: Normal rate and regular rhythm.  Pulmonary:     Effort: No tachypnea, accessory muscle usage or respiratory distress.     Breath sounds: Normal breath sounds. No decreased breath sounds or wheezing.  Chest:  Breasts:    Right: No inverted nipple, mass, nipple discharge or tenderness (no axillary adenopathy).     Left: No inverted nipple, mass, nipple discharge or tenderness (no axilarry adenopathy).  Abdominal:     General: Bowel sounds are normal.     Palpations: Abdomen is soft.     Tenderness: There is no abdominal tenderness.  Musculoskeletal:        General: No swelling or tenderness.     Cervical back: Neck supple.  Lymphadenopathy:     Cervical: No cervical adenopathy.  Skin:    Findings: No erythema or rash.  Neurological:     Mental Status: She is alert and oriented to person, place, and time.  Psychiatric:        Mood and Affect: Mood normal.        Behavior: Behavior normal.         Outpatient Encounter Medications as of 04/05/2024  Medication Sig   acetaminophen  (TYLENOL ) 500 MG tablet Take 1,000 mg by mouth daily as needed for moderate pain.    fexofenadine-pseudoephedrine (ALLEGRA-D) 60-120 MG 12 hr tablet Take 1 tablet by mouth daily as needed (allergies).    ibuprofen (ADVIL,MOTRIN) 200 MG tablet Take 600-800 mg by mouth daily as needed (arthritis).  loratadine (CLARITIN) 10 MG tablet Take 10 mg by mouth daily.   magnesium  oxide (MAG-OX) 400 MG tablet Take 1 tablet (400 mg total) by mouth daily. (Patient taking differently: Take 400 mg by mouth 2 (two) times daily.)   naproxen sodium (ANAPROX) 220 MG tablet Take 440 mg by mouth daily as needed (pain).    Semaglutide , 1 MG/DOSE, (OZEMPIC , 1 MG/DOSE,) 4 MG/3ML SOPN INJECT 1 MG UNDER THE SKIN ONCE A WEEK   amLODipine  (NORVASC ) 10 MG tablet Take 1 tablet (10 mg total) by mouth daily.   hydrochlorothiazide  (HYDRODIURIL ) 25 MG tablet Take 1 tablet (25 mg total) by mouth daily.    lisinopril  (ZESTRIL ) 40 MG tablet Take 1 tablet (40 mg total) by mouth daily.   metFORMIN  (GLUCOPHAGE -XR) 500 MG 24 hr tablet Take 1 tablet (500 mg total) by mouth 2 (two) times daily with a meal.   PARoxetine  (PAXIL ) 10 MG tablet Take 1 tablet (10 mg total) by mouth daily.   rosuvastatin  (CRESTOR ) 10 MG tablet Take 1 tablet (10 mg total) by mouth daily.   [DISCONTINUED] amLODipine  (NORVASC ) 10 MG tablet Take 1 tablet (10 mg total) by mouth daily.   [DISCONTINUED] hydrochlorothiazide  (HYDRODIURIL ) 25 MG tablet Take 1 tablet (25 mg total) by mouth daily.   [DISCONTINUED] lisinopril  (ZESTRIL ) 40 MG tablet Take 1 tablet (40 mg total) by mouth daily.   [DISCONTINUED] metFORMIN  (GLUCOPHAGE -XR) 500 MG 24 hr tablet Take 1 tablet (500 mg total) by mouth 2 (two) times daily with a meal.   [DISCONTINUED] PARoxetine  (PAXIL ) 10 MG tablet Take 1 tablet (10 mg total) by mouth daily.   [DISCONTINUED] rosuvastatin  (CRESTOR ) 10 MG tablet Take 1 tablet (10 mg total) by mouth daily.   No facility-administered encounter medications on file as of 04/05/2024.     Lab Results  Component Value Date   WBC 6.8 04/01/2024   HGB 13.6 04/01/2024   HCT 40.3 04/01/2024   PLT 276.0 04/01/2024   GLUCOSE 108 (H) 04/01/2024   CHOL 136 04/01/2024   TRIG 98.0 04/01/2024   HDL 46.70 04/01/2024   LDLDIRECT 130.0 07/11/2019   LDLCALC 70 04/01/2024   ALT 25 04/01/2024   AST 18 04/01/2024   NA 141 04/01/2024   K 4.5 04/01/2024   CL 103 04/01/2024   CREATININE 0.91 04/01/2024   BUN 22 04/01/2024   CO2 29 04/01/2024   TSH 3.73 07/09/2023   HGBA1C 5.7 04/01/2024   MICROALBUR 1.4 11/10/2023    MM 3D SCREENING MAMMOGRAM BILATERAL BREAST Result Date: 06/08/2023 CLINICAL DATA:  Screening. EXAM: DIGITAL SCREENING BILATERAL MAMMOGRAM WITH TOMOSYNTHESIS AND CAD TECHNIQUE: Bilateral screening digital craniocaudal and mediolateral oblique mammograms were obtained. Bilateral screening digital breast tomosynthesis was performed.  The images were evaluated with computer-aided detection. COMPARISON:  Previous exam(s). ACR Breast Density Category c: The breasts are heterogeneously dense, which may obscure small masses. FINDINGS: There are no findings suspicious for malignancy. IMPRESSION: No mammographic evidence of malignancy. A result letter of this screening mammogram will be mailed directly to the patient. RECOMMENDATION: Screening mammogram in one year. (Code:[id number]-B-01Y) BI-RADS CATEGORY  1: Negative. Electronically Signed   By: Alm Parkins M.D.   On: 06/08/2023 13:04       Assessment & Plan:  Health care maintenance Assessment & Plan: Physical today 04/05/24. PAP 01/07/22 - negative with negative HPV.  Colonoscopy 01/2021 - Dr Jinny - diverticulosis and non bleeding internal hemorrhoids.  Recommended f/u colonoscopy in 7 years.  Mammogram 05/26/22 - Birads I. Recently evaluated by Dr  Piscoya for left breast lump. Diagnostic mammogram/ultrasound - birads II.  Recommended f/u screening mammogram in 05/2023.  Plan for screening mammogram in 05/2023. F/u mammogram 06/03/23 - Birads I.    Hypercholesterolemia Assessment & Plan: Low carb diet and exercise.  Follow lipid panel and liver function tests.  Continue crestor . No change in medication.  Lab Results  Component Value Date   CHOL 136 04/01/2024   HDL 46.70 04/01/2024   LDLCALC 70 04/01/2024   LDLDIRECT 130.0 07/11/2019   TRIG 98.0 04/01/2024   CHOLHDL 3 04/01/2024     Orders: -     Hepatic function panel; Future -     Lipid panel; Future -     TSH; Future  Primary hypertension Assessment & Plan: Continue hctz, amlodipine  and lisinopril .  Follow pressures.  Follow metabolic panel. Blood pressure doing well. No change in medication today.   Orders: -     Basic metabolic panel with GFR; Future  Type 2 diabetes mellitus with hyperglycemia, without long-term current use of insulin (HCC) Assessment & Plan: On metformin .  On Ozempic .  A1c just checked was 5.7. SABRA  She  is doing well on the medication.  Continue low-carb diet and exercise. Continue to follow met b and A1c.   Orders: -     Hemoglobin A1c; Future  Stress Assessment & Plan: Increased stress with her mother's health issues. Overall appears to be handling things relatively well. Discussed. Will notify me if feels needs any further intervention.    History of colonic polyps Assessment & Plan: Had colonoscopy 01/2021 - Dr Jinny - diverticulosis and non bleeding internal hemorrhoids.  Recommended f/u colonoscopy in 7 years.     Other orders -     amLODIPine  Besylate; Take 1 tablet (10 mg total) by mouth daily.  Dispense: 90 tablet; Refill: 1 -     hydroCHLOROthiazide ; Take 1 tablet (25 mg total) by mouth daily.  Dispense: 90 tablet; Refill: 1 -     Lisinopril ; Take 1 tablet (40 mg total) by mouth daily.  Dispense: 90 tablet; Refill: 1 -     metFORMIN  HCl ER; Take 1 tablet (500 mg total) by mouth 2 (two) times daily with a meal.  Dispense: 180 tablet; Refill: 1 -     PARoxetine  HCl; Take 1 tablet (10 mg total) by mouth daily.  Dispense: 90 tablet; Refill: 1 -     Rosuvastatin  Calcium ; Take 1 tablet (10 mg total) by mouth daily.  Dispense: 90 tablet; Refill: 1     Allena Hamilton, MD "

## 2024-04-05 NOTE — Assessment & Plan Note (Signed)
 Physical today 04/05/24. PAP 01/07/22 - negative with negative HPV.  Colonoscopy 01/2021 - Dr Jinny - diverticulosis and non bleeding internal hemorrhoids.  Recommended f/u colonoscopy in 7 years.  Mammogram 05/26/22 - Birads I. Recently evaluated by Dr Desiderio for left breast lump. Diagnostic mammogram/ultrasound - birads II.  Recommended f/u screening mammogram in 05/2023.  Plan for screening mammogram in 05/2023. F/u mammogram 06/03/23 - Birads I.

## 2024-04-10 ENCOUNTER — Encounter: Payer: Self-pay | Admitting: Internal Medicine

## 2024-04-10 NOTE — Assessment & Plan Note (Signed)
 Had colonoscopy 01/2021 - Dr Servando Snare - diverticulosis and non bleeding internal hemorrhoids.  Recommended f/u colonoscopy in 7 years.

## 2024-04-10 NOTE — Assessment & Plan Note (Signed)
 Continue hctz, amlodipine  and lisinopril .  Follow pressures.  Follow metabolic panel. Blood pressure doing well. No change in medication today.

## 2024-04-10 NOTE — Assessment & Plan Note (Signed)
 Low carb diet and exercise.  Follow lipid panel and liver function tests.  Continue crestor . No change in medication.  Lab Results  Component Value Date   CHOL 136 04/01/2024   HDL 46.70 04/01/2024   LDLCALC 70 04/01/2024   LDLDIRECT 130.0 07/11/2019   TRIG 98.0 04/01/2024   CHOLHDL 3 04/01/2024

## 2024-04-10 NOTE — Assessment & Plan Note (Signed)
 Increased stress with her mother's health issues. Overall appears to be handling things relatively well. Discussed. Will notify me if feels needs any further intervention.

## 2024-04-23 ENCOUNTER — Other Ambulatory Visit: Payer: Self-pay | Admitting: Internal Medicine

## 2024-06-07 ENCOUNTER — Encounter

## 2024-08-05 ENCOUNTER — Other Ambulatory Visit

## 2024-08-09 ENCOUNTER — Ambulatory Visit: Admitting: Internal Medicine

## 2025-03-21 ENCOUNTER — Ambulatory Visit
